# Patient Record
Sex: Male | Born: 1935 | Race: White | Hispanic: No | Marital: Married | Smoking: Former smoker
Health system: Southern US, Community
[De-identification: ages and names within clinical notes are randomized; demographics above are authoritative.]

## PROBLEM LIST (undated history)

## (undated) DIAGNOSIS — N183 Chronic kidney disease, stage 3 unspecified: Secondary | ICD-10-CM

## (undated) DIAGNOSIS — M1990 Unspecified osteoarthritis, unspecified site: Secondary | ICD-10-CM

## (undated) DIAGNOSIS — I10 Essential (primary) hypertension: Secondary | ICD-10-CM

## (undated) DIAGNOSIS — I5032 Chronic diastolic (congestive) heart failure: Secondary | ICD-10-CM

## (undated) DIAGNOSIS — E119 Type 2 diabetes mellitus without complications: Secondary | ICD-10-CM

## (undated) DIAGNOSIS — E7800 Pure hypercholesterolemia, unspecified: Secondary | ICD-10-CM

## (undated) DIAGNOSIS — R079 Chest pain, unspecified: Secondary | ICD-10-CM

## (undated) DIAGNOSIS — K5790 Diverticulosis of intestine, part unspecified, without perforation or abscess without bleeding: Secondary | ICD-10-CM

## (undated) DIAGNOSIS — Z860100 Personal history of colon polyps, unspecified: Secondary | ICD-10-CM

## (undated) DIAGNOSIS — G4730 Sleep apnea, unspecified: Secondary | ICD-10-CM

## (undated) DIAGNOSIS — J449 Chronic obstructive pulmonary disease, unspecified: Secondary | ICD-10-CM

## (undated) DIAGNOSIS — D696 Thrombocytopenia, unspecified: Secondary | ICD-10-CM

## (undated) DIAGNOSIS — I480 Paroxysmal atrial fibrillation: Secondary | ICD-10-CM

## (undated) HISTORY — PX: COLONOSCOPY: SHX174

## (undated) HISTORY — DX: Personal history of colon polyps, unspecified: Z86.0100

## (undated) HISTORY — DX: Chronic kidney disease, stage 3 (moderate): N18.3

## (undated) HISTORY — DX: Chronic obstructive pulmonary disease, unspecified: J44.9

## (undated) HISTORY — PX: CATARACT EXTRACTION: SUR2

## (undated) HISTORY — DX: Chronic kidney disease, stage 3 unspecified: N18.30

## (undated) HISTORY — DX: Unspecified osteoarthritis, unspecified site: M19.90

## (undated) HISTORY — DX: Paroxysmal atrial fibrillation: I48.0

## (undated) HISTORY — DX: Thrombocytopenia, unspecified: D69.6

## (undated) HISTORY — PX: TRIGGER FINGER RELEASE: SHX641

## (undated) HISTORY — DX: Pure hypercholesterolemia, unspecified: E78.00

## (undated) HISTORY — DX: Essential (primary) hypertension: I10

## (undated) HISTORY — DX: Type 2 diabetes mellitus without complications: E11.9

## (undated) HISTORY — PX: CARDIOVERSION: SHX1299

## (undated) HISTORY — DX: Sleep apnea, unspecified: G47.30

## (undated) HISTORY — DX: Personal history of colonic polyps: Z86.010

## (undated) HISTORY — DX: Chronic diastolic (congestive) heart failure: I50.32

## (undated) HISTORY — DX: Diverticulosis of intestine, part unspecified, without perforation or abscess without bleeding: K57.90

## (undated) HISTORY — DX: Chest pain, unspecified: R07.9

---

## 1998-07-21 HISTORY — PX: BACK SURGERY: SHX140

## 1999-05-20 ENCOUNTER — Ambulatory Visit (HOSPITAL_COMMUNITY): Admission: RE | Admit: 1999-05-20 | Discharge: 1999-05-20 | Payer: Self-pay | Admitting: Unknown Physician Specialty

## 1999-05-29 ENCOUNTER — Ambulatory Visit (HOSPITAL_COMMUNITY): Admission: RE | Admit: 1999-05-29 | Discharge: 1999-05-29 | Payer: Self-pay | Admitting: Unknown Physician Specialty

## 1999-05-29 ENCOUNTER — Encounter: Payer: Self-pay | Admitting: Unknown Physician Specialty

## 2004-11-20 ENCOUNTER — Ambulatory Visit: Payer: Self-pay | Admitting: Unknown Physician Specialty

## 2004-11-21 ENCOUNTER — Ambulatory Visit: Payer: Self-pay | Admitting: Unknown Physician Specialty

## 2005-01-06 ENCOUNTER — Ambulatory Visit: Payer: Self-pay | Admitting: Internal Medicine

## 2005-01-18 ENCOUNTER — Ambulatory Visit: Payer: Self-pay | Admitting: Internal Medicine

## 2005-02-18 ENCOUNTER — Ambulatory Visit: Payer: Self-pay | Admitting: Internal Medicine

## 2005-03-21 ENCOUNTER — Ambulatory Visit: Payer: Self-pay | Admitting: Internal Medicine

## 2005-10-28 ENCOUNTER — Other Ambulatory Visit: Payer: Self-pay

## 2005-10-28 ENCOUNTER — Inpatient Hospital Stay: Payer: Self-pay | Admitting: Internal Medicine

## 2006-04-17 ENCOUNTER — Ambulatory Visit: Payer: Self-pay | Admitting: Internal Medicine

## 2007-02-24 ENCOUNTER — Ambulatory Visit: Payer: Self-pay | Admitting: Unknown Physician Specialty

## 2007-03-03 ENCOUNTER — Ambulatory Visit: Payer: Self-pay | Admitting: Unknown Physician Specialty

## 2007-03-05 ENCOUNTER — Ambulatory Visit: Payer: Self-pay | Admitting: Unknown Physician Specialty

## 2007-03-05 ENCOUNTER — Other Ambulatory Visit: Payer: Self-pay

## 2007-03-09 ENCOUNTER — Ambulatory Visit: Payer: Self-pay | Admitting: Unknown Physician Specialty

## 2007-03-15 ENCOUNTER — Ambulatory Visit: Payer: Self-pay | Admitting: Gastroenterology

## 2007-03-25 ENCOUNTER — Ambulatory Visit: Payer: Self-pay | Admitting: Anesthesiology

## 2007-04-26 ENCOUNTER — Ambulatory Visit: Payer: Self-pay | Admitting: Anesthesiology

## 2007-05-25 ENCOUNTER — Ambulatory Visit: Payer: Self-pay | Admitting: Anesthesiology

## 2007-06-24 ENCOUNTER — Ambulatory Visit: Payer: Self-pay | Admitting: Anesthesiology

## 2007-08-05 ENCOUNTER — Ambulatory Visit: Payer: Self-pay | Admitting: Anesthesiology

## 2007-09-13 ENCOUNTER — Ambulatory Visit: Payer: Self-pay | Admitting: Anesthesiology

## 2007-10-01 ENCOUNTER — Ambulatory Visit: Payer: Self-pay | Admitting: Unknown Physician Specialty

## 2007-10-18 ENCOUNTER — Ambulatory Visit: Payer: Self-pay | Admitting: Anesthesiology

## 2007-11-17 ENCOUNTER — Ambulatory Visit (HOSPITAL_COMMUNITY): Admission: RE | Admit: 2007-11-17 | Discharge: 2007-11-17 | Payer: Self-pay | Admitting: Unknown Physician Specialty

## 2007-12-07 ENCOUNTER — Ambulatory Visit: Payer: Self-pay | Admitting: Cardiology

## 2007-12-10 ENCOUNTER — Inpatient Hospital Stay (HOSPITAL_COMMUNITY): Admission: RE | Admit: 2007-12-10 | Discharge: 2007-12-18 | Payer: Self-pay | Admitting: Neurosurgery

## 2007-12-10 ENCOUNTER — Ambulatory Visit: Payer: Self-pay | Admitting: Internal Medicine

## 2007-12-15 ENCOUNTER — Encounter (INDEPENDENT_AMBULATORY_CARE_PROVIDER_SITE_OTHER): Payer: Self-pay | Admitting: Neurosurgery

## 2008-10-17 ENCOUNTER — Ambulatory Visit: Payer: Self-pay | Admitting: Internal Medicine

## 2008-11-08 ENCOUNTER — Ambulatory Visit: Payer: Self-pay | Admitting: Internal Medicine

## 2008-12-11 ENCOUNTER — Ambulatory Visit: Payer: Self-pay | Admitting: Neurosurgery

## 2009-10-02 ENCOUNTER — Ambulatory Visit: Payer: Self-pay | Admitting: Unknown Physician Specialty

## 2010-10-04 ENCOUNTER — Ambulatory Visit: Payer: Self-pay | Admitting: Unknown Physician Specialty

## 2010-12-03 NOTE — Op Note (Signed)
NAME:  [name], [name] NO.:  [id number]   MEDICAL RECORD NO.:  [id number]          PATIENT TYPE:  INP   LOCATION:  3012                         FACILITY:  [facility]   PHYSICIAN:  Coletta Memos, M.D.     DATE OF BIRTH:  1935-04-12   DATE OF PROCEDURE:  12/10/2007  DATE OF DISCHARGE:                               OPERATIVE REPORT   PREOPERATIVE DIAGNOSES:  Lumbar stenosis L4-L5, lumbar instability L4-  L5.   POSTOPERATIVE DIAGNOSES:  Lumbar stenosis L4-L5, lumbar instability L4-  L5, and lumbar spondylosis without myelopathy.   SURGERIES:  1. Posterior lumbar interbody arthrodesis, 12-mm __________  cages      packed with morselized allograft and with infuse.  2. Posterolateral arthrodesis using morselized allograft and infused.  3. Spire plate placement.   COMPLICATIONS:  None.   SURGEON:  Coletta Memos, MD   ASSISTANT:  Danae Orleans. Venetia Maxon, MD   ANESTHESIA:  General endotracheal.   INDICATIONS:  [name] is a 76 year old gentleman who presented  with severe pain in hips and legs.  He had little in the way of back  pain.  MRI showed a large mass causing rather severe spinal stenosis,  the mass, I presumed to be thickened ligament, possibly a synovial cyst.  He also had fishmouth instability on the L4-L5 interspace.  I,  therefore, recommended that he undergo decompression with possible  arthrodesis and as it turned out, he indeed needed it.  He is admitted  today for that operation.   OPERATIVE NOTE:  [name] was brought to the operating room, intubated  and placed under general anesthetic without difficulty.  A Foley  catheter was placed under sterile conditions.  He was rolled prone onto  a Jackson table and all pressure points were properly padded.  His back  was prepped, and he was draped in a sterile fashion.  I infiltrated  lidocaine 1:200,000 __________  epinephrine into the lumbar region.  I  then opened an incision with a #10 blade and took this  down to the  thoracolumbar fascia.  I then exposed the lamina of L3, L4, and L5  confirmed by intraoperative view.  I then proceeded with my semi-  hemilaminectomy of the L4 bilaterally.   I performed a decompression of the L4-L5 disk space by performing semi-  hemilaminectomies bilaterally using a high-speed drill and Kerrison  punches of L4.  The wall was very thick and partially calcified  ligamentum flavum, which was able to decompress the spinal canal.  After  having done that, I opened the disk spaces bilaterally and prepared the  disk space for arthrodesis by scraping down the soft tissue and disk  material along with some of the endplate to make sure I had very good  noncortical surfaces.  I then packed two 12-mm cages with morselized  allograft.  I placed infuse into the intervertebral disk space then  placed the cages along with more morselized allograft .   I then placed a spire plate without difficulty.  X-ray showed that the  interbody cages were in good position.  I then  irrigated the wound.  I  then closed the wound in layered fashion using Vicryl sutures.  I  reapproximated thoracolumbar subcutaneous subcuticular layers.  Dr.  Venetia Maxon assisted with the decompression and placement of the cages and  spire plate.  I then closed the wound in layered fashion.  Sterile  dressing was applied.           ______________________________  Coletta Memos, M.D.     KC/MEDQ  D:  12/10/2007  T:  12/11/2007  Job:  [id number]

## 2010-12-06 NOTE — Discharge Summary (Signed)
[name], [name] NO.:  [id number]   MEDICAL RECORD NO.:  [id number]          PATIENT TYPE:  INP   LOCATION:  3012                         FACILITY:  [facility]   PHYSICIAN:  Coletta Memos, M.D.     DATE OF BIRTH:  [birth year]   DATE OF ADMISSION:  12/10/2007  DATE OF DISCHARGE:  12/18/2007                               DISCHARGE SUMMARY   ADMITTING DIAGNOSES:  1. Lumbar spondylosis.  2. Low back pain.  3. Degenerative disk disease.  4. Lumbar stenosis L4-5.   DISCHARGE DIAGNOSES:  1. Lumbar spondylosis.  2. Low back pain.  3. Degenerative disk disease.  4. Lumbar stenosis L4-5.   PROCEDURE:  Posterior lumbar interbody arthrodesis L4-5 with Spire plate  placement and PEEK implants.   COMPLICATIONS:  Urinary retention postoperatively.   [name] was admitted and taken to the operating room for lumbar  stenosis and spondylosis along with low back pain.  He did well with  regards to his back and lower extremity pain, but developed urinary  retention while in the hospital.  That prolonged his stay.  At  discharge, we were able to remove the Foley catheter.  He was able to  void on his own.  He did have some evidence also of acute renal failure  which resolved over the course of his hospitalization with increased  hydration.  Creatinine went up to as high as 5.7.  It did decrease, and  at discharge, he was voiding without difficulty, and laboratories were  trending downwards.  He will have follow up with his internal medicine  physician with regards to this.  His wound at discharge is clean and  dry.  No signs of infection.  There was 5/5 strength in the lower  extremities.  He was given pain medication at discharge along with a  muscle relaxant.           ______________________________  Coletta Memos, M.D.     KC/MEDQ  D:  01/14/2008  T:  01/15/2008  Job:  [id number]

## 2011-04-16 LAB — BASIC METABOLIC PANEL
BUN: 12
BUN: 32 — ABNORMAL HIGH
BUN: 44 — ABNORMAL HIGH
CO2: 22
CO2: 29
Calcium: 8.2 — ABNORMAL LOW
Calcium: 8.5
Chloride: 102
Chloride: 103
Chloride: 103
Chloride: 105
Creatinine, Ser: 2.07 — ABNORMAL HIGH
Creatinine, Ser: 5.11 — ABNORMAL HIGH
GFR calc Af Amer: 12 — ABNORMAL LOW
GFR calc non Af Amer: 10 — ABNORMAL LOW
GFR calc non Af Amer: 11 — ABNORMAL LOW
GFR calc non Af Amer: 12 — ABNORMAL LOW
GFR calc non Af Amer: 60 — ABNORMAL LOW
Glucose, Bld: 165 — ABNORMAL HIGH
Glucose, Bld: 236 — ABNORMAL HIGH
Glucose, Bld: 56 — ABNORMAL LOW
Glucose, Bld: 64 — ABNORMAL LOW
Potassium: 4.3
Potassium: 4.4
Potassium: 4.8
Sodium: 131 — ABNORMAL LOW
Sodium: 138
Sodium: 139

## 2011-04-16 LAB — CBC
HCT: 26.6 — ABNORMAL LOW
HCT: 40.5
Hemoglobin: 14
Hemoglobin: 9.8 — ABNORMAL LOW
MCHC: 34.5
MCV: 89.6
MCV: 90.3
Platelets: 149 — ABNORMAL LOW
Platelets: 172
Platelets: 183
RBC: 2.94 — ABNORMAL LOW
RBC: 3.13 — ABNORMAL LOW
RDW: 13.7
RDW: 14
RDW: 14.2
WBC: 4.6
WBC: 8.5

## 2011-04-16 LAB — URINALYSIS, ROUTINE W REFLEX MICROSCOPIC
Nitrite: NEGATIVE
Specific Gravity, Urine: 1.019
Urobilinogen, UA: 1
pH: 5

## 2011-04-16 LAB — URINE MICROSCOPIC-ADD ON

## 2011-04-16 LAB — ABO/RH: ABO/RH(D): O POS

## 2011-04-16 LAB — TYPE AND SCREEN

## 2011-07-01 ENCOUNTER — Ambulatory Visit: Payer: Self-pay | Admitting: Internal Medicine

## 2011-10-21 ENCOUNTER — Ambulatory Visit: Payer: Self-pay | Admitting: Gastroenterology

## 2011-10-21 LAB — CBC WITH DIFFERENTIAL/PLATELET
Basophil #: 0 10*3/uL (ref 0.0–0.1)
Basophil %: 0.5 %
Eosinophil %: 0.9 %
HCT: 39.1 % — ABNORMAL LOW (ref 40.0–52.0)
MCV: 93 fL (ref 80–100)
Monocyte #: 0.9 10*3/uL — ABNORMAL HIGH (ref 0.0–0.7)
Monocyte %: 15.5 %
Neutrophil %: 68.9 %
Platelet: 104 10*3/uL — ABNORMAL LOW (ref 150–440)
RBC: 4.2 10*6/uL — ABNORMAL LOW (ref 4.40–5.90)
RDW: 13.5 % (ref 11.5–14.5)
WBC: 5.7 10*3/uL (ref 3.8–10.6)

## 2011-12-16 ENCOUNTER — Ambulatory Visit: Payer: Self-pay | Admitting: Oncology

## 2011-12-16 LAB — CBC CANCER CENTER
Basophil #: 0 x10 3/mm (ref 0.0–0.1)
Basophil %: 0.3 %
Lymphocyte #: 0.9 x10 3/mm — ABNORMAL LOW (ref 1.0–3.6)
Lymphocyte %: 16.1 %
MCV: 91 fL (ref 80–100)
Monocyte #: 1.2 x10 3/mm — ABNORMAL HIGH (ref 0.2–1.0)
Neutrophil #: 3.5 x10 3/mm (ref 1.4–6.5)
Platelet: 107 x10 3/mm — ABNORMAL LOW (ref 150–440)
RBC: 4.17 10*6/uL — ABNORMAL LOW (ref 4.40–5.90)
WBC: 5.8 x10 3/mm (ref 3.8–10.6)

## 2011-12-16 LAB — FERRITIN: Ferritin (ARMC): 52 ng/mL (ref 8–388)

## 2011-12-16 LAB — LACTATE DEHYDROGENASE: LDH: 285 U/L — ABNORMAL HIGH (ref 87–241)

## 2011-12-16 LAB — IRON AND TIBC
Iron Bind.Cap.(Total): 349 ug/dL (ref 250–450)
Iron Saturation: 28 %

## 2011-12-17 LAB — PROT IMMUNOELECTROPHORES(ARMC)

## 2011-12-20 ENCOUNTER — Ambulatory Visit: Payer: Self-pay | Admitting: Oncology

## 2012-02-06 ENCOUNTER — Ambulatory Visit: Payer: Self-pay | Admitting: Ophthalmology

## 2012-02-06 DIAGNOSIS — Z01810 Encounter for preprocedural cardiovascular examination: Secondary | ICD-10-CM

## 2012-02-06 LAB — CBC WITH DIFFERENTIAL/PLATELET
Basophil %: 0.6 %
Eosinophil %: 1.9 %
HGB: 12.8 g/dL — ABNORMAL LOW (ref 13.0–18.0)
Lymphocyte #: 1 10*3/uL (ref 1.0–3.6)
MCH: 32.2 pg (ref 26.0–34.0)
MCV: 93 fL (ref 80–100)
Monocyte #: 1.4 x10 3/mm — ABNORMAL HIGH (ref 0.2–1.0)
Platelet: 113 10*3/uL — ABNORMAL LOW (ref 150–440)
RBC: 3.99 10*6/uL — ABNORMAL LOW (ref 4.40–5.90)
RDW: 14.1 % (ref 11.5–14.5)

## 2012-02-06 LAB — POTASSIUM: Potassium: 3.7 mmol/L (ref 3.5–5.1)

## 2012-02-16 ENCOUNTER — Ambulatory Visit: Payer: Self-pay | Admitting: Ophthalmology

## 2012-03-16 ENCOUNTER — Ambulatory Visit: Payer: Self-pay | Admitting: Gastroenterology

## 2012-06-01 ENCOUNTER — Telehealth: Payer: Self-pay | Admitting: Ophthalmology

## 2012-06-01 NOTE — Telephone Encounter (Signed)
What meds does he need.  What is he out of.  i can work on getting him an earlier appt.

## 2012-06-01 NOTE — Telephone Encounter (Signed)
Pt is set up in February to see you he was wanting to come in sooner because he says his medication is out dated and needs to speak with you about his meds.

## 2012-06-01 NOTE — Telephone Encounter (Signed)
Called patient Dr [name]. Has been getting his meds from [place].

## 2012-06-03 NOTE — Telephone Encounter (Signed)
Need to notify pt that he needs to get the lyrica refilled by md who prescribed med - until appt with me.  Ok to send message back to [place] after pt notified - to work on earlier appt.  Will need at least 30 min.

## 2012-06-04 NOTE — Telephone Encounter (Signed)
Called patient to let him know. Patient stated that Dr. Hyacinth Meeker gave him 2 refills. He is wanting a sooner appointment if he can be worked in.

## 2012-06-21 ENCOUNTER — Emergency Department: Payer: Self-pay | Admitting: Emergency Medicine

## 2012-06-21 ENCOUNTER — Ambulatory Visit: Payer: Self-pay | Admitting: Emergency Medicine

## 2012-06-21 LAB — COMPREHENSIVE METABOLIC PANEL
Alkaline Phosphatase: 113 U/L (ref 50–136)
BUN: 18 mg/dL (ref 7–18)
Co2: 27 mmol/L (ref 21–32)
Creatinine: 1.29 mg/dL (ref 0.60–1.30)
EGFR (African American): >60
EGFR (Non-African Amer.): 54 — ABNORMAL LOW
Glucose: 206 mg/dL — ABNORMAL HIGH (ref 65–99)
Potassium: 3.7 mmol/L (ref 3.5–5.1)
SGOT(AST): 22 U/L (ref 15–37)
SGPT (ALT): 30 U/L (ref 12–78)
Sodium: 140 mmol/L (ref 136–145)

## 2012-06-21 LAB — CBC
HCT: 36.2 % — ABNORMAL LOW (ref 40.0–52.0)
HGB: 12.6 g/dL — ABNORMAL LOW (ref 13.0–18.0)
MCH: 32.2 pg (ref 26.0–34.0)
MCV: 92 fL (ref 80–100)
Platelet: 104 10*3/uL — ABNORMAL LOW (ref 150–440)
RDW: 14.2 % (ref 11.5–14.5)
WBC: 8 10*3/uL (ref 3.8–10.6)

## 2012-06-24 ENCOUNTER — Emergency Department: Payer: Self-pay | Admitting: Internal Medicine

## 2012-06-24 LAB — PRO B NATRIURETIC PEPTIDE: B-Type Natriuretic Peptide: 414 pg/mL (ref 0–450)

## 2012-06-24 LAB — CBC
HCT: 32.2 % — ABNORMAL LOW (ref 40.0–52.0)
MCH: 31.8 pg (ref 26.0–34.0)
MCHC: 34.6 g/dL (ref 32.0–36.0)
MCV: 92 fL (ref 80–100)
Platelet: 116 10*3/uL — ABNORMAL LOW (ref 150–440)
RDW: 13.7 % (ref 11.5–14.5)
WBC: 6 10*3/uL (ref 3.8–10.6)

## 2012-06-24 LAB — COMPREHENSIVE METABOLIC PANEL
Albumin: 3.3 g/dL — ABNORMAL LOW (ref 3.4–5.0)
Anion Gap: 8 (ref 7–16)
BUN: 33 mg/dL — ABNORMAL HIGH (ref 7–18)
Bilirubin,Total: 0.5 mg/dL (ref 0.2–1.0)
Creatinine: 2.11 mg/dL — ABNORMAL HIGH (ref 0.60–1.30)
Glucose: 167 mg/dL — ABNORMAL HIGH (ref 65–99)
Osmolality: 292 (ref 275–301)
Potassium: 3.8 mmol/L (ref 3.5–5.1)
Sodium: 141 mmol/L (ref 136–145)
Total Protein: 6.3 g/dL — ABNORMAL LOW (ref 6.4–8.2)

## 2012-06-24 LAB — URINALYSIS, COMPLETE
Bilirubin,UR: NEGATIVE
Hyaline Cast: 3
Leukocyte Esterase: NEGATIVE
Ph: 5 (ref 4.5–8.0)
Protein: 30
RBC,UR: 1 /HPF (ref 0–5)
Specific Gravity: 1.009 (ref 1.003–1.030)
Squamous Epithelial: NONE SEEN
WBC UR: 23 /HPF (ref 0–5)

## 2012-06-24 LAB — TROPONIN I: Troponin-I: <0.02 ng/mL

## 2012-07-29 ENCOUNTER — Other Ambulatory Visit: Payer: Self-pay | Admitting: Internal Medicine

## 2012-07-29 NOTE — Telephone Encounter (Signed)
If has been on these regularly = then ok to refill until appt

## 2012-07-29 NOTE — Telephone Encounter (Signed)
Patient hasn't been seen in our office yet, appt 09/09/2012 please advise.

## 2012-09-09 ENCOUNTER — Encounter: Payer: Self-pay | Admitting: Internal Medicine

## 2012-09-09 ENCOUNTER — Other Ambulatory Visit: Payer: Self-pay | Admitting: Internal Medicine

## 2012-09-09 ENCOUNTER — Ambulatory Visit (INDEPENDENT_AMBULATORY_CARE_PROVIDER_SITE_OTHER): Payer: Medicare Other | Admitting: Internal Medicine

## 2012-09-09 VITALS — BP 138/82 | HR 93 | Temp 97.7°F | Resp 17 | Ht 72.75 in | Wt 291.2 lb

## 2012-09-09 DIAGNOSIS — E7800 Pure hypercholesterolemia, unspecified: Secondary | ICD-10-CM

## 2012-09-09 DIAGNOSIS — R0602 Shortness of breath: Secondary | ICD-10-CM

## 2012-09-09 DIAGNOSIS — E0821 Diabetes mellitus due to underlying condition with diabetic nephropathy: Secondary | ICD-10-CM

## 2012-09-09 DIAGNOSIS — N058 Unspecified nephritic syndrome with other morphologic changes: Secondary | ICD-10-CM

## 2012-09-09 DIAGNOSIS — E119 Type 2 diabetes mellitus without complications: Secondary | ICD-10-CM

## 2012-09-09 DIAGNOSIS — D696 Thrombocytopenia, unspecified: Secondary | ICD-10-CM

## 2012-09-09 DIAGNOSIS — E1329 Other specified diabetes mellitus with other diabetic kidney complication: Secondary | ICD-10-CM

## 2012-09-09 DIAGNOSIS — I10 Essential (primary) hypertension: Secondary | ICD-10-CM

## 2012-09-09 DIAGNOSIS — I4891 Unspecified atrial fibrillation: Secondary | ICD-10-CM

## 2012-09-09 DIAGNOSIS — J441 Chronic obstructive pulmonary disease with (acute) exacerbation: Secondary | ICD-10-CM

## 2012-09-09 DIAGNOSIS — N289 Disorder of kidney and ureter, unspecified: Secondary | ICD-10-CM

## 2012-09-09 LAB — CBC WITH DIFFERENTIAL/PLATELET
Basophils Absolute: 0 10*3/uL (ref 0.0–0.1)
Eosinophils Absolute: 0.1 10*3/uL (ref 0.0–0.7)
Eosinophils Relative: 1.2 % (ref 0.0–5.0)
HCT: 36 % — ABNORMAL LOW (ref 39.0–52.0)
Lymphs Abs: 0.9 10*3/uL (ref 0.7–4.0)
MCHC: 34.3 g/dL (ref 30.0–36.0)
MCV: 93 fl (ref 78.0–100.0)
Monocytes Absolute: 1.6 10*3/uL — ABNORMAL HIGH (ref 0.1–1.0)
Neutrophils Relative %: 57.1 % (ref 43.0–77.0)
Platelets: 116 10*3/uL — ABNORMAL LOW (ref 150.0–400.0)
RDW: 15 % — ABNORMAL HIGH (ref 11.5–14.6)

## 2012-09-09 LAB — URINALYSIS, ROUTINE W REFLEX MICROSCOPIC
Bilirubin Urine: NEGATIVE
Leukocytes, UA: NEGATIVE
Nitrite: NEGATIVE
Specific Gravity, Urine: 1.015 (ref 1.000–1.030)
pH: 6 (ref 5.0–8.0)

## 2012-09-09 LAB — HEMOGLOBIN A1C: Hgb A1c MFr Bld: 8.5 % — ABNORMAL HIGH (ref 4.6–6.5)

## 2012-09-09 LAB — BASIC METABOLIC PANEL
CO2: 29 mEq/L (ref 19–32)
Chloride: 106 mEq/L (ref 96–112)
Glucose, Bld: 315 mg/dL — ABNORMAL HIGH (ref 70–99)
Sodium: 140 mEq/L (ref 135–145)

## 2012-09-09 LAB — MICROALBUMIN / CREATININE URINE RATIO: Microalb, Ur: 14.7 mg/dL — ABNORMAL HIGH (ref 0.0–1.9)

## 2012-09-09 LAB — HEPATIC FUNCTION PANEL
Albumin: 3.9 g/dL (ref 3.5–5.2)
Total Bilirubin: 0.8 mg/dL (ref 0.3–1.2)

## 2012-09-09 MED ORDER — FUROSEMIDE 20 MG PO TABS: ORAL_TABLET | ORAL | Status: DC

## 2012-09-09 MED ORDER — DILTIAZEM HCL ER COATED BEADS 120 MG PO CP24: 120.0000 mg | ORAL_CAPSULE | Freq: Every day | ORAL | Status: DC

## 2012-09-10 ENCOUNTER — Ambulatory Visit (INDEPENDENT_AMBULATORY_CARE_PROVIDER_SITE_OTHER): Payer: Medicare Other | Admitting: Cardiovascular Disease

## 2012-09-10 ENCOUNTER — Encounter: Payer: Self-pay | Admitting: Internal Medicine

## 2012-09-10 ENCOUNTER — Encounter: Payer: Self-pay | Admitting: Cardiovascular Disease

## 2012-09-10 ENCOUNTER — Other Ambulatory Visit: Payer: Self-pay | Admitting: Internal Medicine

## 2012-09-10 VITALS — BP 140/82 | HR 99 | Ht 72.0 in | Wt 290.2 lb

## 2012-09-10 DIAGNOSIS — N289 Disorder of kidney and ureter, unspecified: Secondary | ICD-10-CM

## 2012-09-10 DIAGNOSIS — E7800 Pure hypercholesterolemia, unspecified: Secondary | ICD-10-CM | POA: Insufficient documentation

## 2012-09-10 DIAGNOSIS — I10 Essential (primary) hypertension: Secondary | ICD-10-CM | POA: Insufficient documentation

## 2012-09-10 DIAGNOSIS — R0602 Shortness of breath: Secondary | ICD-10-CM

## 2012-09-10 DIAGNOSIS — D696 Thrombocytopenia, unspecified: Secondary | ICD-10-CM | POA: Insufficient documentation

## 2012-09-10 DIAGNOSIS — I4891 Unspecified atrial fibrillation: Secondary | ICD-10-CM | POA: Insufficient documentation

## 2012-09-10 DIAGNOSIS — N184 Chronic kidney disease, stage 4 (severe): Secondary | ICD-10-CM | POA: Insufficient documentation

## 2012-09-10 DIAGNOSIS — N058 Unspecified nephritic syndrome with other morphologic changes: Secondary | ICD-10-CM

## 2012-09-10 DIAGNOSIS — E0821 Diabetes mellitus due to underlying condition with diabetic nephropathy: Secondary | ICD-10-CM

## 2012-09-10 DIAGNOSIS — E1329 Other specified diabetes mellitus with other diabetic kidney complication: Secondary | ICD-10-CM

## 2012-09-10 DIAGNOSIS — J441 Chronic obstructive pulmonary disease with (acute) exacerbation: Secondary | ICD-10-CM

## 2012-09-10 DIAGNOSIS — G629 Polyneuropathy, unspecified: Secondary | ICD-10-CM

## 2012-09-10 DIAGNOSIS — G609 Hereditary and idiopathic neuropathy, unspecified: Secondary | ICD-10-CM

## 2012-09-10 DIAGNOSIS — D649 Anemia, unspecified: Secondary | ICD-10-CM

## 2012-09-10 MED ORDER — FUROSEMIDE 20 MG PO TABS: ORAL_TABLET | ORAL | Status: DC

## 2012-09-10 NOTE — Progress Notes (Signed)
Patient ID: [name], male    DOB: January 04, 1935, 78 y.o.   MRN: [id number]  HPI Comments: 78 year old male with past history of hypertension, hypercholesterolemia, diabetes with renal insufficiency, thrombocytopenia who presents by referral from Dr. Lorin Picket for new atrial fibrillation.  He reports having history of COPD, obstructive sleep apnea, on CPAP. He smoked for 25 years, stopped 30 years ago. Over the past 6-8 months he has had increasing shortness of breath, particularly bad at the end of the summer. He has had worsening edema starting at the beginning of the summer which she attributes to lyrica. He denies any cough that has been more wheezy. He has had more difficulty working at [employer] as he has to walk and lift heavy items and frequently has to rest. Rare palpitations otherwise does not appreciate any tachycardia. Weight has increased over the past several months.  EKG shows atrial fibrillation with ventricular rate 99 beats per minute Echocardiogram 5 years ago showed moderate LVH otherwise essentially normal study.   Past Medical History . Arthritis  . 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 Sleep apnea  . COPD (chronic obstructive pulmonary disease)  . Hypercholesterolemia  . Hypertension  . Thrombocytopenia  . Renal insufficiency  . Diverticulosis  . History of colon polyps     Outpatient Encounter Prescriptions as of 09/10/2012  Medication Sig Dispense Refill  . atenolol (TENORMIN) 50 MG tablet Take 1 tablet by mouth daily.      Marland Kitchen atorvastatin (LIPITOR) 20 MG tablet Take 1 tablet by mouth daily.      Marland Kitchen diltiazem (CARDIZEM CD) 120 MG 24 hr capsule Take 1 capsule (120 mg total) by mouth daily.  30 capsule  3  . escitalopram (LEXAPRO) 10 MG tablet TAKE 1/2 TABLET BY MOUTH ONCE A DAY  15 tablet  2  . furosemide (LASIX) 20 MG tablet One tablet bid  60 tablet  3  . glyBURIDE (DIABETA) 5 MG tablet Take 5 mg by mouth 2 (two) times daily with a meal.        . HUMALOG 100 UNIT/ML injection Take 26 units in the am and 20 units in the pm.      . JANUVIA 100 MG tablet TAKE 1 TABLET BY MOUTH ONCE A DAY  30 tablet  0  . LANTUS 100 UNIT/ML injection Take 26 units in the am and 46 units in the pm.      . levalbuterol (XOPENEX HFA) 45 MCG/ACT inhaler Inhale 2 puffs into the lungs every 4 (four) hours as needed for wheezing.      . Multiple Vitamin (MULTIVITAMIN) tablet Take 1 tablet by mouth daily.      Marland Kitchen omeprazole (PRILOSEC) 20 MG capsule Take 20 mg by mouth daily.      . pregabalin (LYRICA) 75 MG capsule Take 75 mg by mouth 2 (two) times daily.      . Tamsulosin HCl (FLOMAX) 0.4 MG CAPS Take 0.4 mg by mouth daily.         Review of Systems  Constitutional: Positive for unexpected weight change.  HENT: Negative.   Eyes: Negative.   Respiratory: Positive for shortness of breath.   Cardiovascular: Positive for palpitations and leg swelling.  Gastrointestinal: Negative.   Musculoskeletal: Negative.   Skin: Negative.   Neurological: Negative.   Psychiatric/Behavioral: Negative.   All other systems reviewed and are negative.    BP 140/82  Pulse 99  Ht 6' (1.829 m)  [employer]  290 lb 4 oz (131.657 kg)  BMI 39.36 kg/m2  Physical Exam  Nursing note and vitals reviewed. Constitutional: He is oriented to person, place, and time. He appears well-developed and well-nourished.  obese  HENT:  Head: Normocephalic.  Nose: Nose normal.  Mouth/Throat: Oropharynx is clear and moist.  Eyes: Conjunctivae are normal. Pupils are equal, round, and reactive to light.  Neck: Normal range of motion. Neck supple. No JVD present.  Cardiovascular: Normal rate, S1 normal, S2 normal, normal heart sounds and intact distal pulses.  An irregularly irregular rhythm present. Exam reveals no gallop and no friction rub.   No murmur heard. Pulmonary/Chest: Effort normal and breath sounds normal. No respiratory distress. He has no wheezes. He has no rales. He exhibits no  tenderness.  Abdominal: Soft. Bowel sounds are normal. He exhibits no distension. There is no tenderness.  Musculoskeletal: Normal range of motion. He exhibits no edema and no tenderness.  Lymphadenopathy:    He has no cervical adenopathy.  Neurological: He is alert and oriented to person, place, and time. Coordination normal.  Skin: Skin is warm and dry. No rash noted. No erythema.  Psychiatric: He has a normal mood and affect. His behavior is normal. Judgment and thought content normal.      Assessment and Plan

## 2012-09-10 NOTE — Progress Notes (Signed)
Order placed for follow up labs 

## 2012-09-10 NOTE — Assessment & Plan Note (Signed)
Has a history of thrombocytopenia.  Recheck cbc today.  Will have to obtain records from [place] to review previous work up.

## 2012-09-10 NOTE — Assessment & Plan Note (Signed)
Blood pressure has been under good control.  Starting on diltiazem and lasix as outlined.  Will hold hctz and lisinopril.  Follow pressures.  Will add back lisinopril as blood pressure tolerated - especially given his underlying renal insufficiency and history of diabetes.

## 2012-09-10 NOTE — Assessment & Plan Note (Signed)
New onset afib.  Probably has been in afib for a while.  Has had progressive weight gain and SOB/DOE.  Ventricular rate resting - approximately 100.  Will continue Atenolol.  Will hold on increasing the atenolol - given his underlying lung issues.  Has COPD.  Will add diltiazem 120mg  q day.  Start lasix 20mg  bid.  Will hold hctz and lisinopril for now.  Monitor blood pressure closely.  Start xarelto 20mg  q day.  Cardiology (Dr Mariah Milling) agreed to work pt in tomorrow.  Pt aware of appt.  Further cardiac w/up per Dr Windell Hummingbird assessment.  Pt comfortable with this plan.  Knows if he has any worsening problems, he is to be reevaluated immediately.

## 2012-09-10 NOTE — Assessment & Plan Note (Signed)
I suspect he has been in atrial fibrillation for some time given worsening shortness of breath and edema for months. We have suggested he stay on anticoagulation, xarelto 20 mg daily. We have increased the Lasix to 20 mg twice a day for his pitting edema and shortness of breath. We will continue on diltiazem 120 mg daily, continue atenolol 50 mg daily. Rate continues to be around 100 beats per minute at rest. I've asked him to monitor his heart rate while at work and at home. If rate continues to be elevated, we'll probably recommend increasing atenolol to 50 mg twice a day.

## 2012-09-10 NOTE — Assessment & Plan Note (Signed)
On simvastatin.  Check liver panel today.  Has eaten.  Will check lipid profile with next fasting labs. Low cholesterol diet and exercise.

## 2012-09-10 NOTE — Progress Notes (Signed)
  Subjective:    Patient ID: [name], male    DOB: [birth year], 78 y.o.   MRN: [id number]  HPI 78 year old male with past history of hypertension, hypercholesterolemia, diabetes with renal insufficiency (previously followed by Dr Hyacinth Meeker) and thrombocytopenia who comes in today for a scheduled follow up.  I last saw him at [facility] (4/14).  He reports he has gained weight.  Weight previously 255-260 pounds.  Reports increased swelling.  States this started after starting Lyrica.  Had peripheral neuropathy.  Previously experienced increased burning and stinging of his feet. Since starting the lyrica - essentially resolved.  He continues to work.  Stands on his feet all day.  If he walks any distance, reports increased sob.  Has previously seen Dr Meredeth Ide.  Has a history of documented COPD.  Has not kept follow up with Dr Meredeth Ide - stating he is afraid he will put him on oxygen.  Denies any chest pain.  No increased heart racing or palpitations.  Reports blood sugars averaging 90-140 in the am and 130-140 in the pm.  Brought in no recorded sugar readings.  States blood pressure averaging 120-130/70s.  No bowel change.    Past Medical History  Diagnosis Date  . Arthritis   . 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 Sleep apnea   . COPD (chronic obstructive pulmonary disease)   . Hypercholesterolemia   . Hypertension   . Thrombocytopenia   . Renal insufficiency   . Diverticulosis   . History of colon polyps     Review of Systems Patient denies any headache, lightheadedness or dizziness.  No sinus or allergy symptoms.  No chest pain or palpitations.  No increased heart rate.   No cough or congestion.  Does report increased sob and windedness with minimal exertion.   No nausea or vomiting.  No abdominal pain or cramping.  No bowel change, such as diarrhea, constipation, BRBPR or melana.  No urine change.   The stinging and burning of his feet improved.  Still seeing Dr  Franky Macho for his back.  Had MRI.       Objective:   Physical Exam Filed Vitals:   09/09/12 1158  BP: 138/82  Pulse: 93  Temp: 97.7 F (36.5 C)  Resp: 82   78 year old male in no acute distress.  HEENT:  Nares - clear.  Oropharynx - without lesions. NECK:  Supple.  Nontender.  No audible carotid bruit.  HEART:  Appears irregularly irregular.  Rate controlled.    LUNGS:  No crackles or wheezing audible.  Respirations even and unlabored.   RADIAL PULSE:  Equal bilaterally.  ABDOMEN:  Soft.  Nontender.  Bowel sounds present and normal.  No audible abdominal bruit.    EXTREMITIES:  Some edema noted.  DP pulses palpable and equal bilaterally.  SKIN:  No lesions.        Assessment & Plan:  MSK.  Stable.    HEALTH MAINTENANCE.  Last physical 10/30/11.  Prostate and PSAs through Dr Achilles Dunk.  Colonoscopy 10/21/11 - diverticulosis and several polyps.    I spent over one hour with this patient.  More than 50% of the time was spent in consultation regarding the above.

## 2012-09-10 NOTE — Assessment & Plan Note (Addendum)
Recent creatinine 1.4. Echocardiogram pending to evaluate his right ventricular systolic pressures inside diuresis. For now we'll increase Lasix to 20 mg twice a day.he would prefer to eat a banana rather than a potassium pill.

## 2012-09-10 NOTE — Assessment & Plan Note (Signed)
Sugars as outlined.  Brought in no recorded readings.  Check metabolic panel and a1c.  Check urinalysis and urine microalbumin/cr ratio.  Same medications for now.

## 2012-09-10 NOTE — Assessment & Plan Note (Signed)
Documented history of COPD.   Has seen Dr Meredeth Ide.  Has not kept follow up.  Obtain records.  Treat underlying heart issues as outlined.

## 2012-09-10 NOTE — Assessment & Plan Note (Signed)
We have encouraged continued exercise, careful diet management in an effort to lose weight. 

## 2012-09-10 NOTE — Assessment & Plan Note (Signed)
Unable to exclude COPD exacerbation given underlying wheezing. He does have Xopenex inhaler. Suspect some heart failure, diastolic CHF from underlying atrial fibrillation could be exacerbating symptoms.

## 2012-09-10 NOTE — Assessment & Plan Note (Signed)
Baseline creatinine around 1.4.  Check metabolic panel today.  Avoid antiinflammatories and other nephrotoxic agents.

## 2012-09-10 NOTE — Patient Instructions (Addendum)
Please take lasix 20 mg twice a day with potassium  Continue to take diltiazem and atenolol for heart rate control Continue on xarelto one a day  In four weeks , we can consider options to convert the rhythm back to normal rhythm   We will schedule an echo for atrial fibrillation  Please call us if you have new issues that need to be addressed before your next appt.  Your physician wants you to follow-up in: 1 months.

## 2012-09-10 NOTE — Assessment & Plan Note (Signed)
We'll monitor his blood pressure on his current medications.

## 2012-09-14 ENCOUNTER — Other Ambulatory Visit: Payer: Self-pay

## 2012-09-14 ENCOUNTER — Other Ambulatory Visit: Payer: Self-pay | Admitting: *Deleted

## 2012-09-14 ENCOUNTER — Other Ambulatory Visit (INDEPENDENT_AMBULATORY_CARE_PROVIDER_SITE_OTHER): Payer: Medicare Other

## 2012-09-14 DIAGNOSIS — I4891 Unspecified atrial fibrillation: Secondary | ICD-10-CM

## 2012-09-14 DIAGNOSIS — R0602 Shortness of breath: Secondary | ICD-10-CM

## 2012-09-14 MED ORDER — ATENOLOL 50 MG PO TABS: 50.0000 mg | ORAL_TABLET | Freq: Every day | ORAL | Status: DC

## 2012-09-16 ENCOUNTER — Other Ambulatory Visit: Payer: Self-pay | Admitting: Internal Medicine

## 2012-09-17 ENCOUNTER — Telehealth: Payer: Self-pay | Admitting: *Deleted

## 2012-09-17 MED ORDER — GLYBURIDE 5 MG PO TABS: 5.0000 mg | ORAL_TABLET | Freq: Two times a day (BID) | ORAL | Status: DC

## 2012-09-17 NOTE — Telephone Encounter (Signed)
Needs refill for Glyburide 5 mg tab take 2 tablets by mouth 2 daily, [facility] [place]

## 2012-09-17 NOTE — Telephone Encounter (Signed)
Sent in to pharmacy.  

## 2012-09-18 ENCOUNTER — Telehealth: Payer: Self-pay | Admitting: Internal Medicine

## 2012-09-18 MED ORDER — GLYBURIDE 5 MG PO TABS: ORAL_TABLET | ORAL | Status: DC

## 2012-09-18 NOTE — Telephone Encounter (Signed)
Corrected dosage. 2 tablets bid.  Clarified with pharmacy.

## 2012-09-21 ENCOUNTER — Telehealth: Payer: Self-pay | Admitting: *Deleted

## 2012-09-21 NOTE — Telephone Encounter (Signed)
Please review echo results Tachycardic during test

## 2012-09-21 NOTE — Telephone Encounter (Signed)
Pt calling for test results.

## 2012-09-28 ENCOUNTER — Telehealth: Payer: Self-pay | Admitting: *Deleted

## 2012-09-28 NOTE — Telephone Encounter (Signed)
Refill Request  Lisinopril 40 mg  #90  Take 1 tablet by mouth once a day for blood pressure

## 2012-09-28 NOTE — Telephone Encounter (Signed)
Normal LV function Moderately dilated left atrium which will make it difficult to get him out of atrial fibrillation  If heart rate continues to be elevated, would increase atenolol to 50 mg twice a day Stay on xarelto Daily Could consider starting a medication for cardioversion at the end of March Could set up an appointment to talk about this

## 2012-09-29 ENCOUNTER — Other Ambulatory Visit: Payer: Self-pay

## 2012-09-29 ENCOUNTER — Telehealth: Payer: Self-pay

## 2012-09-29 MED ORDER — RIVAROXABAN 20 MG PO TABS: 20.0000 mg | ORAL_TABLET | Freq: Every day | ORAL | Status: DC

## 2012-09-29 NOTE — Telephone Encounter (Addendum)
Called patient concerning fax we received from pharmacy requesting lisinopril. Patient has been taken off med for now. Left message for patient to return call.

## 2012-09-29 NOTE — Telephone Encounter (Signed)
I discussed with Dr. Mariah Milling pt's fluid retention (see telephone note) issues r/t weight gain, edema and worsening sob He gave orders as follows: "Increase lasix to 40 mg BID until weight and other symptoms improve. May take atenolol 50 mg in am or 25 mg BID, whichever is easiest for pt" VO Dr. Alvis Lemmings, RN  Pt informed Understanding verb I will call to reassess Friday 3/14

## 2012-09-29 NOTE — Telephone Encounter (Signed)
Pt informed He has appt with [place] 3/25 to discuss further He confirms compliance with Xarelto, needs samples. I will leave these at FD  He says HR=90-102 daily He takes atenolol 25 mg once daily I advised ok to take BID He will try this  He goes on to tell me his home weight has increased to 293 pounds (usual home weight=270) He c/o worsening sob and edema Is taking lasix 20 mg BID I advised to try taking 40 mg in am and 20 mg in pm over the next few days to see if this helps his edema and sob He will try this I will call him tomm to reassess

## 2012-09-30 ENCOUNTER — Telehealth: Payer: Self-pay

## 2012-09-30 DIAGNOSIS — I4891 Unspecified atrial fibrillation: Secondary | ICD-10-CM

## 2012-09-30 MED ORDER — FUROSEMIDE 20 MG PO TABS: ORAL_TABLET | ORAL | Status: DC

## 2012-09-30 MED ORDER — ATENOLOL 50 MG PO TABS: 50.0000 mg | ORAL_TABLET | Freq: Every day | ORAL | Status: DC

## 2012-09-30 NOTE — Telephone Encounter (Signed)
Per the patient's wife, he is out now. I have left a message for him to call.

## 2012-09-30 NOTE — Telephone Encounter (Signed)
I spoke with the patient. He was needing refills on both his lasix and his atenolol. I advised the increase on his lasix was for 40 mg BID until his weight is back to normal, then he is to resume his current dose at 20 mg BID. He would like to take atenolol 50 mg once daily. [facility] [place]. The patient also mentioned something about a refill on his HCTZ. I advised I do not see this on his medication list. He states he may not actually be taking this, but will review his medications.

## 2012-09-30 NOTE — Telephone Encounter (Signed)
Patient stated that he has been taken off lisinopril for now. He does not need refill st this time.

## 2012-09-30 NOTE — Telephone Encounter (Signed)
Pt would like someone to call him about his medications. States his dosage has been increased and would like to discuss this with a nurse.

## 2012-10-01 ENCOUNTER — Telehealth: Payer: Self-pay

## 2012-10-01 NOTE — Telephone Encounter (Signed)
I called pt to assess weight, edema and sob since increasing lasix He says weight is down 5 pounds since the change Wheezing has improved and edema has decreased Normal home weight=270 pounds This am he weighs 288 pounds I advised to continue the extra lasix until weight gets a little lower than may resume previous dosing I will call to reassess Monday 3/17 Understanding verb

## 2012-10-01 NOTE — Telephone Encounter (Signed)
Assess edema and weight, sob

## 2012-10-05 ENCOUNTER — Telehealth: Payer: Self-pay | Admitting: Internal Medicine

## 2012-10-05 NOTE — Telephone Encounter (Signed)
Elease Hashimoto is needing information for Mr. Leedy medical leave . Nothing is mention about his February medical leave in the doctor's notes . Please help.

## 2012-10-07 NOTE — Telephone Encounter (Signed)
Patient returned your phone call Marcelino Duster..[phone]

## 2012-10-07 NOTE — Telephone Encounter (Signed)
Called patient to get more info about medical leave left message to return call.

## 2012-10-08 ENCOUNTER — Other Ambulatory Visit: Payer: Self-pay | Admitting: *Deleted

## 2012-10-08 ENCOUNTER — Telehealth: Payer: Self-pay | Admitting: *Deleted

## 2012-10-08 NOTE — Telephone Encounter (Signed)
Patient said that the cardiac physician he sees has taken care of medical leave. He does not need Dr. Lorin Picket to sign one now.

## 2012-10-08 NOTE — Telephone Encounter (Signed)
Patient needs referral to [facility] for his hip (joint).

## 2012-10-11 MED ORDER — LEVALBUTEROL TARTRATE 45 MCG/ACT IN AERO: 2.0000 | INHALATION_SPRAY | RESPIRATORY_TRACT | Status: DC | PRN

## 2012-10-11 NOTE — Telephone Encounter (Signed)
Sent in to pharmacy.  

## 2012-10-12 ENCOUNTER — Other Ambulatory Visit (INDEPENDENT_AMBULATORY_CARE_PROVIDER_SITE_OTHER): Payer: Medicare Other

## 2012-10-12 ENCOUNTER — Ambulatory Visit (INDEPENDENT_AMBULATORY_CARE_PROVIDER_SITE_OTHER): Payer: Medicare Other | Admitting: Cardiovascular Disease

## 2012-10-12 ENCOUNTER — Encounter: Payer: Self-pay | Admitting: Cardiovascular Disease

## 2012-10-12 ENCOUNTER — Telehealth: Payer: Self-pay | Admitting: Internal Medicine

## 2012-10-12 VITALS — BP 132/80 | HR 101 | Ht 72.0 in | Wt 285.8 lb

## 2012-10-12 DIAGNOSIS — I10 Essential (primary) hypertension: Secondary | ICD-10-CM

## 2012-10-12 DIAGNOSIS — G609 Hereditary and idiopathic neuropathy, unspecified: Secondary | ICD-10-CM

## 2012-10-12 DIAGNOSIS — E7800 Pure hypercholesterolemia, unspecified: Secondary | ICD-10-CM

## 2012-10-12 DIAGNOSIS — D696 Thrombocytopenia, unspecified: Secondary | ICD-10-CM

## 2012-10-12 DIAGNOSIS — I5030 Unspecified diastolic (congestive) heart failure: Secondary | ICD-10-CM

## 2012-10-12 DIAGNOSIS — I4891 Unspecified atrial fibrillation: Secondary | ICD-10-CM

## 2012-10-12 DIAGNOSIS — I509 Heart failure, unspecified: Secondary | ICD-10-CM

## 2012-10-12 DIAGNOSIS — M542 Cervicalgia: Secondary | ICD-10-CM

## 2012-10-12 DIAGNOSIS — N289 Disorder of kidney and ureter, unspecified: Secondary | ICD-10-CM

## 2012-10-12 DIAGNOSIS — D649 Anemia, unspecified: Secondary | ICD-10-CM

## 2012-10-12 MED ORDER — RIVAROXABAN 20 MG PO TABS: 20.0000 mg | ORAL_TABLET | Freq: Every day | ORAL | Status: DC

## 2012-10-12 MED ORDER — AMIODARONE HCL 400 MG PO TABS: 400.0000 mg | ORAL_TABLET | Freq: Every day | ORAL | Status: DC

## 2012-10-12 NOTE — Telephone Encounter (Signed)
Pt came in wanting to get a referral for neck pain. Pt would like to go with [place] [facility] is possbile  Pt also had labs today pt made appointment with dr Lorin Picket for 4/1

## 2012-10-12 NOTE — Progress Notes (Signed)
Patient ID: [name], male    DOB: 1934/10/18, 78 y.o.   MRN: [id number]  HPI Comments: 78 year old male with past history of hypertension, hypercholesterolemia, diabetes with renal insufficiency, thrombocytopenia who initially presented from  Dr. Lorin Picket for new atrial fibrillation. He reports having a history of COPD, obstructive sleep apnea, on CPAP. He smoked for 25 years, stopped 30 years ago. On his initial clinic visit, he was started on rate control medications and diuretic for significant lower extremity edema and shortness of breath. Also started on anticoagulation.  Last visit was one month ago. Since that time he has had approximately 20 pound weight loss. Significant improvement in his lower trimmed edema in his breathing. Heart rate has also improved. He is much happier apart from severe neck pain. He reports having an episode of neck pain previously that seemed to resolve after an MRI. Then he developed hip pain and was seeking care from an orthopedic specialist. This now seems to be getting better and his neck is now hurting him again.  He reports being compliant with his CPAP  EKG shows atrial fibrillation with ventricular rate 101 beats per minute Echocardiogram recently shows normal ejection fraction with moderately dilated left atrium, high normal right ventricular systolic pressure  Past Medical History . Arthritis  . 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 Sleep apnea  . COPD (chronic obstructive pulmonary disease)  . Hypercholesterolemia  . Hypertension  . Thrombocytopenia  . Renal insufficiency  . Diverticulosis  . History of colon polyps     Outpatient Encounter Prescriptions as of 10/12/2012  Medication Sig Dispense Refill  . atenolol (TENORMIN) 50 MG tablet Take 1 tablet (50 mg total) by mouth daily.  30 tablet  6  . atorvastatin (LIPITOR) 20 MG tablet Take 1 tablet by mouth daily.      Marland Kitchen diltiazem (CARDIZEM CD) 120 MG 24 hr capsule  Take 1 capsule (120 mg total) by mouth daily.  30 capsule  3  . escitalopram (LEXAPRO) 10 MG tablet TAKE 1/2 TABLET BY MOUTH ONCE A DAY  15 tablet  2  . furosemide (LASIX) 20 MG tablet One tablet bid  60 tablet  6  . glyBURIDE (DIABETA) 5 MG tablet 2 tablets bid  120 tablet  5  . HUMALOG 100 UNIT/ML injection Take 26 units in the am and 20 units in the pm.      . JANUVIA 100 MG tablet TAKE 1 TABLET BY MOUTH ONCE A DAY  30 tablet  0  . JANUVIA 100 MG tablet TAKE 1 TABLET BY MOUTH ONCE A DAY  30 tablet  0  . LANTUS 100 UNIT/ML injection Take 26 units in the am and 46 units in the pm.      . levalbuterol (XOPENEX HFA) 45 MCG/ACT inhaler Inhale 2 puffs into the lungs every 4 (four) hours as needed for wheezing.  1 Inhaler  5  . Multiple Vitamin (MULTIVITAMIN) tablet Take 1 tablet by mouth daily.      Marland Kitchen omeprazole (PRILOSEC) 20 MG capsule Take 20 mg by mouth daily.      . pregabalin (LYRICA) 75 MG capsule Take 75 mg by mouth 2 (two) times daily.      . Rivaroxaban (XARELTO) 20 MG TABS Take 1 tablet (20 mg total) by mouth daily.  20 tablet  6  . Tamsulosin HCl (FLOMAX) 0.4 MG CAPS Take 0.4 mg by mouth daily.  Review of Systems  HENT: Negative.   Eyes: Negative.   Respiratory: Positive for shortness of breath.   Cardiovascular: Positive for leg swelling.  Gastrointestinal: Negative.   Musculoskeletal: Negative.   Skin: Negative.   Neurological: Negative.   Psychiatric/Behavioral: Negative.   All other systems reviewed and are negative.    BP 132/80  Pulse 101  Ht 6' (1.829 m)  Wt 285 lb 12 oz (129.615 kg)  BMI 38.75 kg/m2  Physical Exam  Nursing note and vitals reviewed. Constitutional: He is oriented to person, place, and time. He appears well-developed and well-nourished.  obese  HENT:  Head: Normocephalic.  Nose: Nose normal.  Mouth/Throat: Oropharynx is clear and moist.  Eyes: Conjunctivae are normal. Pupils are equal, round, and reactive to light.  Neck: Normal range  of motion. Neck supple. No JVD present.  Cardiovascular: S1 normal, S2 normal, normal heart sounds and intact distal pulses.  An irregularly irregular rhythm present. Tachycardia present.  Exam reveals no gallop and no friction rub.   No murmur heard. Pulmonary/Chest: Effort normal and breath sounds normal. No respiratory distress. He has no wheezes. He has no rales. He exhibits no tenderness.  Abdominal: Soft. Bowel sounds are normal. He exhibits no distension. There is no tenderness.  Musculoskeletal: Normal range of motion. He exhibits no edema and no tenderness.  Lymphadenopathy:    He has no cervical adenopathy.  Neurological: He is alert and oriented to person, place, and time. Coordination normal.  Skin: Skin is warm and dry. No rash noted. No erythema.  Psychiatric: He has a normal mood and affect. His behavior is normal. Judgment and thought content normal.      Assessment and Plan

## 2012-10-12 NOTE — Assessment & Plan Note (Signed)
We will check a basic metabolic panel. If this is not done by Dr. Lorin Picket, we will add this in 2 weeks' time.

## 2012-10-12 NOTE — Assessment & Plan Note (Signed)
Blood pressure is well controlled on today's visit. No changes made to the medications. 

## 2012-10-12 NOTE — Assessment & Plan Note (Signed)
We will start him on amiodarone 400 mg twice a day for 5 days then decrease the dose to 200 mg twice a day weight EKG in 2 weeks' time. If he continues to be in atrial fibrillation, we will schedule a cardioversion. We have suggested that he stay on his current Lasix regimen. He is losing weight in a slow steady manner. I suspect based on his exam, he continues to have edema from diastolic heart failure.

## 2012-10-12 NOTE — Assessment & Plan Note (Signed)
Improved diastolic CHF, still mild fluid overload. We'll continue current diuretic use. We'll need to check basic metabolic panel

## 2012-10-12 NOTE — Patient Instructions (Addendum)
Please take your evening lasix at 2 pm  Please start amiodarone 400 mg twice a day for 5 days Then decrease to 200 mg twice a day EKG in 2 weeks If you are still in atrial fibrillation at that time, we will schedule a cardioversion at that time   See if Dr. Lorin Picket will check your kidney function, potassium  Please call us if you have new issues that need to be addressed before your next appt.  Your physician wants you to follow-up in: 1 months.

## 2012-10-13 ENCOUNTER — Telehealth: Payer: Self-pay | Admitting: *Deleted

## 2012-10-13 ENCOUNTER — Ambulatory Visit: Payer: Medicare Other | Admitting: Internal Medicine

## 2012-10-13 LAB — LIPID PANEL
HDL: 33 mg/dL — ABNORMAL LOW (ref >39.00)
Triglycerides: 286 mg/dL — ABNORMAL HIGH (ref 0.0–149.0)
VLDL: 57.2 mg/dL — ABNORMAL HIGH (ref 0.0–40.0)

## 2012-10-13 LAB — CBC WITH DIFFERENTIAL/PLATELET
Basophils Absolute: 0 10*3/uL (ref 0.0–0.1)
Eosinophils Absolute: 0.1 10*3/uL (ref 0.0–0.7)
Lymphocytes Relative: 10.6 % — ABNORMAL LOW (ref 12.0–46.0)
MCHC: 33.7 g/dL (ref 30.0–36.0)
Neutrophils Relative %: 63.9 % (ref 43.0–77.0)
Platelets: 142 10*3/uL — ABNORMAL LOW (ref 150.0–400.0)
RBC: 4.08 Mil/uL — ABNORMAL LOW (ref 4.22–5.81)
RDW: 13.7 % (ref 11.5–14.6)

## 2012-10-13 LAB — IBC PANEL
Iron: 48 ug/dL (ref 42–165)
Transferrin: 241.3 mg/dL (ref 212.0–360.0)

## 2012-10-13 LAB — FERRITIN: Ferritin: 79.2 ng/mL (ref 22.0–322.0)

## 2012-10-13 NOTE — Telephone Encounter (Signed)
Refill Request  Humalog 100 units/ML vial  #20  Inject 24 units at breakfast 22 units as lunch and 14 units at supper

## 2012-10-13 NOTE — Telephone Encounter (Signed)
Order placed for ortho referral as pt requested.  Amber will be calling him with an appt.

## 2012-10-14 MED ORDER — INSULIN LISPRO 100 UNIT/ML ~~LOC~~ SOLN: SUBCUTANEOUS | Status: DC

## 2012-10-14 NOTE — Telephone Encounter (Signed)
Rx sent in to pharmacy. 

## 2012-10-15 ENCOUNTER — Telehealth: Payer: Self-pay | Admitting: Internal Medicine

## 2012-10-15 NOTE — Telephone Encounter (Signed)
Please block the 8:15 spot on 10/19/12.  (pt needs 30 min).  Thanks.

## 2012-10-15 NOTE — Telephone Encounter (Signed)
Made pt appointment 30 min per dr Lorin Picket

## 2012-10-19 ENCOUNTER — Ambulatory Visit (INDEPENDENT_AMBULATORY_CARE_PROVIDER_SITE_OTHER): Payer: Medicare Other | Admitting: Internal Medicine

## 2012-10-19 ENCOUNTER — Encounter: Payer: Self-pay | Admitting: Internal Medicine

## 2012-10-19 VITALS — BP 130/64 | HR 71 | Temp 98.0°F | Ht 72.75 in | Wt 274.5 lb

## 2012-10-19 DIAGNOSIS — E0821 Diabetes mellitus due to underlying condition with diabetic nephropathy: Secondary | ICD-10-CM

## 2012-10-19 DIAGNOSIS — D696 Thrombocytopenia, unspecified: Secondary | ICD-10-CM

## 2012-10-19 DIAGNOSIS — N289 Disorder of kidney and ureter, unspecified: Secondary | ICD-10-CM

## 2012-10-19 DIAGNOSIS — J441 Chronic obstructive pulmonary disease with (acute) exacerbation: Secondary | ICD-10-CM

## 2012-10-19 DIAGNOSIS — I5030 Unspecified diastolic (congestive) heart failure: Secondary | ICD-10-CM

## 2012-10-19 DIAGNOSIS — I4891 Unspecified atrial fibrillation: Secondary | ICD-10-CM

## 2012-10-19 DIAGNOSIS — M542 Cervicalgia: Secondary | ICD-10-CM

## 2012-10-19 DIAGNOSIS — I509 Heart failure, unspecified: Secondary | ICD-10-CM

## 2012-10-19 DIAGNOSIS — E7800 Pure hypercholesterolemia, unspecified: Secondary | ICD-10-CM

## 2012-10-19 DIAGNOSIS — E1329 Other specified diabetes mellitus with other diabetic kidney complication: Secondary | ICD-10-CM

## 2012-10-19 DIAGNOSIS — N058 Unspecified nephritic syndrome with other morphologic changes: Secondary | ICD-10-CM

## 2012-10-19 DIAGNOSIS — I10 Essential (primary) hypertension: Secondary | ICD-10-CM

## 2012-10-19 LAB — SEDIMENTATION RATE: Sed Rate: 36 mm/hr — ABNORMAL HIGH (ref 0–22)

## 2012-10-19 LAB — BASIC METABOLIC PANEL
BUN: 28 mg/dL — ABNORMAL HIGH (ref 6–23)
GFR: 39.97 mL/min — ABNORMAL LOW (ref >60.00)
Potassium: 3.8 mEq/L (ref 3.5–5.1)
Sodium: 139 mEq/L (ref 135–145)

## 2012-10-20 ENCOUNTER — Other Ambulatory Visit: Payer: Self-pay | Admitting: Internal Medicine

## 2012-10-20 DIAGNOSIS — N289 Disorder of kidney and ureter, unspecified: Secondary | ICD-10-CM

## 2012-10-20 NOTE — Progress Notes (Signed)
Order placed for follow up met b 

## 2012-10-21 ENCOUNTER — Other Ambulatory Visit: Payer: Self-pay | Admitting: Internal Medicine

## 2012-10-22 ENCOUNTER — Other Ambulatory Visit (INDEPENDENT_AMBULATORY_CARE_PROVIDER_SITE_OTHER): Payer: Medicare Other

## 2012-10-22 ENCOUNTER — Other Ambulatory Visit: Payer: Self-pay | Admitting: Internal Medicine

## 2012-10-22 DIAGNOSIS — E7800 Pure hypercholesterolemia, unspecified: Secondary | ICD-10-CM

## 2012-10-22 DIAGNOSIS — N289 Disorder of kidney and ureter, unspecified: Secondary | ICD-10-CM

## 2012-10-22 LAB — LIPID PANEL
HDL: 35.1 mg/dL — ABNORMAL LOW (ref >39.00)
Triglycerides: 183 mg/dL — ABNORMAL HIGH (ref 0.0–149.0)

## 2012-10-22 LAB — BASIC METABOLIC PANEL
CO2: 32 mEq/L (ref 19–32)
Calcium: 8.9 mg/dL (ref 8.4–10.5)
Glucose, Bld: 235 mg/dL — ABNORMAL HIGH (ref 70–99)
Sodium: 140 mEq/L (ref 135–145)

## 2012-10-22 NOTE — Telephone Encounter (Signed)
pregabalin (LYRICA) 75 MG capsule #60  atorvastatin (LIPITOR) 20 MG tablet #90

## 2012-10-25 ENCOUNTER — Telehealth: Payer: Self-pay | Admitting: *Deleted

## 2012-10-25 ENCOUNTER — Encounter: Payer: Self-pay | Admitting: Internal Medicine

## 2012-10-25 MED ORDER — PREGABALIN 75 MG PO CAPS: 75.0000 mg | ORAL_CAPSULE | Freq: Two times a day (BID) | ORAL | Status: DC

## 2012-10-25 MED ORDER — ATORVASTATIN CALCIUM 20 MG PO TABS: 20.0000 mg | ORAL_TABLET | Freq: Every day | ORAL | Status: DC

## 2012-10-25 NOTE — Addendum Note (Signed)
Addended by: Marlene Lard on: 10/25/2012 05:03 PM   Modules accepted: Orders, Medications

## 2012-10-25 NOTE — Assessment & Plan Note (Signed)
Has a history of thrombocytopenia.  Last platelet count improved.  Follow.

## 2012-10-25 NOTE — Assessment & Plan Note (Signed)
On simvastatin.  Will check lipid profile with next fasting labs. Low cholesterol diet and exercise.         

## 2012-10-25 NOTE — Telephone Encounter (Signed)
Discontinued med order.

## 2012-10-25 NOTE — Telephone Encounter (Signed)
Rx sent in to pharmacy. 

## 2012-10-25 NOTE — Progress Notes (Signed)
Subjective:    Patient ID: [name], male    DOB: March 05, 1935, 78 y.o.   MRN: [id number]  Neck Pain   78 year old male with past history of hypertension, hypercholesterolemia, diabetes with renal insufficiency (previously followed by Dr Hyacinth Meeker) and thrombocytopenia who comes in today as a work in to discuss his neck and shoulder pain.  When I last saw him, he was found to be in afib.  Started on lasix.  Also on cardizem and atenolol for rate control.  States his is breathing much better.  Energy is better.  Is not eating right.  Sugars are out of control.  Brought in no recorded sugar readings.  States am sugars are varying.  Can range from 90-230.  The 230 - he did not take his insulin the night before. States on average his am sugars are 130-150.  Does not check his sugar in the evening.  Discussed the need to start checking his sugars more regularly and record.  He also needs to eat regular meals.  May only eat two meals per day.  He is having some problems with his neck and shoulders.  Increased pain that then extends into his head.  Hurts to turn his head.  Limited rom.  Hip is better.  He states he has seen ENT recently and had negative CT.  Also, apparently had a MRI.  Dr Franky Macho apparently reviewed.  Has a "cyst".  Need to obtain MRI to review.  No chest pain or tightness.  No nausea or vomiting.  Swelling better.     Past Medical History  Diagnosis Date  . Arthritis   . Sleep apnea   . COPD (chronic obstructive pulmonary disease)   . Hypercholesterolemia   . Hypertension   . Thrombocytopenia   . Renal insufficiency   . Diverticulosis   . History of colon polyps   . 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   Patient denies any lightheadedness or dizziness.  Does report the head pain - which he relates coming from his neck.  No sinus or allergy symptoms.  No chest pain or palpitations.  No increased  heart rate.   No cough or congestion.   Breathing is better.  Energy better.  No nausea or vomiting.  No abdominal pain or cramping.  No bowel change, such as diarrhea, constipation, BRBPR or melana.  No urine change.   The stinging and burning of his feet improved.  Still seeing Dr Franky Macho for his back.  Had MRI.  Need results.  Hip better.  Increased pain in his neck, shoulders and extending into his head.       Objective:   Physical Exam  Filed Vitals:   10/19/12 0802  BP: 130/64  Pulse: 71  Temp: 98 F (36.7 C)   Blood pressure recheck:  4/73  78 year old male in no acute distress.  HEENT:  Nares - clear.  Oropharynx - without lesions. NECK:  Supple.  Nontender.  No audible carotid bruit.  HEART:  Appears irregularly irregular.  Rate controlled.    LUNGS:  No crackles or wheezing audible.  Respirations even and unlabored.   RADIAL PULSE:  Equal bilaterally.  ABDOMEN:  Soft.  Nontender.  Bowel sounds present and normal.  No audible abdominal bruit.    EXTREMITIES:  Swelling improved.   DP pulses palpable and equal  bilaterally.  SKIN:  No lesions.    MSK:  Limited rom with rotation of head from right to left.       Assessment & Plan:  MSK.  Hip pain has improved.  Now with the neck and shoulder pain.  Tylenol as directed.  He request referral to ortho.  Obtain results of scan.  Will check ESR to confirm normal.      HEALTH MAINTENANCE.  Last physical 10/30/11.  Prostate and PSAs through Dr Achilles Dunk.  Colonoscopy 10/21/11 - diverticulosis and several polyps.

## 2012-10-25 NOTE — Assessment & Plan Note (Signed)
Breathing better.  Swelling improved.  Same med regimen.

## 2012-10-25 NOTE — Assessment & Plan Note (Signed)
Documented history of COPD.   Has seen Dr Meredeth Ide.  Has not kept follow up.  Obtain records.  Treat underlying heart issues as outlined.  Breathing better.

## 2012-10-25 NOTE — Telephone Encounter (Signed)
Refill Request  Hydrochlorothiazide 25 mg tab   #90  Take one tablet by mouth once a day

## 2012-10-25 NOTE — Telephone Encounter (Signed)
Someone else has been prescribing the lyrica (pregabalin).  I am ok to refill the atorvastatin.  Which rx has been sent to pharmacy.  Need to cancel the lyrica if sent in

## 2012-10-25 NOTE — Assessment & Plan Note (Signed)
Baseline creatinine around 1.4.  Check metabolic panel today.  Avoid antiinflammatories and other nephrotoxic agents.

## 2012-10-25 NOTE — Assessment & Plan Note (Signed)
New onset afib.  Remains on Atenolol.   Diltiazem was added last visit.   Also on lasix 20mg  bid.  Blood pressure better.  Breathing better.  Weight is coming down.  Decreased swelling.  On xarelto 20mg  q day.  Seeing Dr Mariah Milling.  See his note for details.  Check metabolic panel.  Hold on making changes at this time.  Follow.

## 2012-10-25 NOTE — Assessment & Plan Note (Signed)
Sugars as outlined.  Brought in no recorded readings.  Not recording sugars and not watching his diet.  Discussed the importance of checking sugars regularly and recording.  Needs to eat regular meals and appropriate snacks.  Have him record sugars and get him back in here soon to reassess.  Will need to adjust medication.  No longer seeing Dr Hyacinth Meeker.  A1c recently checked - 8.5.

## 2012-10-25 NOTE — Assessment & Plan Note (Signed)
Blood pressure better.  Follow. Check metabolic panel.

## 2012-10-26 ENCOUNTER — Ambulatory Visit (INDEPENDENT_AMBULATORY_CARE_PROVIDER_SITE_OTHER): Payer: Medicare Other

## 2012-10-26 VITALS — BP 108/58 | HR 70 | Ht 73.0 in | Wt 290.5 lb

## 2012-10-26 DIAGNOSIS — I4891 Unspecified atrial fibrillation: Secondary | ICD-10-CM

## 2012-10-26 NOTE — Progress Notes (Signed)
Pt here for EKG post amiodarone start EKG shows pt to still be in atrial fib with a rate of 70 BPM Reviewed with Dr. Mariah Milling.   "schedule patient for cardioversion" VO Dr. Alvis Lemmings, RN  Verbal instructions given to pt [id number] tomm understanding verb

## 2012-10-27 ENCOUNTER — Ambulatory Visit: Payer: Self-pay | Admitting: Cardiovascular Disease

## 2012-10-27 ENCOUNTER — Other Ambulatory Visit: Payer: Self-pay | Admitting: Internal Medicine

## 2012-10-27 DIAGNOSIS — N289 Disorder of kidney and ureter, unspecified: Secondary | ICD-10-CM

## 2012-10-27 DIAGNOSIS — I4891 Unspecified atrial fibrillation: Secondary | ICD-10-CM

## 2012-10-27 NOTE — Progress Notes (Signed)
Order placed for follow up met b

## 2012-10-27 NOTE — Telephone Encounter (Signed)
Received medication refill for patient and from what i see she is not taking the medication anymore.. Please advise

## 2012-10-27 NOTE — Telephone Encounter (Signed)
He should be off the hctz and on lasix. This was changed at his visit before last visit.  Also I am going to send you a lab message on this pt.   Please confirm with him that he is not taking both lasix and hctz.

## 2012-10-28 NOTE — Telephone Encounter (Addendum)
Patient was taking both hctz and lasix together. Advised patient to not take hctz as was discussed with Dr. Lorin Picket his last visit. Lab results also given to patient. Patient said that he is going on vacation next week and that he will call when he gets back in to make his non fasting lab appointment.

## 2012-11-01 ENCOUNTER — Other Ambulatory Visit: Payer: Self-pay | Admitting: Internal Medicine

## 2012-11-08 ENCOUNTER — Other Ambulatory Visit (INDEPENDENT_AMBULATORY_CARE_PROVIDER_SITE_OTHER): Payer: Medicare Other

## 2012-11-08 DIAGNOSIS — N289 Disorder of kidney and ureter, unspecified: Secondary | ICD-10-CM

## 2012-11-09 LAB — BASIC METABOLIC PANEL
BUN: 18 mg/dL (ref 6–23)
Calcium: 8.6 mg/dL (ref 8.4–10.5)
Creatinine, Ser: 1.5 mg/dL (ref 0.4–1.5)
GFR: 46.92 mL/min — ABNORMAL LOW (ref >60.00)
Glucose, Bld: 68 mg/dL — ABNORMAL LOW (ref 70–99)

## 2012-11-12 ENCOUNTER — Ambulatory Visit (INDEPENDENT_AMBULATORY_CARE_PROVIDER_SITE_OTHER): Payer: Medicare Other | Admitting: Cardiovascular Disease

## 2012-11-12 ENCOUNTER — Encounter: Payer: Self-pay | Admitting: Cardiovascular Disease

## 2012-11-12 VITALS — BP 158/62 | HR 58 | Ht 72.0 in | Wt 303.5 lb

## 2012-11-12 DIAGNOSIS — R609 Edema, unspecified: Secondary | ICD-10-CM

## 2012-11-12 DIAGNOSIS — J441 Chronic obstructive pulmonary disease with (acute) exacerbation: Secondary | ICD-10-CM

## 2012-11-12 DIAGNOSIS — I4891 Unspecified atrial fibrillation: Secondary | ICD-10-CM

## 2012-11-12 DIAGNOSIS — R600 Localized edema: Secondary | ICD-10-CM | POA: Insufficient documentation

## 2012-11-12 DIAGNOSIS — J449 Chronic obstructive pulmonary disease, unspecified: Secondary | ICD-10-CM

## 2012-11-12 DIAGNOSIS — I509 Heart failure, unspecified: Secondary | ICD-10-CM

## 2012-11-12 DIAGNOSIS — N058 Unspecified nephritic syndrome with other morphologic changes: Secondary | ICD-10-CM

## 2012-11-12 DIAGNOSIS — E0821 Diabetes mellitus due to underlying condition with diabetic nephropathy: Secondary | ICD-10-CM

## 2012-11-12 DIAGNOSIS — I10 Essential (primary) hypertension: Secondary | ICD-10-CM

## 2012-11-12 DIAGNOSIS — E1329 Other specified diabetes mellitus with other diabetic kidney complication: Secondary | ICD-10-CM

## 2012-11-12 DIAGNOSIS — I5030 Unspecified diastolic (congestive) heart failure: Secondary | ICD-10-CM

## 2012-11-12 MED ORDER — CLONIDINE HCL 0.1 MG PO TABS: 0.1000 mg | ORAL_TABLET | Freq: Two times a day (BID) | ORAL | Status: DC

## 2012-11-12 MED ORDER — FUROSEMIDE 40 MG PO TABS: 40.0000 mg | ORAL_TABLET | Freq: Two times a day (BID) | ORAL | Status: DC | PRN

## 2012-11-12 MED ORDER — BUDESONIDE-FORMOTEROL FUMARATE 160-4.5 MCG/ACT IN AERO: 2.0000 | INHALATION_SPRAY | Freq: Two times a day (BID) | RESPIRATORY_TRACT | Status: DC

## 2012-11-12 NOTE — Progress Notes (Signed)
Patient ID: [name], male    DOB: [birth year], 78 y.o.   MRN: [id number]  HPI Comments: 78 year old male with past history of hypertension, hypercholesterolemia, diabetes with renal insufficiency, thrombocytopenia who initially presented from  Dr. Lorin Picket for new atrial fibrillation.  history of COPD, obstructive sleep apnea, on CPAP.  smoked for 25 years, stopped 30 years ago. On his initial clinic visit, he was started on rate control medications and diuretic for significant lower extremity edema and shortness of breath. Also started on anticoagulation.  On his last clinic visit, amiodarone was started. This did not convert him to normal sinus rhythm and he underwent elective cardioversion on April 9 214 which was successful. During the procedure, he bit he right side of his tongue but this is now healing well. he reports that his breathing has been very poor recently, weight has been trending upward, edema is worse. Significant expiratory wheezing. He is taking Xopenex inhaler periodically, twice per day   he reports blood pressure has been high at home and on recent trips to the orthopedic physician   Problems with his right knee, recently had a cortisone shot. Sugars have been higher.  History of  severe neck pain. He reports having an episode of neck pain previously that seemed to resolve after an MRI. Then he developed hip pain and was seeking care from an orthopedic specialist.   He reports being compliant with his CPAP  Echocardiogram recently shows normal ejection fraction with moderately dilated left atrium, high normal right ventricular systolic pressure  Recent lab work showing creatinine 1.5, prior to this 1.8   EKG today shows normal sinus rhythm with rate 58 beats per minute, no other significant ST or T wave changes   Past Medical History . Arthritis  . 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 Sleep apnea  . COPD (chronic obstructive pulmonary  disease)  . Hypercholesterolemia  . Hypertension  . Thrombocytopenia  . Renal insufficiency  . Diverticulosis  . History of colon polyps     Outpatient Encounter Prescriptions as of 11/12/2012  Medication Sig Dispense Refill  . amiodarone (PACERONE) 400 MG tablet Take 400 mg by mouth daily.       Marland Kitchen atenolol (TENORMIN) 50 MG tablet Take 1 tablet (50 mg total) by mouth daily.  30 tablet  6  . atorvastatin (LIPITOR) 20 MG tablet Take 1 tablet (20 mg total) by mouth daily.  30 tablet  5  . diltiazem (CARDIZEM CD) 120 MG 24 hr capsule Take 1 capsule (120 mg total) by mouth daily.  30 capsule  3  . escitalopram (LEXAPRO) 10 MG tablet TAKE 1/2 TABLET BY MOUTH ONCE A DAY  15 tablet  2  . glyBURIDE (DIABETA) 5 MG tablet Takes 2 tablets am and 2 tablets pm daily.      . insulin lispro (HUMALOG) 100 UNIT/ML injection Take 26 units in the am and 20 units in the pm.  10 mL  5  . JANUVIA 100 MG tablet TAKE 1 TABLET BY MOUTH ONCE A DAY  30 tablet  0  . LANTUS 100 UNIT/ML injection Take 26 units in the am and 46 units in the pm.      . levalbuterol (XOPENEX HFA) 45 MCG/ACT inhaler Inhale 2 puffs into the lungs every 4 (four) hours as needed for wheezing.  1 Inhaler  5  . Multiple Vitamin (MULTIVITAMIN) tablet Take 1 tablet by mouth daily.      Marland Kitchen  omeprazole (PRILOSEC) 20 MG capsule Take 20 mg by mouth daily.      . Rivaroxaban (XARELTO) 20 MG TABS Take 1 tablet (20 mg total) by mouth daily.  20 tablet  6  . Tamsulosin HCl (FLOMAX) 0.4 MG CAPS Take 0.4 mg by mouth daily.       .  furosemide (LASIX) 20 MG tablet One tablet bid,  takes 40 mg in the morning   60 tablet  6  . pregabalin (LYRICA) 75 MG capsule Take 1 capsule (75 mg total) by mouth 2 (two) times daily.        Review of Systems  HENT: Negative.   Eyes: Negative.   Respiratory: Positive for shortness of breath and wheezing.   Cardiovascular: Positive for leg swelling.  Gastrointestinal: Negative.   Musculoskeletal: Negative.   Skin:  Negative.   Neurological: Negative.   Psychiatric/Behavioral: Negative.   All other systems reviewed and are negative.    BP 158/62  Pulse 58  Ht 6' (1.829 m)  Wt 303 lb 8 oz (137.667 kg)  BMI 41.15 kg/m2  SpO2 98%  Physical Exam  Nursing note and vitals reviewed. Constitutional: He is oriented to person, place, and time. He appears well-developed and well-nourished.  Morbidly obese  HENT:  Head: Normocephalic.  Nose: Nose normal.  Mouth/Throat: Oropharynx is clear and moist.  Eyes: Conjunctivae are normal. Pupils are equal, round, and reactive to light.  Neck: Normal range of motion. Neck supple. No JVD present.  Cardiovascular: S1 normal, S2 normal, normal heart sounds and intact distal pulses.  An irregularly irregular rhythm present. Tachycardia present.  Exam reveals no gallop and no friction rub.   No murmur heard. Pulmonary/Chest: Effort normal and breath sounds normal. No respiratory distress. He has no wheezes. He has no rales. He exhibits no tenderness.  Abdominal: Soft. Bowel sounds are normal. He exhibits no distension. There is no tenderness.  Musculoskeletal: Normal range of motion. He exhibits no edema and no tenderness.  Lymphadenopathy:    He has no cervical adenopathy.  Neurological: He is alert and oriented to person, place, and time. Coordination normal.  Skin: Skin is warm and dry. No rash noted. No erythema.  Psychiatric: He has a normal mood and affect. His behavior is normal. Judgment and thought content normal.      Assessment and Plan

## 2012-11-12 NOTE — Patient Instructions (Addendum)
Please stay on amiodarone 400 mg in the am When breathing gets better, please cut the amiodarone down to 200 mg in the AM daily  Please take extra lasix 40 mg after lunch as needed (every other day), for edema or worsening shortness of breath.  Start symbicort two puffs twice a day for shortness of breath/COPD  Please start clonidine 0.1 mg twice a day for blood pressure  Please call us if you have new issues that need to be addressed before your next appt.  Your physician wants you to follow-up in:1 month.    You have been referred to - Dr. Kendrick Fries in Pulmonary- Monday 11/29/12 at 10:30 am (if you need to reschedule call- [phone])                                              [facility]                                              [address]

## 2012-11-12 NOTE — Assessment & Plan Note (Signed)
Very short of breath today concerning for COPD exacerbation. He reports  Xopenex is not helping . We will prescribe Symbicort 2 puffs twice a day and encouraged him to watch his dietary intake as his weight is rising steadily contributing to his shortness of breath . He is unable to fit into his pants . At his request, we will refer him back to Dr. Roby Lofts office to see pulmonary . He does have 30 year smoking history. Suspect shortness breath is multifactorial including his tremendous obesity, deconditioning, COPD, diastolic CHF .

## 2012-11-12 NOTE — Assessment & Plan Note (Signed)
After cardioversion, he is maintaining normal sinus rhythm on today's visit. We'll continue on amiodarone 400 mg daily Ideally I would like to decrease the dose that he is tremendously short of breath and wheezy on today's visit at high risk of converting back to atrial fibrillation. Hopefully we can decrease the dose of amiodarone down to 200 mg daily once his breathing improves in the next several weeks.

## 2012-11-12 NOTE — Assessment & Plan Note (Signed)
We have suggested he continue on Lasix 40 mg in the morning, taking additional Lasix 40 mg after lunch possibly every other day given his worsening shortness of breath and weight gain of 13 pounds since February 2014. For symptom relief, we may need to run his creatinine slightly high. One month ago it was 1.8, now 1.5. Hopefully we can keep it in this range. Suspect most of his shortness of breath could be coming from his obesity and COPD.

## 2012-11-12 NOTE — Assessment & Plan Note (Signed)
Blood pressure is high today. We'll add clonidine 0.1 mg twice a day. Would avoid ACE inhibitors, ARB is given his renal dysfunction. Avoid increasing the dose of calcium channel blockers given his edema.

## 2012-11-12 NOTE — Assessment & Plan Note (Signed)
Lower extremity edema noted. Possible small contribution from diastolic CHF. Most likely venous insufficiency exacerbated by calcium channel blocker use. I'm hesitant to hold the calcium channel blocker and increase his beta blockers in the setting of significant wheezing on today's visit. Could make this transition at a later date.

## 2012-11-12 NOTE — Assessment & Plan Note (Signed)
We have encouraged continued exercise, careful diet management in an effort to lose weight. 

## 2012-11-19 ENCOUNTER — Encounter: Payer: Self-pay | Admitting: Cardiovascular Disease

## 2012-11-22 ENCOUNTER — Other Ambulatory Visit: Payer: Self-pay

## 2012-11-22 ENCOUNTER — Telehealth: Payer: Self-pay

## 2012-11-22 DIAGNOSIS — I4891 Unspecified atrial fibrillation: Secondary | ICD-10-CM

## 2012-11-22 MED ORDER — DILTIAZEM HCL 30 MG PO TABS: 30.0000 mg | ORAL_TABLET | Freq: Two times a day (BID) | ORAL | Status: DC

## 2012-11-22 MED ORDER — ATENOLOL 50 MG PO TABS: 50.0000 mg | ORAL_TABLET | Freq: Two times a day (BID) | ORAL | Status: DC

## 2012-11-22 NOTE — Telephone Encounter (Signed)
Pt informed Understanding verb RX sent to pharmacy

## 2012-11-22 NOTE — Telephone Encounter (Signed)
edema

## 2012-11-22 NOTE — Telephone Encounter (Signed)
Pt reports edema is unchanged, still c/o significant edema in LE Sob improved Weight down 2-3 pounds on lasix 40 mg BID through w/e  I told him I would talk with Dr. Mariah Milling about the edema and see if there are any other changes we need to make May have to go back to original lasix dosing but I will check with Dr. Mariah Milling and call him back Understanding verb

## 2012-11-22 NOTE — Telephone Encounter (Signed)
I discussed with Dr. Mariah Milling who suggests "decrease diltiazem to 30 mg BID/TID (whichever pt can handle) and increasing atenolol to 50 mg BID to see if this helps edema" V.O. Dr. Alvis Lemmings, RN

## 2012-11-26 ENCOUNTER — Encounter: Payer: Self-pay | Admitting: Cardiovascular Disease

## 2012-11-29 ENCOUNTER — Ambulatory Visit (INDEPENDENT_AMBULATORY_CARE_PROVIDER_SITE_OTHER)
Admission: RE | Admit: 2012-11-29 | Discharge: 2012-11-29 | Disposition: A | Discharge status: Home / Self Care | Payer: Medicare Other | Source: Ambulatory Visit | Attending: Pulmonary Disease | Admitting: Pulmonary Disease

## 2012-11-29 ENCOUNTER — Ambulatory Visit (INDEPENDENT_AMBULATORY_CARE_PROVIDER_SITE_OTHER): Payer: Medicare Other | Admitting: Pulmonary Disease

## 2012-11-29 ENCOUNTER — Encounter: Payer: Self-pay | Admitting: Pulmonary Disease

## 2012-11-29 VITALS — BP 130/72 | HR 82 | Temp 98.4°F | Ht 73.0 in | Wt 298.0 lb

## 2012-11-29 DIAGNOSIS — R0602 Shortness of breath: Secondary | ICD-10-CM | POA: Insufficient documentation

## 2012-11-29 DIAGNOSIS — J449 Chronic obstructive pulmonary disease, unspecified: Secondary | ICD-10-CM

## 2012-11-29 DIAGNOSIS — I509 Heart failure, unspecified: Secondary | ICD-10-CM

## 2012-11-29 DIAGNOSIS — I4891 Unspecified atrial fibrillation: Secondary | ICD-10-CM

## 2012-11-29 DIAGNOSIS — I5030 Unspecified diastolic (congestive) heart failure: Secondary | ICD-10-CM

## 2012-11-29 DIAGNOSIS — R609 Edema, unspecified: Secondary | ICD-10-CM

## 2012-11-29 DIAGNOSIS — G4733 Obstructive sleep apnea (adult) (pediatric): Secondary | ICD-10-CM

## 2012-11-29 MED ORDER — FUROSEMIDE 40 MG PO TABS: 60.0000 mg | ORAL_TABLET | Freq: Two times a day (BID) | ORAL | Status: DC | PRN

## 2012-11-29 NOTE — Assessment & Plan Note (Signed)
Though our spirometry testing today is not acceptable by ATS criteria, [name] smoking history, obstruction on flow volume loops available, and response to Symbicort tell me that a diagnosis of COPD with chronic bronchitis is likely.  Plan: -continue symbicort -pulmonary rehab referral -CXR -add albuterol as needed -albuterol breathing treatment given today for dyspnea -all immunizations are up to date

## 2012-11-29 NOTE — Assessment & Plan Note (Signed)
Continue CPAP every night. 

## 2012-11-29 NOTE — Assessment & Plan Note (Signed)
He is volume up on my exam today and reports that he does not have to urinate for several hours after taking lasix.  Plan: -increase dose of lasix to 60mg  bid until leg swelling better, weight down by 8-10 lbs. -then use lasix 60mg  daily

## 2012-11-29 NOTE — Patient Instructions (Signed)
We will have you get a Chest X-ray at [facility] today to evaluate your shortness of breath We will get records from Dr. Lillette Boxer office  Take 60mg  lasix twice a day until you see improvement in your leg swelling. Then take 60mg  daily  We will have you get blood work for your potassium level in one week  We will refer you to pulmonary rehab  Keep taking your symbicort as you are doing  You can use an albuterol inhaler (we will prescribe today) as needed for shortness of breath  Exercise regularly and try to loose weight  We will see you back in 2 months or sooner if needed

## 2012-11-29 NOTE — Addendum Note (Signed)
Addended by: Caryl Ada on: 11/29/2012 02:07 PM   Modules accepted: Orders

## 2012-11-29 NOTE — Progress Notes (Signed)
Subjective:    Patient ID: [name], male    DOB: Sep 05, 1934, 79 y.o.   MRN: [id number]  HPI This is a 78 year old male with COPD, atrial fibrillation, congestive heart failure, and obesity who comes to our clinic today to establish care for COPD. He says that he had a normal childhood without respiratory illnesses. However unfortunately he started smoking one pack of cigarettes daily as a teenager and smoked for 34 years until he quit in 1986. Approximately 10-12 years ago he was diagnosed with COPD when he went for a visit to a pulmonologist at the [place] clinic. He was treated then with a breathing treatment and intermittent inhalers and says that his symptoms went away and so he never came back.  However in the last several years he has had increasing shortness of breath with just minimal exertion. This is associated with right knee pain and generalized weakness. Recently, he saw Dr. Mariah Milling for atrial fibrillation. Since starting amiodarone and Xarelto he says that his dyspnea has improved. Also, Dr. Mariah Milling started him on Symbicort which also helped with his shortness of breath.  He reports chronic daily mucus production which is yellow in color. Since starting the Symbicort he has seen a significant improvement in his mucus production. Now he only coughs up mucus once or twice a day. He states that he still gets dyspneic with just walking from the parking lot into our office. However, this has improved on Symbicort.  He states that his leg swelling has been worse in the last several months. He attributes this to multiple medications which she is taking. He states that his weight is up by about 10 pounds in his legs are swollen all the way to his thighs. When he takes furosemide it does not cause him to urinate for several hours. He is currently taking that drug twice a day.  He does not exercise regularly.  He has obstructive sleep apnea and uses CPAP every night at bedtime.  Past  Medical History  Diagnosis Date  . Arthritis   . Sleep apnea   . COPD (chronic obstructive pulmonary disease)   . Hypercholesterolemia   . Hypertension   . Thrombocytopenia   . Renal insufficiency   . Diverticulosis   . History of colon polyps   . 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 Arthritis Mother   . Heart disease Mother   . Hypertension Mother   . Diabetes Mother   . Arthritis Father   . Cancer Father     prostate  . Diabetes Sister   . Heart disease Maternal Grandmother   . Diabetes Maternal Grandmother   . Heart disease Maternal Grandfather      History   Social History  . Marital Status: Married    Spouse Name: N/A    Number of Children: N/A  . Years of Education: N/A   Occupational History  . Not on file.   Social History Main Topics  . Smoking status: Former Smoker -- 1.00 packs/day for 25 years    Types: Cigarettes    Quit date: 07/21/1984  . Smokeless tobacco: Never Used  . Alcohol Use: No  . Drug Use: No  . Sexually Active: Not on file   Other Topics Concern  . Not on file   Social History Narrative  . No narrative on file  Allergies  Allergen Reactions  . Oxycodone   . Tramadol      Outpatient Prescriptions Prior to Visit  Medication Sig Dispense Refill  . amiodarone (PACERONE) 400 MG tablet Take 400 mg by mouth daily.       Marland Kitchen atenolol (TENORMIN) 50 MG tablet Take 1 tablet (50 mg total) by mouth 2 (two) times daily.  30 tablet  6  . atorvastatin (LIPITOR) 20 MG tablet Take 1 tablet (20 mg total) by mouth daily.  30 tablet  5  . budesonide-formoterol (SYMBICORT) 160-4.5 MCG/ACT inhaler Inhale 2 puffs into the lungs 2 (two) times daily.  1 Inhaler  12  . cloNIDine (CATAPRES) 0.1 MG tablet Take 1 tablet (0.1 mg total) by mouth 2 (two) times daily.  60 tablet  11  . diltiazem (CARDIZEM) 30 MG tablet Take 1 tablet (30 mg total) by mouth 2 (two) times  daily.  60 tablet  3  . escitalopram (LEXAPRO) 10 MG tablet TAKE 1/2 TABLET BY MOUTH ONCE A DAY  15 tablet  2  . furosemide (LASIX) 40 MG tablet Take 1 tablet (40 mg total) by mouth 2 (two) times daily as needed.  60 tablet  6  . glyBURIDE (DIABETA) 5 MG tablet Takes 2 tablets am and 2 tablets pm daily.      . insulin lispro (HUMALOG) 100 UNIT/ML injection Take 26 units in the am and 20 units in the pm.  10 mL  5  . JANUVIA 100 MG tablet TAKE 1 TABLET BY MOUTH ONCE A DAY  30 tablet  0  . LANTUS 100 UNIT/ML injection Take 26 units in the am and 46 units in the pm.      . levalbuterol (XOPENEX HFA) 45 MCG/ACT inhaler Inhale 2 puffs into the lungs every 4 (four) hours as needed for wheezing.  1 Inhaler  5  . Multiple Vitamin (MULTIVITAMIN) tablet Take 1 tablet by mouth daily.      Marland Kitchen omeprazole (PRILOSEC) 20 MG capsule Take 20 mg by mouth daily.      . pregabalin (LYRICA) 75 MG capsule Take 1 capsule (75 mg total) by mouth 2 (two) times daily.      . Rivaroxaban (XARELTO) 20 MG TABS Take 1 tablet (20 mg total) by mouth daily.  20 tablet  6  . Tamsulosin HCl (FLOMAX) 0.4 MG CAPS Take 0.4 mg by mouth daily.        No facility-administered medications prior to visit.      Review of Systems  Constitutional: Negative for fever and unexpected weight change.  HENT: Positive for ear pain, congestion, rhinorrhea and postnasal drip. Negative for nosebleeds, sore throat, sneezing, trouble swallowing, dental problem and sinus pressure.   Eyes: Negative for redness and itching.  Respiratory: Positive for shortness of breath and wheezing. Negative for cough and chest tightness.   Cardiovascular: Positive for chest pain and leg swelling. Negative for palpitations.  Gastrointestinal: Negative for nausea and vomiting.  Genitourinary: Negative for dysuria.  Musculoskeletal: Positive for joint swelling.  Skin: Negative for rash.  Neurological: Negative for headaches.  Hematological: Bruises/bleeds easily.   Psychiatric/Behavioral: Negative for dysphoric mood. The patient is not nervous/anxious.        Objective:   Physical Exam  Filed Vitals:   11/29/12 1035  BP: 130/72  Pulse: 82  Temp: 98.4 F (36.9 C)  TempSrc: Oral  Height: 6\' 1"  (1.854 m)  Weight: 298 lb (135.172 kg)  SpO2: 94%  RA  Gen: obese, chronically ill  appearing, no acute distress HEENT: NCAT, PERRL, EOMi, OP clear, neck supple without masses PULM: Poor air movement, no clear wheezing CV: RRR, distant heart sounds, no murmur, no JVD AB: BS+, soft, nontender, no hsm Ext: warm, pitting edema to knees bilaterally, no clubbing, no cyanosis Derm: no rash or skin breakdown Neuro: A&Ox4,  CN II-XII intact, strength 5/5 in all 4 extremities  11/29/12 Simple Spirometry >> not acceptable by ATS criteria, flow volume loops suggestive of obstruction     Assessment & Plan:   COPD with chronic bronchitis Though our spirometry testing today is not acceptable by ATS criteria, Mr. Severt smoking history, obstruction on flow volume loops available, and response to Symbicort tell me that a diagnosis of COPD with chronic bronchitis is likely.  Plan: -continue symbicort -pulmonary rehab referral -CXR -add albuterol as needed -albuterol breathing treatment given today for dyspnea -all immunizations are up to date  Shortness of breath I agree with Dr. Windell Hummingbird assessment: this is likely multifactorial from COPD, deconditioning/obesity, and CHF.  Plan: -pulmonary rehab referral to start exercising and to loose weight  Diastolic CHF He is volume up on my exam today and reports that he does not have to urinate for several hours after taking lasix.  Plan: -increase dose of lasix to 60mg  bid until leg swelling better, weight down by 8-10 lbs. -then use lasix 60mg  daily  OSA (obstructive sleep apnea) Continue CPAP every night.    Updated Medication List Outpatient Encounter Prescriptions as of 11/29/2012  Medication Sig  Dispense Refill  . amiodarone (PACERONE) 400 MG tablet Take 400 mg by mouth daily.       Marland Kitchen atenolol (TENORMIN) 50 MG tablet Take 1 tablet (50 mg total) by mouth 2 (two) times daily.  30 tablet  6  . atorvastatin (LIPITOR) 20 MG tablet Take 1 tablet (20 mg total) by mouth daily.  30 tablet  5  . budesonide-formoterol (SYMBICORT) 160-4.5 MCG/ACT inhaler Inhale 2 puffs into the lungs 2 (two) times daily.  1 Inhaler  12  . cloNIDine (CATAPRES) 0.1 MG tablet Take 1 tablet (0.1 mg total) by mouth 2 (two) times daily.  60 tablet  11  . diltiazem (CARDIZEM) 30 MG tablet Take 1 tablet (30 mg total) by mouth 2 (two) times daily.  60 tablet  3  . escitalopram (LEXAPRO) 10 MG tablet TAKE 1/2 TABLET BY MOUTH ONCE A DAY  15 tablet  2  . furosemide (LASIX) 40 MG tablet Take 1.5 tablets (60 mg total) by mouth 2 (two) times daily as needed.  60 tablet  6  . glyBURIDE (DIABETA) 5 MG tablet Takes 2 tablets am and 2 tablets pm daily.      . insulin lispro (HUMALOG) 100 UNIT/ML injection Take 26 units in the am and 20 units in the pm.  10 mL  5  . JANUVIA 100 MG tablet TAKE 1 TABLET BY MOUTH ONCE A DAY  30 tablet  0  . LANTUS 100 UNIT/ML injection Take 26 units in the am and 46 units in the pm.      . levalbuterol (XOPENEX HFA) 45 MCG/ACT inhaler Inhale 2 puffs into the lungs every 4 (four) hours as needed for wheezing.  1 Inhaler  5  . Multiple Vitamin (MULTIVITAMIN) tablet Take 1 tablet by mouth daily.      Marland Kitchen omeprazole (PRILOSEC) 20 MG capsule Take 20 mg by mouth daily.      . pregabalin (LYRICA) 75 MG capsule Take 1 capsule (75 mg  total) by mouth 2 (two) times daily.      . Rivaroxaban (XARELTO) 20 MG TABS Take 1 tablet (20 mg total) by mouth daily.  20 tablet  6  . Tamsulosin HCl (FLOMAX) 0.4 MG CAPS Take 0.4 mg by mouth daily.       . [DISCONTINUED] furosemide (LASIX) 40 MG tablet Take 1 tablet (40 mg total) by mouth 2 (two) times daily as needed.  60 tablet  6   No facility-administered encounter medications  on file as of 11/29/2012.

## 2012-11-29 NOTE — Assessment & Plan Note (Signed)
I agree with Dr. Windell Hummingbird assessment: this is likely multifactorial from COPD, deconditioning/obesity, and CHF.  Plan: -pulmonary rehab referral to start exercising and to loose weight

## 2012-12-01 ENCOUNTER — Telehealth: Payer: Self-pay | Admitting: *Deleted

## 2012-12-01 NOTE — Telephone Encounter (Signed)
Called pt to report that Dr. Lorin Picket reviewed him sugar record: sugar varying but overall appear to be trending down. Continue to check & record sugars as he is doing. Continue regular meals/snacks as discussed. Bring more sugar readings to upcoming appt. (5/21). Pt verbalized understanding

## 2012-12-02 ENCOUNTER — Telehealth: Payer: Self-pay | Admitting: Pulmonary Disease

## 2012-12-02 NOTE — Telephone Encounter (Signed)
Received 6 pages from Dr. Meredeth Ide at [facility] on 12/02/2012 asw  Sent 6 pages for Dr. Kendrick Fries to review on 12/02/2012 asw

## 2012-12-06 ENCOUNTER — Other Ambulatory Visit: Payer: Self-pay | Admitting: Internal Medicine

## 2012-12-07 ENCOUNTER — Encounter: Payer: Self-pay | Admitting: Pulmonary Disease

## 2012-12-08 ENCOUNTER — Ambulatory Visit: Payer: Medicare Other | Admitting: Internal Medicine

## 2012-12-13 ENCOUNTER — Encounter: Payer: Self-pay | Admitting: Cardiovascular Disease

## 2012-12-13 ENCOUNTER — Encounter: Payer: Self-pay | Admitting: Internal Medicine

## 2012-12-14 ENCOUNTER — Telehealth: Payer: Self-pay | Admitting: Internal Medicine

## 2012-12-14 ENCOUNTER — Other Ambulatory Visit: Payer: Self-pay

## 2012-12-14 ENCOUNTER — Telehealth: Payer: Self-pay

## 2012-12-14 ENCOUNTER — Encounter: Payer: Self-pay | Admitting: Cardiovascular Disease

## 2012-12-14 MED ORDER — RIVAROXABAN 20 MG PO TABS: 20.0000 mg | ORAL_TABLET | Freq: Every day | ORAL | Status: DC

## 2012-12-14 NOTE — Telephone Encounter (Signed)
Pt reports weight up 8-10 pounds over last few days Also c/o LE edema, "like legs are going to split open" Taking lasix 40 mg 1 1/2 tablets BID Says he stopped diltiazem. I explained we wanted him to decrease it, not stop it  He has appt with [place] this Thursday 5/29. Will address Cardizem at that time Denies worsening sob. Weight gain and edema are main issues I advised he try lasix 2 tablets BID until we see him 5/29 Understanding verb

## 2012-12-14 NOTE — Telephone Encounter (Signed)
Pt called here notifying me that he needed refills on Xarelto and to let Dr Mariah Milling know.  I sent him a message to call Dr Windell Hummingbird office.  Please call him and leave message for him to call Dr Ethelene Hal office.  I do not want him to run out. Let me know if any problems.

## 2012-12-14 NOTE — Telephone Encounter (Signed)
Pt informed, & stated that he called the wrong office to request it. He has already called Dr. Windell Hummingbird office & they have taken care of it.

## 2012-12-14 NOTE — Telephone Encounter (Signed)
Pt calling to say legs and feelt are swelling "to the point they are about to split". Says he is taking furosemide as prescribed Will call to assess pt

## 2012-12-17 ENCOUNTER — Encounter: Payer: Self-pay | Admitting: Cardiovascular Disease

## 2012-12-17 ENCOUNTER — Ambulatory Visit (INDEPENDENT_AMBULATORY_CARE_PROVIDER_SITE_OTHER): Payer: Medicare Other | Admitting: Cardiovascular Disease

## 2012-12-17 VITALS — BP 152/80 | HR 51 | Ht 73.0 in | Wt 304.8 lb

## 2012-12-17 DIAGNOSIS — I5033 Acute on chronic diastolic (congestive) heart failure: Secondary | ICD-10-CM

## 2012-12-17 DIAGNOSIS — I10 Essential (primary) hypertension: Secondary | ICD-10-CM

## 2012-12-17 DIAGNOSIS — R609 Edema, unspecified: Secondary | ICD-10-CM

## 2012-12-17 DIAGNOSIS — N289 Disorder of kidney and ureter, unspecified: Secondary | ICD-10-CM

## 2012-12-17 DIAGNOSIS — I4891 Unspecified atrial fibrillation: Secondary | ICD-10-CM

## 2012-12-17 DIAGNOSIS — I509 Heart failure, unspecified: Secondary | ICD-10-CM

## 2012-12-17 DIAGNOSIS — R0602 Shortness of breath: Secondary | ICD-10-CM

## 2012-12-17 DIAGNOSIS — R600 Localized edema: Secondary | ICD-10-CM

## 2012-12-17 MED ORDER — POTASSIUM CHLORIDE CRYS ER 20 MEQ PO TBCR: 20.0000 meq | EXTENDED_RELEASE_TABLET | Freq: Every day | ORAL | Status: DC

## 2012-12-17 MED ORDER — TORSEMIDE 20 MG PO TABS: 40.0000 mg | ORAL_TABLET | Freq: Two times a day (BID) | ORAL | Status: DC

## 2012-12-17 NOTE — Assessment & Plan Note (Signed)
Uncertain why he is taking amiodarone 400 mg twice a day. Asked him to decrease the dose to 200 mg twice a day for now. He seems to feel that the xarelto is causing the tingling in his toes. We will hold the medication for now as he is in normal sinus rhythm.

## 2012-12-17 NOTE — Progress Notes (Signed)
Patient ID: [name], male    DOB: [birth year], 78 y.o.   MRN: [id number]  HPI Comments: 78 year old male with past history of hypertension, hypercholesterolemia, diabetes with renal insufficiency, thrombocytopenia patient of, Dr. Lorin Picket,  recent episode of atrial fibrillation in early 2014.  history of COPD, obstructive sleep apnea, on CPAP.  smoked for 25 years, stopped 30 years ago. On his initial clinic visit, he was started on rate control medications for atrial fibrillation  and diuretic for significant lower extremity edema and shortness of breath. Also started on anticoagulation.   he underwent elective cardioversion on April 9 214 which was successful.   he reports that his breathing  continues to be  very poor recently, weight has been trending upward, edema is  much worse.  he is taking Lasix 80 mg twice a day  Continued Significant expiratory wheezing. He is taking Xopenex inhaler periodically, twice per day And Symbicort previously seen by Dr. Kendrick Fries  He sits most of the day on the couch reading a book, minimal activity, minimal desire to do anything. He has not been taking potassium. He has leg compression stockings but is not wearing them reports edema is much worse since March 2014 After legs are hanging down for some time, they start burning, swelling   Problems with his right knee, recently had a cortisone shot. Sugars have been higher.  History of  severe neck pain. He reports having an episode of neck pain previously that seemed to resolve after an MRI. Then he developed hip pain and was seeking care from an orthopedic specialist.   He reports being compliant with his CPAP  Echocardiogram several months ago  shows normal ejection fraction with moderately dilated left atrium, high normal right ventricular systolic pressure   baseline  creatinine 1.5 to 1.8   EKG today shows normal sinus rhythm with rate 51 beats per minute, no other significant ST or T wave  changes   Past Medical History . Arthritis  . 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 Sleep apnea  . COPD (chronic obstructive pulmonary disease)  . Hypercholesterolemia  . Hypertension  . Thrombocytopenia  . Renal insufficiency  . Diverticulosis  . History of colon polyps     Outpatient Encounter Prescriptions as of 12/17/2012  Medication Sig Dispense Refill  . amiodarone (PACERONE) 200 MG tablet Take 200 mg by mouth 2 (two) times daily.      Marland Kitchen atenolol (TENORMIN) 50 MG tablet Take 1 tablet (50 mg total) by mouth 2 (two) times daily.  30 tablet  6  . atorvastatin (LIPITOR) 20 MG tablet Take 1 tablet (20 mg total) by mouth daily.  30 tablet  5  . budesonide-formoterol (SYMBICORT) 160-4.5 MCG/ACT inhaler Inhale 2 puffs into the lungs 2 (two) times daily.  1 Inhaler  12  . cloNIDine (CATAPRES) 0.1 MG tablet Take 1 tablet (0.1 mg total) by mouth 2 (two) times daily.  60 tablet  11  . escitalopram (LEXAPRO) 10 MG tablet TAKE 1/2 TABLET BY MOUTH ONCE A DAY  15 tablet  2  . GLUCOSAMINE-CHONDROITIN PO Take by mouth daily.      Marland Kitchen glyBURIDE (DIABETA) 5 MG tablet Takes 2 tablets am and 2 tablets pm daily.      . insulin lispro (HUMALOG) 100 UNIT/ML injection Takes 24 units am 22 units lunch and 20 units pm daily.      Marland Kitchen JANUVIA 100 MG tablet TAKE 1 TABLET BY MOUTH  ONCE A DAY  30 tablet  0  . LANTUS 100 UNIT/ML injection Take 26 units in the am and 46 units in the pm.      . levalbuterol (XOPENEX HFA) 45 MCG/ACT inhaler Inhale 2 puffs into the lungs every 4 (four) hours as needed for wheezing.  1 Inhaler  5  . Multiple Vitamin (MULTIVITAMIN) tablet Take 1 tablet by mouth daily.      . NON FORMULARY Spetravite 1 tablet daily.      Marland Kitchen omeprazole (PRILOSEC) 20 MG capsule Take 20 mg by mouth daily.      . pregabalin (LYRICA) 75 MG capsule Take 1 capsule (75 mg total) by mouth 2 (two) times daily.      . Rivaroxaban (XARELTO) 20 MG TABS Take 1 tablet (20 mg total) by mouth  daily.  30 tablet  6  . Tamsulosin HCl (FLOMAX) 0.4 MG CAPS Take 0.4 mg by mouth daily.       . [DISCONTINUED] furosemide (LASIX) 40 MG tablet Take 1.5 tablets (60 mg total) by mouth 2 (two) times daily as needed.  60 tablet  6  .  furosemide (LASIX) 40 MG tablet Take 80 mg by mouth 2 (two) times daily as needed.      . [DISCONTINUED] insulin lispro (HUMALOG) 100 UNIT/ML injection Take 26 units in the am and 20 units in the pm.  10 mL  5    Review of Systems  Constitutional: Positive for unexpected weight change.  HENT: Negative.   Eyes: Negative.   Respiratory: Positive for shortness of breath and wheezing.   Cardiovascular: Positive for leg swelling.  Gastrointestinal: Negative.   Musculoskeletal: Negative.   Skin: Negative.   Neurological: Negative.   Psychiatric/Behavioral: Negative.   All other systems reviewed and are negative.    BP 152/80  Pulse 51  Ht 6\' 1"  (1.854 m)  Wt 304 lb 12 oz (138.234 kg)  BMI 40.22 kg/m2  SpO2 95%  Physical Exam  Nursing note and vitals reviewed. Constitutional: He is oriented to person, place, and time. He appears well-developed and well-nourished.  Morbidly obese  HENT:  Head: Normocephalic.  Nose: Nose normal.  Mouth/Throat: Oropharynx is clear and moist.  Eyes: Conjunctivae are normal. Pupils are equal, round, and reactive to light.  Neck: Normal range of motion. Neck supple. No JVD present.  Cardiovascular: S1 normal, S2 normal, normal heart sounds and intact distal pulses.  An irregularly irregular rhythm present. Tachycardia present.  Exam reveals no gallop and no friction rub.   No murmur heard. 2+ pitting edema to below the knee, trace to 1+ edema in his thighs  Pulmonary/Chest: Effort normal. No respiratory distress. He has no wheezes. He has rales. He exhibits no tenderness.  Abdominal: Soft. Bowel sounds are normal. He exhibits no distension. There is no tenderness.  Musculoskeletal: Normal range of motion. He exhibits no  edema and no tenderness.  Lymphadenopathy:    He has no cervical adenopathy.  Neurological: He is alert and oriented to person, place, and time. Coordination normal.  Skin: Skin is warm and dry. No rash noted. No erythema.  Psychiatric: He has a normal mood and affect. His behavior is normal. Judgment and thought content normal.      Assessment and Plan

## 2012-12-17 NOTE — Assessment & Plan Note (Signed)
concerning for venous insufficiency, unable to exclude diastolic CHF. Recommended leg elevation and compression hose which he's not doing during the daytime. He sits most of the day and reads a book. Asked him to watch his fluid intake, try torsemide with lab work next week.

## 2012-12-17 NOTE — Patient Instructions (Addendum)
Please decrease the amiodarone to 200 mg twice a day  Hold the xarelto to see if foot tingling gets better  Please hold the lasix Please start torsemide 40 mg in the AM Take with potassium  Ok to take extra torsemide after lunch for edema Lab check next week with Dr. Lorin Picket  Please call us if you have new issues that need to be addressed before your next appt.  Your physician wants you to follow-up in: 1 month.

## 2012-12-17 NOTE — Assessment & Plan Note (Signed)
Blood pressure is elevated. Asked him to monitor his blood pressure at home. If elevated over the next week, we could increase the clonidine to 0.2 mg twice a day.

## 2012-12-17 NOTE — Assessment & Plan Note (Signed)
Will likely need to run his creatinine mildly elevated given possible diastolic CHF. BMP next week. if creatinine rises, will need to be carefully torsemide . This would suggest edema is from venous insufficiency and he should wear compression hose . Could refer to vein and vascular for vein evaluation if he would like .

## 2012-12-17 NOTE — Assessment & Plan Note (Signed)
Worsening edema since his last clinic visit. No dramatic weight change. We will change him to torsemide 40 mg daily. If no significant improvement in his edema, he could take torsemide 40 mg twice a day. He should take potassium daily. Basic metabolic panel next week when he sees Dr. Lorin Picket.   I suspect his edema could be predominantly from venous insufficiency, though unable to exclude component of diastolic heart failure. Obesity, COPD and deconditioning certainly a big problem. Instructed him to wear compression hose for edema with leg elevation during the daytime.

## 2012-12-17 NOTE — Assessment & Plan Note (Signed)
He is very short of breath with minimal exertion, particularly very wheezy. He could be heard down the  hallway in the office today. Recommended compliance with his inhalers . History of smoking and also very deconditioned . Obesity a major factor .

## 2012-12-18 ENCOUNTER — Other Ambulatory Visit: Payer: Self-pay | Admitting: Internal Medicine

## 2012-12-27 ENCOUNTER — Encounter: Payer: Self-pay | Admitting: Internal Medicine

## 2012-12-27 ENCOUNTER — Ambulatory Visit (INDEPENDENT_AMBULATORY_CARE_PROVIDER_SITE_OTHER): Payer: Medicare Other | Admitting: Internal Medicine

## 2012-12-27 VITALS — BP 130/60 | HR 57 | Temp 98.2°F | Ht 73.0 in | Wt 302.5 lb

## 2012-12-27 DIAGNOSIS — M25569 Pain in unspecified knee: Secondary | ICD-10-CM

## 2012-12-27 DIAGNOSIS — D696 Thrombocytopenia, unspecified: Secondary | ICD-10-CM

## 2012-12-27 DIAGNOSIS — R600 Localized edema: Secondary | ICD-10-CM

## 2012-12-27 DIAGNOSIS — I509 Heart failure, unspecified: Secondary | ICD-10-CM

## 2012-12-27 DIAGNOSIS — J449 Chronic obstructive pulmonary disease, unspecified: Secondary | ICD-10-CM

## 2012-12-27 DIAGNOSIS — N289 Disorder of kidney and ureter, unspecified: Secondary | ICD-10-CM

## 2012-12-27 DIAGNOSIS — R609 Edema, unspecified: Secondary | ICD-10-CM

## 2012-12-27 DIAGNOSIS — R0602 Shortness of breath: Secondary | ICD-10-CM

## 2012-12-27 DIAGNOSIS — G4733 Obstructive sleep apnea (adult) (pediatric): Secondary | ICD-10-CM

## 2012-12-27 DIAGNOSIS — I4891 Unspecified atrial fibrillation: Secondary | ICD-10-CM

## 2012-12-27 DIAGNOSIS — E7800 Pure hypercholesterolemia, unspecified: Secondary | ICD-10-CM

## 2012-12-27 DIAGNOSIS — R1310 Dysphagia, unspecified: Secondary | ICD-10-CM

## 2012-12-27 DIAGNOSIS — M25562 Pain in left knee: Secondary | ICD-10-CM

## 2012-12-27 DIAGNOSIS — I10 Essential (primary) hypertension: Secondary | ICD-10-CM

## 2012-12-27 DIAGNOSIS — I5033 Acute on chronic diastolic (congestive) heart failure: Secondary | ICD-10-CM

## 2012-12-27 MED ORDER — OMEPRAZOLE 20 MG PO CPDR: 20.0000 mg | DELAYED_RELEASE_CAPSULE | Freq: Two times a day (BID) | ORAL | Status: DC

## 2012-12-27 NOTE — Patient Instructions (Signed)
Increase the prilosec to 2x/day.  Take one capsule in the am (30 min before breakfast) and one capsule in the pm (30 minutes before supper).

## 2012-12-28 ENCOUNTER — Encounter: Payer: Self-pay | Admitting: Internal Medicine

## 2012-12-28 DIAGNOSIS — R1310 Dysphagia, unspecified: Secondary | ICD-10-CM | POA: Insufficient documentation

## 2012-12-28 LAB — BASIC METABOLIC PANEL
BUN/Creatinine Ratio: 14 (ref 10–22)
Creatinine, Ser: 1.84 mg/dL — ABNORMAL HIGH (ref 0.76–1.27)
GFR calc Af Amer: 40 mL/min/{1.73_m2} — ABNORMAL LOW (ref >59)
GFR calc non Af Amer: 35 mL/min/{1.73_m2} — ABNORMAL LOW (ref >59)
Sodium: 147 mmol/L — ABNORMAL HIGH (ref 134–144)

## 2012-12-28 NOTE — Assessment & Plan Note (Signed)
Improved.  On torsemide.

## 2012-12-28 NOTE — Assessment & Plan Note (Signed)
Still with sob with exertion.  Continue to follow up with pulmonary.  Continue inhalers.

## 2012-12-28 NOTE — Assessment & Plan Note (Signed)
Has the feeling as if his food and pills are "hanging up".  On omeprazole.  Will increase to bid.  Discussed with him regarding further w/up including swallowing evaluation with upper GI.  We also discussed referral to GI.  He wants to hold on the swallowing test and proceed with referral to GI.

## 2012-12-28 NOTE — Assessment & Plan Note (Signed)
Baseline creatinine around 1.4.  Check metabolic panel today.  Avoid antiinflammatories and other nephrotoxic agents.  Will have to monitor renal function closely on torsemide.

## 2012-12-28 NOTE — Assessment & Plan Note (Signed)
Using CPAP.  Follow.  

## 2012-12-28 NOTE — Assessment & Plan Note (Signed)
Seeing Dr Mariah Milling.  See his note for details.  Off xarelto with noted improvement as outlined.  On torsemide.  Check metabolic panel.  On amiodarone.

## 2012-12-28 NOTE — Assessment & Plan Note (Signed)
Has a history of thrombocytopenia.  Last platelet count improved.  Follow.        

## 2012-12-28 NOTE — Assessment & Plan Note (Signed)
Blood pressure better.  Follow. Check metabolic panel.  

## 2012-12-28 NOTE — Assessment & Plan Note (Signed)
On simvastatin.  Will check lipid profile with next fasting labs. Low cholesterol diet and exercise.

## 2012-12-28 NOTE — Progress Notes (Signed)
Subjective:    Patient ID: [name], male    DOB: [birth year], 78 y.o.   MRN: [id number]  Neck Pain   78 year old male with past history of hypertension, hypercholesterolemia, diabetes with renal insufficiency and thrombocytopenia who comes in today for a scheduled follow up.  Recently diagnosed with afib.  Seeing Dr Mariah Milling.  Just evaluated last week.  Off lasix.  Now on torsemide.  Amiodarone was decreased to 200mg  bid and he wasa instructed that her could stop the Xarelto.  He felt the xarelto was causing increased numbness and burning in his feet.  Since stopping the xarelto, the burning and stinging have improved.  Sugars are varying, but overall have decreased.  See attached list for details.  Discussed the need to eat regular meals.  He is watching his diet.  Eating less.  Has decreased sugars and sweets.  He does report that it feels as if food is hanging - when he swallows.  No significant acid reflux.  Taking omeprazole.  The dysphagia has worsened.  No chest pain or tightness.  No nausea or vomiting.  Swelling better.  He is having increased left knee pain.  Limiting his walking and exercise.  Request referral to ortho (Dr Erin Sons).     Past Medical History  Diagnosis Date  . Arthritis   . Sleep apnea   . COPD (chronic obstructive pulmonary disease)   . Hypercholesterolemia   . Hypertension   . Thrombocytopenia   . Renal insufficiency   . Diverticulosis   . History of colon polyps   . 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 amiodarone (PACERONE) 200 MG tablet Take 200 mg by mouth 2 (two) times daily.      Marland Kitchen atenolol (TENORMIN) 50 MG tablet Take 1 tablet (50 mg total) by mouth 2 (two) times daily.  30 tablet  6  . atorvastatin (LIPITOR) 20 MG tablet Take 1 tablet (20 mg total) by mouth daily.  30 tablet  5  . budesonide-formoterol (SYMBICORT)  160-4.5 MCG/ACT inhaler Inhale 2 puffs into the lungs 2 (two) times daily.  1 Inhaler  12  . cloNIDine (CATAPRES) 0.1 MG tablet Take 1 tablet (0.1 mg total) by mouth 2 (two) times daily.  60 tablet  11  . escitalopram (LEXAPRO) 10 MG tablet TAKE 1/2 TABLET BY MOUTH ONCE A DAY  15 tablet  2  . GLUCOSAMINE-CHONDROITIN PO Take by mouth daily.      Marland Kitchen glyBURIDE (DIABETA) 5 MG tablet Takes 2 tablets am and 2 tablets pm daily.      . insulin lispro (HUMALOG) 100 UNIT/ML injection Takes 24 units am 22 units lunch and 20 units pm daily.      Marland Kitchen JANUVIA 100 MG tablet TAKE 1 TABLET BY MOUTH ONCE A DAY  30 tablet  5  . LANTUS 100 UNIT/ML injection Take 26 units in the am and 46 units in the pm.      . levalbuterol (XOPENEX HFA) 45 MCG/ACT inhaler Inhale 2 puffs into the lungs every 4 (four) hours as needed for wheezing.  1 Inhaler  5  . Multiple Vitamin (MULTIVITAMIN) tablet Take 1 tablet by mouth daily.      . NON FORMULARY Spetravite 1 tablet daily.      . potassium chloride SA (K-DUR,KLOR-CON) 20 MEQ tablet Take 1 tablet (  20 mEq total) by mouth daily.  90 tablet  3  . pregabalin (LYRICA) 75 MG capsule Take 1 capsule (75 mg total) by mouth 2 (two) times daily.      . Tamsulosin HCl (FLOMAX) 0.4 MG CAPS Take 0.4 mg by mouth daily.       Marland Kitchen torsemide (DEMADEX) 20 MG tablet Take 2 tablets (40 mg total) by mouth 2 (two) times daily.  120 tablet  3  . Rivaroxaban (XARELTO) 20 MG TABS Take 1 tablet (20 mg total) by mouth daily.  30 tablet  6   No current facility-administered medications on file prior to visit.    Review of Systems  HENT: Positive for neck pain.   Patient denies any lightheadedness or dizziness.  Neck pain - better.  No sinus or allergy symptoms.  No chest pain or palpitations.  No increased heart rate.   No cough or congestion.  Still sob with exertion.  No nausea or vomiting.  Does have the dysphagia as outlined.  Worsening.  No significant acid reflux.  On omeprazole.  No abdominal pain or  cramping.  No bowel change, such as diarrhea, constipation, BRBPR or melana.  No urine change.   The stinging and burning of his feet improved with stopping the xarelto.  He feels that some of his weight gain is related to taking the lyrica.  We discussed decreasing the dose or switching to gabapentin - to see if improvement in the weight gain.  Still seeing Dr Franky Macho for his back.  Had MRI.  Now with increased left knee pain.  Limiting his ability to walk and exercise.         Objective:   Physical Exam  Filed Vitals:   12/27/12 0833  BP: 130/60  Pulse: 57  Temp: 98.2 F (36.8 C)   Blood pressure recheck:  74/80  77 year old male in no acute distress.  HEENT:  Nares - clear.  Oropharynx - without lesions. NECK:  Supple.  Nontender.  No audible carotid bruit.  HEART:  Appears irregularly irregular.  Rate controlled.    LUNGS:  Some crackles (dry) - left base.  Respirations even and unlabored.  No wheezing.  RADIAL PULSE:  Equal bilaterally.  ABDOMEN:  Soft.  Nontender.  Bowel sounds present and normal.  No audible abdominal bruit.    EXTREMITIES:  Swelling improved.   DP pulses palpable and equal bilaterally.  SKIN:  No lesions.     Assessment & Plan:  MSK.  Left knee pain as outlined.  Limiting his ability to walk and exercise.  Unable to take antiinflammatories.  Refer to Dr Erin Sons for evaluation and treatment.      HEALTH MAINTENANCE.  Last physical 10/30/11.  Need to schedule a physical.  Prostate and PSAs through Dr Achilles Dunk.  Colonoscopy 10/21/11 - diverticulosis and several polyps.

## 2013-01-08 ENCOUNTER — Other Ambulatory Visit: Payer: Self-pay | Admitting: Internal Medicine

## 2013-01-18 ENCOUNTER — Encounter: Payer: Self-pay | Admitting: Cardiovascular Disease

## 2013-01-18 ENCOUNTER — Ambulatory Visit (INDEPENDENT_AMBULATORY_CARE_PROVIDER_SITE_OTHER): Payer: Medicare Other | Admitting: Cardiovascular Disease

## 2013-01-18 VITALS — BP 146/68 | HR 54 | Ht 73.0 in | Wt 299.5 lb

## 2013-01-18 DIAGNOSIS — I509 Heart failure, unspecified: Secondary | ICD-10-CM

## 2013-01-18 DIAGNOSIS — J449 Chronic obstructive pulmonary disease, unspecified: Secondary | ICD-10-CM

## 2013-01-18 DIAGNOSIS — I5032 Chronic diastolic (congestive) heart failure: Secondary | ICD-10-CM

## 2013-01-18 DIAGNOSIS — I4891 Unspecified atrial fibrillation: Secondary | ICD-10-CM

## 2013-01-18 DIAGNOSIS — J4489 Other specified chronic obstructive pulmonary disease: Secondary | ICD-10-CM

## 2013-01-18 DIAGNOSIS — R609 Edema, unspecified: Secondary | ICD-10-CM

## 2013-01-18 DIAGNOSIS — R0602 Shortness of breath: Secondary | ICD-10-CM

## 2013-01-18 DIAGNOSIS — R600 Localized edema: Secondary | ICD-10-CM

## 2013-01-18 DIAGNOSIS — I10 Essential (primary) hypertension: Secondary | ICD-10-CM

## 2013-01-18 MED ORDER — AMIODARONE HCL 200 MG PO TABS: 200.0000 mg | ORAL_TABLET | Freq: Every day | ORAL | Status: DC

## 2013-01-18 NOTE — Progress Notes (Signed)
Patient ID: [name], male    DOB: Mar 20, 1935, 78 y.o.   MRN: [id number]  HPI Comments: 78 year old male with past history of morbid obesity, hypertension, hypercholesterolemia, diabetes with renal insufficiency, thrombocytopenia patient of, Dr. Lorin Picket,  episode of atrial fibrillation in early 2014.  history of COPD, obstructive sleep apnea, on CPAP.  smoked for 25 years, stopped 30 years ago. On his initial clinic visit, he was started on rate control medications for atrial fibrillation  and diuretic for significant lower extremity edema and shortness of breath. Also started on anticoagulation.  elective cardioversion on April 9 214 which was successful.  On his last clinic visit, he had significant shortness of breath. He was taking Lasix 80 mg twice a day. Also taking Xopenex and Symbicort We changed his diuretic to torsemide. Initially he was taking 40 mg twice a day but creatinine climbs to 1.8. He was told to decrease the torsemide back to 40 mg in the morning and 20 mg in the p.m..  He's had several pound weight loss 304 pounds down to 299 pounds. He reports weight at home is 292 pounds.  Breathing has been better for him. His biggest complaints are neuropathy in his legs. Lyrica has been helping his but he is concerned that the medication could be causing swelling. He continues to have some pitting edema both legs. He has significant knee and hip pain and knee surgery In general he leads a sedentary lifestyle   History of  severe neck pain. He reports having an episode of neck pain previously that seemed to resolve after an MRI. Then he developed hip pain and was seeking care from an orthopedic specialist.  He reports being compliant with his CPAP  Echocardiogram  shows normal ejection fraction with moderately dilated left atrium, high normal right ventricular systolic pressure   baseline  creatinine 1.5 to 1.8   EKG today shows normal sinus rhythm with rate 54 beats per minute, no  other significant ST or T wave changes   Past Medical History . Arthritis  . 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 Sleep apnea  . COPD (chronic obstructive pulmonary disease)  . Hypercholesterolemia  . Hypertension  . Thrombocytopenia  . Renal insufficiency  . Diverticulosis  . History of colon polyps     Outpatient Encounter Prescriptions as of 01/18/2013  Medication Sig Dispense Refill  . amiodarone (PACERONE) 200 MG tablet Take 1 tablet (200 mg total) twice a day   30 tablet  6  . atenolol (TENORMIN) 50 MG tablet Take 100 mg by mouth 2 (two) times daily.      Marland Kitchen atorvastatin (LIPITOR) 20 MG tablet Take 1 tablet (20 mg total) by mouth daily.  30 tablet  5  . budesonide-formoterol (SYMBICORT) 160-4.5 MCG/ACT inhaler Inhale 2 puffs into the lungs 2 (two) times daily.  1 Inhaler  12  . cloNIDine (CATAPRES) 0.1 MG tablet Take 1 tablet (0.1 mg total) by mouth 2 (two) times daily.  60 tablet  11  . escitalopram (LEXAPRO) 10 MG tablet TAKE 1/2 TABLET BY MOUTH ONCE A DAY  15 tablet  2  . GLUCOSAMINE-CHONDROITIN PO Take by mouth daily.      Marland Kitchen glyBURIDE (DIABETA) 5 MG tablet Takes 2 tablets am and 2 tablets pm daily.      . insulin lispro (HUMALOG) 100 UNIT/ML injection Takes 24 units am 22 units lunch and 20 units pm daily.      Marland Kitchen  JANUVIA 100 MG tablet TAKE 1 TABLET BY MOUTH ONCE A DAY  30 tablet  5  . LANTUS 100 UNIT/ML injection Take 26 units in the am and 46 units in the pm.      . levalbuterol (XOPENEX HFA) 45 MCG/ACT inhaler Inhale 2 puffs into the lungs every 4 (four) hours as needed for wheezing.  1 Inhaler  5  . Multiple Vitamin (MULTIVITAMIN) tablet Take 1 tablet by mouth daily.      . NON FORMULARY Spetravite 1 tablet daily.      Marland Kitchen omeprazole (PRILOSEC) 20 MG capsule TAKE ONE CAPSULE BY MOUTH EVERY DAY FOR REFLUX  90 capsule  1  . potassium chloride SA (K-DUR,KLOR-CON) 20 MEQ tablet Takes 1/2 tablet am and 1/2 tablet pm daily.      . pregabalin (LYRICA) 75  MG capsule Take 1 capsule (75 mg total) by mouth 2 (two) times daily.      . Tamsulosin HCl (FLOMAX) 0.4 MG CAPS Take 0.4 mg by mouth daily.       Marland Kitchen torsemide (DEMADEX) 20 MG tablet Takes 40 mg am and 20 mg pm daily.        Review of Systems  HENT: Negative.   Eyes: Negative.   Cardiovascular: Positive for leg swelling.  Gastrointestinal: Negative.   Musculoskeletal: Negative.   Skin: Negative.   Neurological: Negative.   Psychiatric/Behavioral: Negative.   All other systems reviewed and are negative.    BP 146/68  Pulse 54  Ht 6\' 1"  (1.854 m)  Wt 299 lb 8 oz (135.852 kg)  BMI 39.52 kg/m2  Physical Exam  Nursing note and vitals reviewed. Constitutional: He is oriented to person, place, and time. He appears well-developed and well-nourished.  Morbidly obese  HENT:  Head: Normocephalic.  Nose: Nose normal.  Mouth/Throat: Oropharynx is clear and moist.  Eyes: Conjunctivae are normal. Pupils are equal, round, and reactive to light.  Neck: Normal range of motion. Neck supple. No JVD present.  Cardiovascular: S1 normal, S2 normal, normal heart sounds and intact distal pulses.  An irregularly irregular rhythm present. Tachycardia present.  Exam reveals no gallop and no friction rub.   No murmur heard. 1+ pitting edema to the midshin  Pulmonary/Chest: Effort normal. No respiratory distress. He has no wheezes. He exhibits no tenderness.  Abdominal: Soft. Bowel sounds are normal. He exhibits no distension. There is no tenderness.  Musculoskeletal: Normal range of motion. He exhibits no edema and no tenderness.  Lymphadenopathy:    He has no cervical adenopathy.  Neurological: He is alert and oriented to person, place, and time. Coordination normal.  Skin: Skin is warm and dry. No rash noted. No erythema.  Psychiatric: He has a normal mood and affect. His behavior is normal. Judgment and thought content normal.      Assessment and Plan

## 2013-01-18 NOTE — Assessment & Plan Note (Signed)
Seen by Dr. Kendrick Fries. Appears better today, possibly compliant with his medications, less fluid, down several pounds.

## 2013-01-18 NOTE — Assessment & Plan Note (Signed)
Blood pressure is well controlled on today's visit. No changes made to the medications. 

## 2013-01-18 NOTE — Patient Instructions (Addendum)
You are doing well.  Continue torsemide 40 mg in the Am with 20 mg in the PM  Please decrease amiodarone to one a day  Please call us if you have new issues that need to be addressed before your next appt.  Your physician wants you to follow-up in: 6 months.  You will receive a reminder letter in the mail two months in advance. If you don't receive a letter, please call our office to schedule the follow-up appointment.

## 2013-01-18 NOTE — Assessment & Plan Note (Signed)
Residual mild edema likely from venous insufficiency. Will not push his diuretics  any further given recent renal dysfunction.

## 2013-01-18 NOTE — Assessment & Plan Note (Signed)
We have encouraged him to stay on torsemide 40 mg in the morning, 20 mg in the p.m., limit his fluid intake. He would likely feel better when mildly dehydrated. We'll need to periodically watch his basic metabolic panel

## 2013-01-18 NOTE — Assessment & Plan Note (Signed)
He continues to maintain normal sinus rhythm. We have suggested he decrease his amiodarone down to 200 mg daily

## 2013-01-24 ENCOUNTER — Telehealth: Payer: Self-pay | Admitting: *Deleted

## 2013-01-24 MED ORDER — GLUCOSE BLOOD VI STRP: ORAL_STRIP | Status: DC

## 2013-01-24 NOTE — Telephone Encounter (Signed)
done

## 2013-01-24 NOTE — Telephone Encounter (Signed)
Patient is completely out of test strips.

## 2013-01-24 NOTE — Telephone Encounter (Signed)
Refill Request  Freestyle lite test strips  #150  Use 1 strip three times a day as directed

## 2013-01-25 ENCOUNTER — Ambulatory Visit: Payer: Self-pay | Admitting: Gastroenterology

## 2013-01-28 ENCOUNTER — Ambulatory Visit: Payer: Self-pay | Admitting: Gastroenterology

## 2013-02-01 ENCOUNTER — Ambulatory Visit (INDEPENDENT_AMBULATORY_CARE_PROVIDER_SITE_OTHER): Payer: Medicare Other | Admitting: Pulmonary Disease

## 2013-02-01 VITALS — BP 136/70 | HR 52 | Temp 98.7°F | Ht 73.0 in | Wt 304.0 lb

## 2013-02-01 DIAGNOSIS — I509 Heart failure, unspecified: Secondary | ICD-10-CM

## 2013-02-01 DIAGNOSIS — J449 Chronic obstructive pulmonary disease, unspecified: Secondary | ICD-10-CM

## 2013-02-01 DIAGNOSIS — I5032 Chronic diastolic (congestive) heart failure: Secondary | ICD-10-CM

## 2013-02-01 DIAGNOSIS — R0602 Shortness of breath: Secondary | ICD-10-CM

## 2013-02-01 MED ORDER — BUDESONIDE-FORMOTEROL FUMARATE 80-4.5 MCG/ACT IN AERO: 2.0000 | INHALATION_SPRAY | Freq: Two times a day (BID) | RESPIRATORY_TRACT | Status: DC

## 2013-02-01 NOTE — Assessment & Plan Note (Signed)
Overall things look much better now that his volume status has improved. He would clearly benefit from diet and exercise.  Plan: -Continue torsemide and other medical therapy as directed by Dr. Mariah Milling

## 2013-02-01 NOTE — Progress Notes (Signed)
Subjective:    Patient ID: [name], male    DOB: [birth year], 78 y.o.   MRN: [id number]  Synopsis: [name] first saw the [facility] office in 11/2012 for COPD.  His simple spirometry was consistent with obstruction.  He previously smoked 1ppd for 34 years and quit in the 1980's.  HPI  02/01/2013 ROV >> [name] returns to clinic today stating that he feels better since the last visit now that his leg swelling is better. He had torsemide added by his cardiologist recently and his leg swelling and shortness of breath and wheezing improved. He continues to use the Symbicort twice a day and states that he has not had to use the albuterol any more than one to 2 times per week. It does help when he takes it. He is stating today that he said some headache lately and is frustrated because he says he is "lazy". He does not exercise regularly and is interested in starting a regular exercise routine.   Past Medical History  Diagnosis Date  . Arthritis   . Sleep apnea   . COPD (chronic obstructive pulmonary disease)   . Hypercholesterolemia   . Hypertension   . Thrombocytopenia   . Renal insufficiency   . Diverticulosis   . History of colon polyps   . 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 Negative for fever.  HENT: Negative for congestion, rhinorrhea and postnasal drip.   Respiratory: Positive for shortness of breath. Negative for cough and wheezing.   Cardiovascular: Positive for leg swelling. Negative for chest pain and palpitations.       Objective:   Physical Exam Filed Vitals:   02/01/13 1338  BP: 136/70  Pulse: 52  Temp: 98.7 F (37.1 C)  TempSrc: Oral  Height: 6\' 1"  (1.854 m)  Weight: 137.893 kg (304 lb)  SpO2: 94%   RA  Gen: obese, no acute distress HEENT: NCAT, EOMi, OP clear,  PULM: CTA B, normal percussion CV: RRR, no mgr, no JVD AB: BS+,  soft, nontender, no hsm Ext: warm, trace pretibial edema, no clubbing, no cyanosis     Assessment & Plan:   COPD with chronic bronchitis This is been a stable interval in terms of COPD. I think that a significant portion of his wheezing was do to volume overload which is now improved.  Plan: -Pulmonary rehabilitation referral -Decrease dose to Symbicort to a COPD dose (80/4.5 2 puffs twice a day) -Followup with me in 4-6 months  Shortness of breath Multifactorial in nature, I think there is a significant portion of volume overload which was contributing and is now improved.  Plan: -Continue torsemide as prescribed by Dr. Mariah Milling  Diastolic CHF Overall things look much better now that his volume status has improved. He would clearly benefit from diet and exercise.  Plan: -Continue torsemide and other medical therapy as directed by Dr. Mariah Milling    Updated Medication List Outpatient Encounter Prescriptions as of 02/01/2013  Medication Sig Dispense Refill  . amiodarone (PACERONE) 200 MG tablet Take 1 tablet (200 mg total) by mouth daily.  30 tablet  6  . atenolol (TENORMIN) 50 MG tablet Take 100 mg by mouth 2 (two) times daily.      Marland Kitchen atorvastatin (LIPITOR) 20 MG tablet Take 1 tablet (20 mg total) by mouth daily.  30 tablet  5  . budesonide-formoterol (  SYMBICORT) 160-4.5 MCG/ACT inhaler Inhale 2 puffs into the lungs 2 (two) times daily.  1 Inhaler  12  . cloNIDine (CATAPRES) 0.1 MG tablet Take 1 tablet (0.1 mg total) by mouth 2 (two) times daily.  60 tablet  11  . escitalopram (LEXAPRO) 10 MG tablet TAKE 1/2 TABLET BY MOUTH ONCE A DAY  15 tablet  2  . furosemide (LASIX) 20 MG tablet Take 1 tablet by mouth 2 (two) times daily.      Marland Kitchen GLUCOSAMINE-CHONDROITIN PO Take by mouth daily.      Marland Kitchen glucose blood test strip Use three times a day as instructed  150 each  5  . glyBURIDE (DIABETA) 5 MG tablet Takes 2 tablets am and 2 tablets pm daily.      . insulin lispro (HUMALOG) 100 UNIT/ML  injection Takes 24 units am 22 units lunch and 20 units pm daily.      Marland Kitchen JANUVIA 100 MG tablet TAKE 1 TABLET BY MOUTH ONCE A DAY  30 tablet  5  . LANTUS 100 UNIT/ML injection Take 26 units in the am and 46 units in the pm.      . levalbuterol (XOPENEX HFA) 45 MCG/ACT inhaler Inhale 2 puffs into the lungs every 4 (four) hours as needed for wheezing.  1 Inhaler  5  . Multiple Vitamin (MULTIVITAMIN) tablet Take 1 tablet by mouth daily.      . NON FORMULARY Spetravite 1 tablet daily.      Marland Kitchen omeprazole (PRILOSEC) 20 MG capsule TAKE ONE CAPSULE BY MOUTH EVERY DAY FOR REFLUX  90 capsule  1  . potassium chloride SA (K-DUR,KLOR-CON) 20 MEQ tablet Takes 1/2 tablet am and 1/2 tablet pm daily.      . pregabalin (LYRICA) 75 MG capsule Take 1 capsule (75 mg total) by mouth 2 (two) times daily.      . Tamsulosin HCl (FLOMAX) 0.4 MG CAPS Take 0.4 mg by mouth daily.       Marland Kitchen torsemide (DEMADEX) 20 MG tablet Takes 40 mg am and 20 mg pm daily.       No facility-administered encounter medications on file as of 02/01/2013.

## 2013-02-01 NOTE — Patient Instructions (Addendum)
We will re-refer you to pulmonary rehab  Keep taking your symbicort as you are doing, but note that next months dose will be 80/4.5, 2 puffs twice a day  We will see you back in 6 months or sooner if needed

## 2013-02-01 NOTE — Assessment & Plan Note (Addendum)
This is been a stable interval in terms of COPD. I think that a significant portion of his wheezing was do to volume overload which is now improved.  Plan: -Pulmonary rehabilitation referral -Decrease dose to Symbicort to a COPD dose (80/4.5 2 puffs twice a day) -Followup with me in 4-6 months

## 2013-02-01 NOTE — Assessment & Plan Note (Signed)
Multifactorial in nature, I think there is a significant portion of volume overload which was contributing and is now improved.  Plan: -Continue torsemide as prescribed by Dr. Mariah Milling

## 2013-02-07 ENCOUNTER — Ambulatory Visit: Payer: Self-pay | Admitting: Gastroenterology

## 2013-02-08 LAB — PATHOLOGY REPORT

## 2013-02-10 ENCOUNTER — Ambulatory Visit: Payer: Self-pay | Admitting: Gastroenterology

## 2013-02-14 ENCOUNTER — Ambulatory Visit (INDEPENDENT_AMBULATORY_CARE_PROVIDER_SITE_OTHER): Payer: Medicare Other | Admitting: Internal Medicine

## 2013-02-14 ENCOUNTER — Encounter: Payer: Self-pay | Admitting: Internal Medicine

## 2013-02-14 VITALS — BP 122/60 | HR 50 | Temp 98.7°F | Ht 73.0 in | Wt 301.2 lb

## 2013-02-14 DIAGNOSIS — I10 Essential (primary) hypertension: Secondary | ICD-10-CM

## 2013-02-14 DIAGNOSIS — E1329 Other specified diabetes mellitus with other diabetic kidney complication: Secondary | ICD-10-CM

## 2013-02-14 DIAGNOSIS — R1310 Dysphagia, unspecified: Secondary | ICD-10-CM

## 2013-02-14 DIAGNOSIS — N289 Disorder of kidney and ureter, unspecified: Secondary | ICD-10-CM

## 2013-02-14 DIAGNOSIS — G4733 Obstructive sleep apnea (adult) (pediatric): Secondary | ICD-10-CM

## 2013-02-14 DIAGNOSIS — J449 Chronic obstructive pulmonary disease, unspecified: Secondary | ICD-10-CM

## 2013-02-14 DIAGNOSIS — D696 Thrombocytopenia, unspecified: Secondary | ICD-10-CM

## 2013-02-14 DIAGNOSIS — R609 Edema, unspecified: Secondary | ICD-10-CM

## 2013-02-14 DIAGNOSIS — I4891 Unspecified atrial fibrillation: Secondary | ICD-10-CM

## 2013-02-14 DIAGNOSIS — R600 Localized edema: Secondary | ICD-10-CM

## 2013-02-14 DIAGNOSIS — E7800 Pure hypercholesterolemia, unspecified: Secondary | ICD-10-CM

## 2013-02-14 DIAGNOSIS — H539 Unspecified visual disturbance: Secondary | ICD-10-CM

## 2013-02-14 DIAGNOSIS — N058 Unspecified nephritic syndrome with other morphologic changes: Secondary | ICD-10-CM

## 2013-02-14 DIAGNOSIS — E0821 Diabetes mellitus due to underlying condition with diabetic nephropathy: Secondary | ICD-10-CM

## 2013-02-15 ENCOUNTER — Encounter: Payer: Self-pay | Admitting: Internal Medicine

## 2013-02-15 ENCOUNTER — Telehealth: Payer: Self-pay | Admitting: Internal Medicine

## 2013-02-15 DIAGNOSIS — H539 Unspecified visual disturbance: Secondary | ICD-10-CM | POA: Insufficient documentation

## 2013-02-15 NOTE — Assessment & Plan Note (Signed)
Discussed wearing support hose.  Also discussed referral to vascular surgery.  He wants to hold on referral.

## 2013-02-15 NOTE — Assessment & Plan Note (Addendum)
Blood pressure better.  Follow.  Follow metabolic panel.

## 2013-02-15 NOTE — Assessment & Plan Note (Signed)
On simvastatin.  Will check lipid profile with next fasting labs. Low cholesterol diet and exercise.

## 2013-02-15 NOTE — Assessment & Plan Note (Signed)
Using CPAP.  Follow.  

## 2013-02-15 NOTE — Assessment & Plan Note (Signed)
Sugars as outlined.  Discussed importance of diet.  Having issues with lows in the am.  See attached list.  He has decreased his Lantus to 35 units before bed.  Still with low blood sugars.  Discussed importance of eating a snack before bed.  With the increased creatinine, will decrease his glyburide to 2 tablets in the am and one in the pm.  Check and record sugars bid and send in over the next two weeks.  Will get him back in soon to reassess.  Wanted to check a1c.  He wants me to get the labs from [place] first and see what has been drawn - before labs drawn here.

## 2013-02-15 NOTE — Telephone Encounter (Signed)
He needs a follow up appt with me in 3-4 weeks ( ).  Thanks.

## 2013-02-15 NOTE — Assessment & Plan Note (Signed)
Is s/p cataract surgery.  Now seeing spider webs.  Need referral back to opthalmology.

## 2013-02-15 NOTE — Assessment & Plan Note (Signed)
Baseline creatinine previously around 1.4.  Avoid antiinflammatories and other nephrotoxic agents.  Will have to monitor renal function closely on torsemide.   He wanted to hold on any further labs until review of labs from [place].

## 2013-02-15 NOTE — Assessment & Plan Note (Signed)
Seeing Dr Gollan.  See his note for details.  Off xarelto.  Appears to be in SR.     

## 2013-02-15 NOTE — Assessment & Plan Note (Signed)
Saw GI.  Apparently had UGI and EGD.  States everything checked out fine.  Obtain results.  On Prilosec.  Controlling acid reflux.

## 2013-02-15 NOTE — Assessment & Plan Note (Signed)
Has a history of thrombocytopenia.  Last platelet count improved.  Follow.

## 2013-02-15 NOTE — Assessment & Plan Note (Signed)
Lungs clear.  No fluid.  The lower extremity edema appears to be more c/w swelling from venostasis.  Discussed the need for support hose.  We also discussed possible referral to vascular surgery.  He wants to hold on referral at this time.

## 2013-02-15 NOTE — Assessment & Plan Note (Signed)
Breathing stable.  No increased cough or congestion.  Follow.  Continue follow up with Dr McQuaid.   

## 2013-02-15 NOTE — Progress Notes (Signed)
Subjective:    Patient ID: [name], male    DOB: [birth year], 78 y.o.   MRN: [id number]  HPI 78 year old male with past history of hypertension, hypercholesterolemia, diabetes with renal insufficiency (previously followed by Dr Hyacinth Meeker) and thrombocytopenia who comes in today for a scheduled follow up.  Recently diagnosed with atrial fibrillation.  Followed by Dr Mariah Milling.  Also has known COPD and is followed by Dr Kendrick Fries.  He recently had noticed increased issues with dysphagia.  Was evaluated by GI.  Had an UGI and EGD.  States everything checked out fine.  Need to obtain results.   Denies any chest pain.  No increased heart racing or palpitations.  No increased cough or congestion.  Does report continued problems with lower extremity swelling.  Better in the am and worse as the day progresses.  Blood sugars varying.  Continued lows in the am despite adjusting his nighttime lantus.  He has decreased his Lantus to 35 units before bed. See attached list for details.  He is also seeing Dr Lavenia Atlas and Dr Erin Sons for various msk complaints.  Is s/p injections in his knees - which have helped some.  Undergoing w/up by Dr Lavenia Atlas - waiting for lab work, etc.    He also reports that he is s/p cataract surgery.  Over the last few weeks has noticed "spider webs" - in his visual field.  Has not contacted opthalmology.  No pain in the eye.  Sees Dr Druscilla Brownie.     Past Medical History  Diagnosis Date  . Arthritis   . Sleep apnea   . COPD (chronic obstructive pulmonary disease)   . Hypercholesterolemia   . Hypertension   . Thrombocytopenia   . Renal insufficiency   . Diverticulosis   . History of colon polyps   . 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 amiodarone (PACERONE) 200 MG tablet Take 1 tablet (200 mg total) by mouth daily.  30 tablet  6  .  atenolol (TENORMIN) 50 MG tablet Take 100 mg by mouth 2 (two) times daily.      . budesonide-formoterol (SYMBICORT) 80-4.5 MCG/ACT inhaler Inhale 2 puffs into the lungs 2 (two) times daily.  1 Inhaler  12  . cloNIDine (CATAPRES) 0.1 MG tablet Take 1 tablet (0.1 mg total) by mouth 2 (two) times daily.  60 tablet  11  . escitalopram (LEXAPRO) 10 MG tablet TAKE 1/2 TABLET BY MOUTH ONCE A DAY  15 tablet  2  . furosemide (LASIX) 20 MG tablet Take 1 tablet by mouth 2 (two) times daily.      Marland Kitchen GLUCOSAMINE-CHONDROITIN PO Take by mouth daily.      Marland Kitchen glucose blood test strip Use three times a day as instructed  150 each  5  . glyBURIDE (DIABETA) 5 MG tablet Takes 2 tablets am and 2 tablets pm daily.      . insulin lispro (HUMALOG) 100 UNIT/ML injection Takes 24 units am 22 units lunch and 20 units pm daily.      Marland Kitchen JANUVIA 100 MG tablet TAKE 1 TABLET BY MOUTH ONCE A DAY  30 tablet  5  . LANTUS 100 UNIT/ML injection Take 26 units in the am and 46 units in the pm.      . levalbuterol (XOPENEX HFA) 45 MCG/ACT inhaler Inhale 2  puffs into the lungs every 4 (four) hours as needed for wheezing.  1 Inhaler  5  . Multiple Vitamin (MULTIVITAMIN) tablet Take 1 tablet by mouth daily.      . NON FORMULARY Spetravite 1 tablet daily.      . potassium chloride SA (K-DUR,KLOR-CON) 20 MEQ tablet Takes 1/2 tablet am and 1/2 tablet pm daily.      . pregabalin (LYRICA) 75 MG capsule Take 1 capsule (75 mg total) by mouth 2 (two) times daily.      . Tamsulosin HCl (FLOMAX) 0.4 MG CAPS Take 0.4 mg by mouth daily.       Marland Kitchen torsemide (DEMADEX) 20 MG tablet Takes 40 mg am and 20 mg pm daily.       No current facility-administered medications on file prior to visit.    Review of Systems Patient denies any headache, lightheadedness or dizziness.  Persistent neck discomfort.  Worked up by Dr Franky Macho.  No sinus or allergy symptoms.  No chest pain or palpitations.  No increased heart rate. No cough or congestion.  Breathing stable.  No  nausea or vomiting.  No abdominal pain or cramping.  Does report problems with constipation.  Having to use enemas.  No urine change.   Still seeing Dr Franky Macho for his back.  Had MRI.  Swelling as outlined.  Sugars as outlined.       Objective:   Physical Exam  Filed Vitals:   02/14/13 1613  BP: 122/60  Pulse: 50  Temp: 98.7 F (37.1 C)   Blood pressure recheck:  63/47  78 year old male in no acute distress.  HEENT:  Nares - clear.  Oropharynx - without lesions. NECK:  Supple.  Nontender.  No audible carotid bruit.  HEART:  Appears to be regular.   Rate controlled.    LUNGS:  No crackles or wheezing audible.  Respirations even and unlabored.   RADIAL PULSE:  Equal bilaterally.  ABDOMEN:  Soft.  Nontender.  Bowel sounds present and normal.  No audible abdominal bruit.    EXTREMITIES:  Pitting edema - up to knees.  No increased erythema.   DP pulses palpable and equal bilaterally.  SKIN:  No lesions.    FEET:  No lesions.      Assessment & Plan:  MSK.  Currently being worked up and followed by Dr Lavenia Atlas and Dr Erin Sons.  Obtain records and latest labs.     HEALTH MAINTENANCE.  Prostate and PSAs through Dr Achilles Dunk.  Colonoscopy 10/21/11 - diverticulosis and several polyps.  Due follow up colonoscopy in 2016.

## 2013-02-16 ENCOUNTER — Other Ambulatory Visit: Payer: Self-pay | Admitting: *Deleted

## 2013-02-16 MED ORDER — INSULIN GLARGINE 100 UNIT/ML ~~LOC~~ SOLN: SUBCUTANEOUS | Status: DC

## 2013-02-16 NOTE — Telephone Encounter (Signed)
Pt ok to schedule appointment  Please advise date and time?

## 2013-02-16 NOTE — Telephone Encounter (Signed)
Block the 11:00 on 03/09/13 and put him in at 11:45.  Thanks.

## 2013-02-16 NOTE — Telephone Encounter (Signed)
Sent my chart message letting pt know appointment date and time °

## 2013-02-17 ENCOUNTER — Encounter: Payer: Self-pay | Admitting: *Deleted

## 2013-02-17 ENCOUNTER — Other Ambulatory Visit: Payer: Self-pay | Admitting: Internal Medicine

## 2013-02-17 ENCOUNTER — Encounter: Payer: Self-pay | Admitting: Internal Medicine

## 2013-02-17 DIAGNOSIS — E7800 Pure hypercholesterolemia, unspecified: Secondary | ICD-10-CM

## 2013-02-17 NOTE — Progress Notes (Signed)
Order placed for f/u a1c and cholesterol check.

## 2013-02-23 ENCOUNTER — Other Ambulatory Visit: Payer: Self-pay

## 2013-02-24 ENCOUNTER — Encounter: Payer: Self-pay | Admitting: Internal Medicine

## 2013-03-07 ENCOUNTER — Encounter: Payer: Self-pay | Admitting: Internal Medicine

## 2013-03-07 ENCOUNTER — Other Ambulatory Visit (INDEPENDENT_AMBULATORY_CARE_PROVIDER_SITE_OTHER): Payer: Medicare Other

## 2013-03-07 DIAGNOSIS — E1129 Type 2 diabetes mellitus with other diabetic kidney complication: Secondary | ICD-10-CM

## 2013-03-07 DIAGNOSIS — E7800 Pure hypercholesterolemia, unspecified: Secondary | ICD-10-CM

## 2013-03-07 LAB — LDL CHOLESTEROL, DIRECT: Direct LDL: 171.7 mg/dL

## 2013-03-07 LAB — LIPID PANEL
HDL: 38.5 mg/dL — ABNORMAL LOW (ref >39.00)
Total CHOL/HDL Ratio: 7
Triglycerides: 198 mg/dL — ABNORMAL HIGH (ref 0.0–149.0)

## 2013-03-08 ENCOUNTER — Ambulatory Visit: Payer: Medicare Other

## 2013-03-08 ENCOUNTER — Encounter: Payer: Self-pay | Admitting: Internal Medicine

## 2013-03-08 DIAGNOSIS — E7800 Pure hypercholesterolemia, unspecified: Secondary | ICD-10-CM

## 2013-03-08 LAB — BASIC METABOLIC PANEL
Calcium: 8.8 mg/dL (ref 8.4–10.5)
Creatinine, Ser: 1.6 mg/dL — ABNORMAL HIGH (ref 0.4–1.5)

## 2013-03-09 ENCOUNTER — Encounter: Payer: Self-pay | Admitting: Internal Medicine

## 2013-03-09 ENCOUNTER — Ambulatory Visit (INDEPENDENT_AMBULATORY_CARE_PROVIDER_SITE_OTHER): Payer: Medicare Other | Admitting: Internal Medicine

## 2013-03-09 VITALS — BP 140/70 | HR 52 | Temp 98.0°F | Ht 73.0 in | Wt 301.0 lb

## 2013-03-09 DIAGNOSIS — I4891 Unspecified atrial fibrillation: Secondary | ICD-10-CM

## 2013-03-09 DIAGNOSIS — E7800 Pure hypercholesterolemia, unspecified: Secondary | ICD-10-CM

## 2013-03-09 DIAGNOSIS — N289 Disorder of kidney and ureter, unspecified: Secondary | ICD-10-CM

## 2013-03-09 DIAGNOSIS — R600 Localized edema: Secondary | ICD-10-CM

## 2013-03-09 DIAGNOSIS — R1310 Dysphagia, unspecified: Secondary | ICD-10-CM

## 2013-03-09 DIAGNOSIS — R609 Edema, unspecified: Secondary | ICD-10-CM

## 2013-03-09 DIAGNOSIS — J4489 Other specified chronic obstructive pulmonary disease: Secondary | ICD-10-CM

## 2013-03-09 DIAGNOSIS — I10 Essential (primary) hypertension: Secondary | ICD-10-CM

## 2013-03-09 DIAGNOSIS — G4733 Obstructive sleep apnea (adult) (pediatric): Secondary | ICD-10-CM

## 2013-03-09 DIAGNOSIS — D696 Thrombocytopenia, unspecified: Secondary | ICD-10-CM

## 2013-03-09 DIAGNOSIS — E0821 Diabetes mellitus due to underlying condition with diabetic nephropathy: Secondary | ICD-10-CM

## 2013-03-09 DIAGNOSIS — J449 Chronic obstructive pulmonary disease, unspecified: Secondary | ICD-10-CM

## 2013-03-09 DIAGNOSIS — E1329 Other specified diabetes mellitus with other diabetic kidney complication: Secondary | ICD-10-CM

## 2013-03-09 DIAGNOSIS — N058 Unspecified nephritic syndrome with other morphologic changes: Secondary | ICD-10-CM

## 2013-03-10 ENCOUNTER — Telehealth: Payer: Self-pay | Admitting: *Deleted

## 2013-03-10 NOTE — Telephone Encounter (Signed)
Pt forgot to mention at his office appointment yesterday that He has been experiencing a lot of knee pain lately. Hard to get up from sitting position. He had a "expensive" shot in the past that made is symptoms worse. Wants to know what else he could try or do to help with pain. (Pt has mychart also & can reply through [employer])

## 2013-03-11 ENCOUNTER — Encounter: Payer: Self-pay | Admitting: Internal Medicine

## 2013-03-11 NOTE — Assessment & Plan Note (Signed)
Blood pressure better.  Follow.  Follow metabolic panel.  

## 2013-03-11 NOTE — Assessment & Plan Note (Signed)
Breathing stable.  No increased cough or congestion.  Follow.  Continue follow up with Dr Kendrick Fries.

## 2013-03-11 NOTE — Assessment & Plan Note (Signed)
Recent Cr improved - 1.6.  Follow.

## 2013-03-11 NOTE — Assessment & Plan Note (Signed)
Sugars attached.  Discussed importance of diet.  Having issues with some lows.  Sugars varying.  See attached list.  He has decreased his Lantus to 35 units before bed.  Still with low blood sugars.  Discussed importance of eating a snack before bed.  With the increased creatinine, will decrease his glyburide more.  Stop the evening glyburide.   Check and record sugars bid and send in over the next two weeks.  Will get him back in soon to reassess. Recent a1c improved.  Up to date with eye exams.  Followed by [facility].

## 2013-03-11 NOTE — Assessment & Plan Note (Signed)
Seeing Dr Mariah Milling.  See his note for details.  Off xarelto.  Appears to be in SR.

## 2013-03-11 NOTE — Assessment & Plan Note (Signed)
Discussed wearing support hose.  Better.  Follow.

## 2013-03-11 NOTE — Assessment & Plan Note (Signed)
Off cholesterol medication.   Cholesterol just checked and LDL and triglycerides elevated.  Discussed with him regarding the need for treatment.  Discussed starting Lipitor.  He declines.  Low cholesterol diet and exercise.  Follow.

## 2013-03-11 NOTE — Telephone Encounter (Signed)
Pt notified thru myChart.

## 2013-03-11 NOTE — Assessment & Plan Note (Signed)
Using CPAP.  Follow.  

## 2013-03-11 NOTE — Assessment & Plan Note (Signed)
Has a history of thrombocytopenia.  Last platelet count improved.  (142 on 10/12/12).   Follow.

## 2013-03-11 NOTE — Telephone Encounter (Signed)
If persistent knee pain, I would recommend a follow up with ortho.  He cannot take antiinflammatories.  If he is agreeable, let me know who he wants to see and we I can make the referral.

## 2013-03-11 NOTE — Assessment & Plan Note (Signed)
Saw GI.  Had UGI and EGD.  On Prilosec.  Controlling acid reflux.  Swallowing is better.  Follow.

## 2013-03-11 NOTE — Progress Notes (Signed)
Subjective:    Patient ID: [name], male    DOB: [birth year], 78 y.o.   MRN: [id number]  HPI 78 year old male with past history of hypertension, hypercholesterolemia, diabetes with renal insufficiency (previously followed by Dr Hyacinth Meeker) and thrombocytopenia who comes in today for a scheduled follow up.  Recently diagnosed with atrial fibrillation.  Followed by Dr Mariah Milling.  Also has known COPD and is followed by Dr Kendrick Fries.  He recently had noticed increased issues with dysphagia.  Was evaluated by GI.  Had an UGI and EGD.  States everything checked out fine. No significant acid reflux.  Swallowing is better on omeprazole.   Denies any chest pain.  No increased heart racing or palpitations.  No increased cough or congestion.  Does report continued problems with lower extremity swelling.  Better in the am and worse as the day progresses.  Overall improved.  Blood sugars varying.   He has decreased his Lantus to 35 units before bed. See attached list for details.  We decreased his glyburide last visit.  Concern with him taking this medication given his renal insufficiency.  Still with some lows.  He is also seeing Dr Lavenia Atlas and Dr Erin Sons for various msk complaints.  Is s/p injections in his knees - which have helped some.  As he was leaving, he informed the nurse that he was still having increased knee pain.   He also wants to taper off the lexapro.  Feels he no longer needs.     Past Medical History  Diagnosis Date  . Arthritis   . Sleep apnea   . COPD (chronic obstructive pulmonary disease)   . Hypercholesterolemia   . Hypertension   . Thrombocytopenia   . Renal insufficiency   . Diverticulosis   . History of colon polyps   . 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 atenolol (TENORMIN) 50 MG tablet Take 100 mg by mouth 2 (two) times daily.      .  budesonide-formoterol (SYMBICORT) 80-4.5 MCG/ACT inhaler Inhale 2 puffs into the lungs 2 (two) times daily.  1 Inhaler  12  . cloNIDine (CATAPRES) 0.1 MG tablet Take 1 tablet (0.1 mg total) by mouth 2 (two) times daily.  60 tablet  11  . escitalopram (LEXAPRO) 10 MG tablet TAKE 1/2 TABLET BY MOUTH ONCE A DAY  15 tablet  2  . GLUCOSAMINE-CHONDROITIN PO Take by mouth daily.      Marland Kitchen glucose blood test strip Use three times a day as instructed  150 each  5  . glyBURIDE (DIABETA) 5 MG tablet Takes 2 tablets am and 2 tablet pm daily.      . insulin glargine (LANTUS) 100 UNIT/ML injection Take 26 units in the am and 46 units in the pm.  10 mL  5  . insulin lispro (HUMALOG) 100 UNIT/ML injection Takes 24 units am 22 units lunch and 14 units pm daily.      Marland Kitchen JANUVIA 100 MG tablet TAKE 1 TABLET BY MOUTH ONCE A DAY  30 tablet  5  . levalbuterol (XOPENEX HFA) 45 MCG/ACT inhaler Inhale 2 puffs into the lungs every 4 (four) hours as needed for wheezing.  1 Inhaler  5  . Multiple Vitamin (MULTIVITAMIN) tablet Take 1 tablet by mouth daily.      . potassium chloride SA (K-DUR,KLOR-CON)  20 MEQ tablet Takes 1/2 tablet am and 1/2 tablet pm daily.      . pregabalin (LYRICA) 75 MG capsule Take 1 capsule (75 mg total) by mouth 2 (two) times daily.      . Tamsulosin HCl (FLOMAX) 0.4 MG CAPS Take 0.4 mg by mouth daily.       Marland Kitchen torsemide (DEMADEX) 20 MG tablet Takes 40 mg am and 20 mg pm daily.       No current facility-administered medications on file prior to visit.    Review of Systems Patient denies any headache, lightheadedness or dizziness.  No sinus or allergy symptoms.  No chest pain or palpitations.  No increased heart rate. No cough or congestion.  Breathing stable.  No nausea or vomiting.  No abdominal pain or cramping.  Has had issues with constipation.   No urine change.   Still seeing Dr Franky Macho for his back.  Had MRI.  Swelling as outlined.  Have improved.  Sugars as outlined.  a1c just checked - improved.        Objective:   Physical Exam  Filed Vitals:   03/09/13 1152  BP: 140/70  Pulse: 52  Temp: 98 F (36.7 C)   Blood pressure recheck:  138/68, pulse 66  78 year old male in no acute distress.  HEENT:  Nares - clear.  Oropharynx - without lesions. NECK:  Supple.  Nontender.  No audible carotid bruit.  HEART:  Appears to be regular.   Rate controlled.    LUNGS:  No crackles or wheezing audible.  Respirations even and unlabored.   RADIAL PULSE:  Equal bilaterally.  ABDOMEN:  Soft.  Nontender.  Bowel sounds present and normal.  No audible abdominal bruit.    EXTREMITIES:  Pitting edema - up to knees.  No increased erythema.   DP pulses palpable and equal bilaterally.  SKIN:  No lesions.    FEET:  No lesions.      Assessment & Plan:  MSK.  Currently being worked up and followed by Dr Lavenia Atlas and Dr Erin Sons.  Given persistent knee pain, needs referral back.    HEALTH MAINTENANCE.  Prostate and PSAs through Dr Achilles Dunk.  Colonoscopy 10/21/11 - diverticulosis and several polyps.  Due follow up colonoscopy in 2016.

## 2013-03-14 ENCOUNTER — Telehealth: Payer: Self-pay | Admitting: Internal Medicine

## 2013-03-14 MED ORDER — GLYBURIDE 5 MG PO TABS: ORAL_TABLET | ORAL | Status: DC

## 2013-03-14 NOTE — Telephone Encounter (Signed)
Refilled glyburide #60 with 2 refills.  (2 qam)

## 2013-03-31 ENCOUNTER — Encounter: Payer: Self-pay | Admitting: Internal Medicine

## 2013-04-29 ENCOUNTER — Other Ambulatory Visit: Payer: Self-pay | Admitting: Internal Medicine

## 2013-05-02 ENCOUNTER — Other Ambulatory Visit: Payer: Self-pay | Admitting: Internal Medicine

## 2013-05-03 ENCOUNTER — Encounter: Payer: Self-pay | Admitting: Internal Medicine

## 2013-05-03 ENCOUNTER — Ambulatory Visit (INDEPENDENT_AMBULATORY_CARE_PROVIDER_SITE_OTHER): Payer: Medicare Other | Admitting: Internal Medicine

## 2013-05-03 VITALS — BP 120/80 | HR 51 | Temp 98.6°F | Ht 73.0 in | Wt 300.5 lb

## 2013-05-03 DIAGNOSIS — D696 Thrombocytopenia, unspecified: Secondary | ICD-10-CM

## 2013-05-03 DIAGNOSIS — R600 Localized edema: Secondary | ICD-10-CM

## 2013-05-03 DIAGNOSIS — E0821 Diabetes mellitus due to underlying condition with diabetic nephropathy: Secondary | ICD-10-CM

## 2013-05-03 DIAGNOSIS — Z23 Encounter for immunization: Secondary | ICD-10-CM

## 2013-05-03 DIAGNOSIS — G4733 Obstructive sleep apnea (adult) (pediatric): Secondary | ICD-10-CM

## 2013-05-03 DIAGNOSIS — I10 Essential (primary) hypertension: Secondary | ICD-10-CM

## 2013-05-03 DIAGNOSIS — N289 Disorder of kidney and ureter, unspecified: Secondary | ICD-10-CM

## 2013-05-03 DIAGNOSIS — R609 Edema, unspecified: Secondary | ICD-10-CM

## 2013-05-03 DIAGNOSIS — N058 Unspecified nephritic syndrome with other morphologic changes: Secondary | ICD-10-CM

## 2013-05-03 DIAGNOSIS — E1329 Other specified diabetes mellitus with other diabetic kidney complication: Secondary | ICD-10-CM

## 2013-05-03 DIAGNOSIS — J449 Chronic obstructive pulmonary disease, unspecified: Secondary | ICD-10-CM

## 2013-05-03 DIAGNOSIS — I4891 Unspecified atrial fibrillation: Secondary | ICD-10-CM

## 2013-05-03 DIAGNOSIS — E7800 Pure hypercholesterolemia, unspecified: Secondary | ICD-10-CM

## 2013-05-03 DIAGNOSIS — R1310 Dysphagia, unspecified: Secondary | ICD-10-CM

## 2013-05-03 DIAGNOSIS — J4489 Other specified chronic obstructive pulmonary disease: Secondary | ICD-10-CM

## 2013-05-03 NOTE — Telephone Encounter (Signed)
Eprescribed.

## 2013-05-04 ENCOUNTER — Other Ambulatory Visit: Payer: Self-pay | Admitting: *Deleted

## 2013-05-05 ENCOUNTER — Other Ambulatory Visit: Payer: Self-pay | Admitting: *Deleted

## 2013-05-05 MED ORDER — CLONIDINE HCL 0.1 MG PO TABS: 0.1000 mg | ORAL_TABLET | Freq: Two times a day (BID) | ORAL | Status: DC

## 2013-05-05 MED ORDER — SITAGLIPTIN PHOSPHATE 100 MG PO TABS: ORAL_TABLET | ORAL | Status: DC

## 2013-05-05 NOTE — Telephone Encounter (Signed)
Requested Prescriptions   Signed Prescriptions Disp Refills  . cloNIDine (CATAPRES) 0.1 MG tablet 180 tablet 3    Sig: Take 1 tablet (0.1 mg total) by mouth 2 (two) times daily.    Authorizing Provider: Antonieta Iba    Ordering User: Kendrick Fries

## 2013-05-08 ENCOUNTER — Encounter: Payer: Self-pay | Admitting: Internal Medicine

## 2013-05-08 NOTE — Assessment & Plan Note (Signed)
Using CPAP.  Follow.  

## 2013-05-08 NOTE — Assessment & Plan Note (Signed)
Recent Cr stable around 1.6.  Avoid antiinflammatories and other nephrotoxic agents.  Follow.  Recheck metabolic panel.

## 2013-05-08 NOTE — Assessment & Plan Note (Signed)
Off cholesterol medication.   Cholesterol just checked and LDL and triglycerides elevated.  Discussed with him regarding the need for treatment.  Discussed starting Lipitor.  He declines.  Low cholesterol diet and exercise.  Follow.

## 2013-05-08 NOTE — Assessment & Plan Note (Signed)
Breathing stable.  No increased cough or congestion.  Follow.  Continue follow up with Dr Kendrick Fries.

## 2013-05-08 NOTE — Assessment & Plan Note (Signed)
Brought in no sugar readings.    Discussed importance of diet.  Having issues with some lows.  Sugars varying.   He has decreased his Lantus to 35 units before bed.  Still with low blood sugars.  Discussed importance of eating a snack before bed, eating regular meals and not going long periods without eating.   With the increased creatinine, will decrease his glyburide more.  Will decrease his glyburide to just one tablet in the am.   Check and record sugars bid and send in over the next two weeks.  Will get him back in soon to reassess.   Up to date with eye exams.  Followed by [facility].

## 2013-05-08 NOTE — Assessment & Plan Note (Signed)
Seeing Dr Mariah Milling.  See his note for details.  Off xarelto.  Appears to be in SR.

## 2013-05-08 NOTE — Assessment & Plan Note (Signed)
Blood pressure better.  Follow.  Follow metabolic panel.

## 2013-05-08 NOTE — Progress Notes (Signed)
Subjective:    Patient ID: [name], male    DOB: 23-Oct-1934, 78 y.o.   MRN: [id number]  HPI 78 year old male with past history of hypertension, hypercholesterolemia, diabetes with renal insufficiency (previously followed by Dr Hyacinth Meeker) and thrombocytopenia who comes in today for a scheduled follow up.  Recently diagnosed with atrial fibrillation.  Followed by Dr Mariah Milling.  Also has known COPD and is followed by Dr Kendrick Fries.  He recently had noticed increased issues with dysphagia.  Was evaluated by GI.  Had an UGI and EGD.  States everything checked out fine. No significant acid reflux.  Swallowing is better on omeprazole.   Denies any chest pain.  No increased heart racing or palpitations.  No increased cough or congestion.  Does report continued problems with lower extremity swelling.  Better in the am and worse as the day progresses.  Overall improved.  Blood sugars varying.   He has decreased his Lantus to 35 units before bed.   Brought in no recorded sugar readings.    We decreased his glyburide last visit.  Concern with him taking this medication given his renal insufficiency.  Still with some lows.  He is not eating regular meals.  Gets up late.  States his am sugars are averaging 95-140.  Will go long periods without eating.  He is also seeing Dr Lavenia Atlas and Dr Erin Sons for various msk complaints.  Is s/p injections in his knees - which have helped some.  Hs is apparently schedule for an MRI in the near future.  States is not receiving the gadolenium.   He saw Dr Eulah Pont as well.  He placed him on Nucynta.  This has helped his pain.  Also taking Lyrica.     Past Medical History  Diagnosis Date  . Arthritis   . Sleep apnea   . COPD (chronic obstructive pulmonary disease)   . Hypercholesterolemia   . Hypertension   . Thrombocytopenia   . Renal insufficiency   . Diverticulosis   . History of colon polyps   . 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 amiodarone (PACERONE) 200 MG tablet Take 200 mg by mouth 2 (two) times daily.      Marland Kitchen atenolol (TENORMIN) 50 MG tablet Take 100 mg by mouth 2 (two) times daily.      Marland Kitchen atorvastatin (LIPITOR) 20 MG tablet Take 20 mg by mouth daily.      . budesonide-formoterol (SYMBICORT) 80-4.5 MCG/ACT inhaler Inhale 2 puffs into the lungs 2 (two) times daily.  1 Inhaler  12  . glucose blood test strip Use three times a day as instructed  150 each  5  . HUMALOG 100 UNIT/ML injection INJECT 24 UNITS AT BREAKFAST, 22 UNITS AT LUNCH & 14 UNITS AT SUPPER  30 mL  3  . levalbuterol (XOPENEX HFA) 45 MCG/ACT inhaler Inhale 2 puffs into the lungs every 4 (four) hours as needed for wheezing.  1 Inhaler  5  . LYRICA 75 MG capsule TAKE 1 CAPSULE TWICE DAILY  60 capsule  5  . Multiple Vitamin (MULTIVITAMIN) tablet Take 1 tablet by mouth daily.      . potassium chloride SA (K-DUR,KLOR-CON) 20 MEQ tablet Takes 1/2 tablet am and 1/2 tablet pm daily.      . Tamsulosin HCl (FLOMAX) 0.4 MG CAPS Take  0.4 mg by mouth daily.       Marland Kitchen torsemide (DEMADEX) 20 MG tablet Takes 40 mg am and 20 mg pm daily.       No current facility-administered medications on file prior to visit.    Review of Systems Patient denies any headache, lightheadedness or dizziness.  No sinus or allergy symptoms.  No chest pain or palpitations.  No increased heart rate. No cough or congestion.  Breathing stable.  No nausea or vomiting.  No abdominal pain or cramping.  Has had issues with constipation.   No urine change.   Still seeing Dr Franky Macho for his back.  Had MRI.  Swelling as outlined.  Has improved.  Sugars as outlined.  Did not bring in recorded readings.  Apparently having some issues with lows.  He is not eating regular meals and will go long periods without eating.  Persistent knee pain as outlined.        Objective:   Physical Exam  Filed Vitals:    05/03/13 1132  BP: 120/80  Pulse: 51  Temp: 98.6 F (37 C)   Blood pressure recheck:  69/65  78 year old male in no acute distress.  HEENT:  Nares - clear.  Oropharynx - without lesions. NECK:  Supple.  Nontender.  No audible carotid bruit.  HEART:  Appears to be regular.   Rate controlled.    LUNGS:  No crackles or wheezing audible.  Respirations even and unlabored.   RADIAL PULSE:  Equal bilaterally.  ABDOMEN:  Soft.  Nontender.  Bowel sounds present and normal.  No audible abdominal bruit.    EXTREMITIES:  Pitting edema - stable.   No increased erythema.   DP pulses palpable and equal bilaterally.  SKIN:  No lesions.    FEET:  No lesions.      Assessment & Plan:  MSK.  Currently being worked up and followed by Dr Lavenia Atlas and Dr Erin Sons.  Also apparently has seen Dr Eulah Pont.  On Nucynta now.  This has helped.  Planning for non contrast MRI soon.     HEALTH MAINTENANCE.  Prostate and PSAs through Dr Achilles Dunk.  Colonoscopy 10/21/11 - diverticulosis and several polyps. Due follow up colonoscopy in 2016.

## 2013-05-08 NOTE — Assessment & Plan Note (Signed)
Discussed wearing support hose.  Better.  Follow.  

## 2013-05-08 NOTE — Assessment & Plan Note (Signed)
Saw GI.  Had UGI and EGD.  On Prilosec.  Controlling acid reflux.  Swallowing is better.  Follow.

## 2013-05-08 NOTE — Assessment & Plan Note (Signed)
Has a history of thrombocytopenia.  Last platelet count improved.  (142 on 10/12/12).   Follow.

## 2013-05-20 ENCOUNTER — Telehealth: Payer: Self-pay | Admitting: Internal Medicine

## 2013-05-20 NOTE — Telephone Encounter (Signed)
The patient is wanting to know if it is ok to have his partial knee replacement done. [facility] Dr. Erin Sons in [place] is needing medical clearance .

## 2013-05-22 NOTE — Telephone Encounter (Signed)
Will need to be seen prior to surgery.  He will also need cardiology clearance and recs prior to surgery.  When is the planned surgery.  Will need referral to cardiology if no appt scheduled soon.

## 2013-05-23 ENCOUNTER — Telehealth: Payer: Self-pay | Admitting: *Deleted

## 2013-05-23 NOTE — Telephone Encounter (Signed)
Pt currently has a f/u appt on 11/25. Please let me know if you would like to get him in earlier.

## 2013-05-23 NOTE — Telephone Encounter (Signed)
Patient called re: surgical clearance from [facility] for knee surgery. Please advise

## 2013-05-23 NOTE — Telephone Encounter (Signed)
Dr. Guillermina [place] office faxed over a paper to request patient be seen in office before surgery. They are wanting surgery as soon as possible but can't schedule actual surgery until after he is seen here in the office and all the notes are faxed over. I placed the faxed paper on your desk about pt from Dr. Guillermina [place] office.

## 2013-05-23 NOTE — Telephone Encounter (Signed)
Pt notified of appt & aware to get clearance from Dr. Mariah Milling also (Notified Velna Hatchet @ [facility] also)

## 2013-05-23 NOTE — Telephone Encounter (Signed)
Duplicate.  I can see him on 11/10 at 11:45.

## 2013-05-23 NOTE — Telephone Encounter (Signed)
I can see him on 11/10 at 11:45.  Please also inform him that he will need cardiology clearance.

## 2013-05-24 NOTE — Telephone Encounter (Signed)
Faxed clearance (pt acceptable risk for surgery) to [facility] ortho at [phone].

## 2013-05-26 ENCOUNTER — Other Ambulatory Visit: Payer: Self-pay | Admitting: Internal Medicine

## 2013-05-26 NOTE — Telephone Encounter (Signed)
Okay to refill? Same direction?

## 2013-05-26 NOTE — Telephone Encounter (Signed)
I don't see this on his list of meds previously.  I don't think I have ever prescribed this for him.  He needs to get this from the md prescribing.

## 2013-05-28 ENCOUNTER — Other Ambulatory Visit: Payer: Self-pay | Admitting: Cardiovascular Disease

## 2013-05-30 ENCOUNTER — Encounter: Payer: Self-pay | Admitting: Internal Medicine

## 2013-05-30 ENCOUNTER — Other Ambulatory Visit: Payer: Self-pay | Admitting: *Deleted

## 2013-05-30 ENCOUNTER — Ambulatory Visit (INDEPENDENT_AMBULATORY_CARE_PROVIDER_SITE_OTHER): Payer: Medicare Other | Admitting: Internal Medicine

## 2013-05-30 VITALS — BP 148/66 | HR 56 | Temp 97.9°F | Resp 18 | Wt 313.5 lb

## 2013-05-30 DIAGNOSIS — D696 Thrombocytopenia, unspecified: Secondary | ICD-10-CM

## 2013-05-30 DIAGNOSIS — R600 Localized edema: Secondary | ICD-10-CM

## 2013-05-30 DIAGNOSIS — E0821 Diabetes mellitus due to underlying condition with diabetic nephropathy: Secondary | ICD-10-CM

## 2013-05-30 DIAGNOSIS — R609 Edema, unspecified: Secondary | ICD-10-CM

## 2013-05-30 DIAGNOSIS — G4733 Obstructive sleep apnea (adult) (pediatric): Secondary | ICD-10-CM

## 2013-05-30 DIAGNOSIS — J449 Chronic obstructive pulmonary disease, unspecified: Secondary | ICD-10-CM

## 2013-05-30 DIAGNOSIS — N058 Unspecified nephritic syndrome with other morphologic changes: Secondary | ICD-10-CM

## 2013-05-30 DIAGNOSIS — Z01818 Encounter for other preprocedural examination: Secondary | ICD-10-CM

## 2013-05-30 DIAGNOSIS — I509 Heart failure, unspecified: Secondary | ICD-10-CM

## 2013-05-30 DIAGNOSIS — I5030 Unspecified diastolic (congestive) heart failure: Secondary | ICD-10-CM

## 2013-05-30 DIAGNOSIS — E1329 Other specified diabetes mellitus with other diabetic kidney complication: Secondary | ICD-10-CM

## 2013-05-30 DIAGNOSIS — N289 Disorder of kidney and ureter, unspecified: Secondary | ICD-10-CM

## 2013-05-30 DIAGNOSIS — I4891 Unspecified atrial fibrillation: Secondary | ICD-10-CM

## 2013-05-30 DIAGNOSIS — I10 Essential (primary) hypertension: Secondary | ICD-10-CM

## 2013-05-30 MED ORDER — TORSEMIDE 20 MG PO TABS: ORAL_TABLET | ORAL | Status: DC

## 2013-05-30 NOTE — Telephone Encounter (Signed)
Requested Prescriptions   Signed Prescriptions Disp Refills  . torsemide (DEMADEX) 20 MG tablet 90 tablet 3    Sig: Takes 40 mg am and 20 mg pm daily.    Authorizing Provider: Antonieta Iba    Ordering User: Kendrick Fries

## 2013-05-30 NOTE — Progress Notes (Signed)
Pre visit review using our clinic review tool, if applicable. No additional management support is needed unless otherwise documented below in the visit note. 

## 2013-06-01 ENCOUNTER — Encounter: Payer: Self-pay | Admitting: Internal Medicine

## 2013-06-01 DIAGNOSIS — Z01818 Encounter for other preprocedural examination: Secondary | ICD-10-CM | POA: Insufficient documentation

## 2013-06-01 NOTE — Assessment & Plan Note (Signed)
Seeing Dr Mariah Milling.  See his note for details.  Off xarelto.  Appears to be in SR.  Will need cardiac clearance as outlined.

## 2013-06-01 NOTE — Assessment & Plan Note (Signed)
Breathing stable.  No increased cough or congestion.  Follow.  Continue follow up with Dr Kendrick Fries.   Using the inhalers regularly now.  Will need to continue regular use of inhalers as discussed.

## 2013-06-01 NOTE — Assessment & Plan Note (Signed)
Lungs clear.  No fluid.  The lower extremity edema appears to be more c/w swelling from venostasis.  Discussed the need for support hose.

## 2013-06-01 NOTE — Assessment & Plan Note (Signed)
Would recommend checking a cbc prior to his surgery.  Last platelet count improved - 142.

## 2013-06-01 NOTE — Assessment & Plan Note (Signed)
Discussed wearing support hose.  Better.  Follow.

## 2013-06-01 NOTE — Assessment & Plan Note (Addendum)
Sugar readings attached.  No lows recently reported.   Discussed importance of diet.   Sugars have been varying.   Discussed importance of eating a snack before bed, eating regular meals and not going long periods without eating.   Insulin adjustment per protocol prior to surgery.

## 2013-06-01 NOTE — Assessment & Plan Note (Signed)
Blood pressure has been under reasonable control.  Follow.  Check metabolic panel.

## 2013-06-01 NOTE — Assessment & Plan Note (Signed)
Recent Cr stable around 1.6.  Avoid antiinflammatories and other nephrotoxic agents.  Follow.

## 2013-06-01 NOTE — Assessment & Plan Note (Addendum)
He is planning for knee surgery soon.  He will need cardiology clearance from Dr Mariah Milling.  He feels his breathing is stable.  He is using his inhalers on a regular basis now.  Has helped.  We discussed the need for continued regular use of his inhalers and the importance of using them in the peri op period.  He will also need close intra op and post op monitoring of his heart rate and blood pressure to avoid extremes.  He will need adjustments in his insulin and diabetic meds in the pre op and peri op period.  He has a history of fluctuating sugars.  He does have known sleep apnea and will need close monitoring of his oxygen level during surgery and during the post op period.  Given his multiple medical problems, I do feel he is at moderate risk to proceed with surgery.  He will need cardiology clearance and their recommendations.  My recommendations as outlined.

## 2013-06-01 NOTE — Assessment & Plan Note (Signed)
Using CPAP.  Follow.  See recommendations.

## 2013-06-01 NOTE — Progress Notes (Signed)
Subjective:    Patient ID: [name], male    DOB: [birth year], 78 y.o.   MRN: [id number]  HPI 78 year old male with past history of hypertension, hypercholesterolemia, diabetes with renal insufficiency (previously followed by Dr Hyacinth Meeker) and thrombocytopenia who comes in today as a work in for a pre op evaluation.   Recently diagnosed with atrial fibrillation.  Followed by Dr Mariah Milling.  Also has known COPD and is followed by Dr Kendrick Fries.  He recently had noticed increased issues with dysphagia.  Was evaluated by GI.  Had an UGI and EGD.  States everything checked out fine. No significant acid reflux.  Swallowing is better on omeprazole.   Denies any chest pain.  No increased heart racing or palpitations.  No increased cough or congestion.  Does report continued problems with lower extremity swelling.  Better in the am and worse as the day progresses.  Overall improved.  Blood sugars varying.   See attached list for details.  No reported lows today.   He is not eating regular meals.  Gets up late.   Will go long periods without eating.  He is also seeing Dr Lavenia Atlas and Dr Erin Sons for various msk complaints.  Is s/p injections in his knees - which he reports today - "did not help".  He is having increased pain in his right knee and in his hips.  Knee mostly bothering him now.  He is planning to have knee surgery soon.  The pain limits his activity.  Limits him standing.       Past Medical History  Diagnosis Date  . Arthritis   . Sleep apnea   . COPD (chronic obstructive pulmonary disease)   . Hypercholesterolemia   . Hypertension   . Thrombocytopenia   . Renal insufficiency   . Diverticulosis   . History of colon polyps   . 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 amiodarone (PACERONE) 200 MG tablet Take 200 mg by mouth 2 (two) times daily.      Marland Kitchen atenolol  (TENORMIN) 50 MG tablet Take 100 mg by mouth 2 (two) times daily.      Marland Kitchen atorvastatin (LIPITOR) 20 MG tablet Take 20 mg by mouth daily.      . budesonide-formoterol (SYMBICORT) 80-4.5 MCG/ACT inhaler Inhale 2 puffs into the lungs 2 (two) times daily.  1 Inhaler  12  . cloNIDine (CATAPRES) 0.1 MG tablet Take 1 tablet (0.1 mg total) by mouth 2 (two) times daily.  180 tablet  3  . escitalopram (LEXAPRO) 10 MG tablet TAKE 1/2 TABLET BY MOUTH EVERY OTHER DAY      . esomeprazole (NEXIUM) 40 MG capsule Take 40 mg by mouth daily before breakfast.      . glucose blood test strip Use three times a day as instructed  150 each  5  . glyBURIDE (DIABETA) 5 MG tablet Take 5 mg by mouth daily with breakfast. Takes 2 tablets q am & 1 Q PM      . HUMALOG 100 UNIT/ML injection INJECT 24 UNITS AT BREAKFAST, 22 UNITS AT LUNCH & 14 UNITS AT SUPPER  30 mL  3  . insulin glargine (LANTUS) 100 UNIT/ML injection TAKE 26 UNITS IN THE AM AND 35 UNITS IN THE PM.      . levalbuterol (XOPENEX HFA) 45 MCG/ACT inhaler  Inhale 2 puffs into the lungs every 4 (four) hours as needed for wheezing.  1 Inhaler  5  . LYRICA 75 MG capsule TAKE 1 CAPSULE TWICE DAILY  60 capsule  5  . Multiple Vitamin (MULTIVITAMIN) tablet Take 1 tablet by mouth daily.      . potassium chloride SA (K-DUR,KLOR-CON) 20 MEQ tablet Takes 1/2 tablet am and 1/2 tablet pm daily.      . sitaGLIPtin (JANUVIA) 100 MG tablet TAKE 1 TABLET BY MOUTH ONCE A DAY  30 tablet  5  . Tamsulosin HCl (FLOMAX) 0.4 MG CAPS Take 0.4 mg by mouth daily.       . hyoscyamine (LEVBID) 0.375 MG 12 hr tablet 1/2 TABLET BY MOUTH TWICE A DAY FOR 1 WEEK, THEN INCREASE TO 1 TABLET TWICE A DAY  60 tablet  1  . Tapentadol HCl (NUCYNTA PO) Take by mouth 2 (two) times daily as needed (pain).       No current facility-administered medications on file prior to visit.    Review of Systems Patient denies any headache, lightheadedness or dizziness.  No sinus or allergy symptoms.  No chest pain or  palpitations.  No increased heart rate.  No cough or congestion.  Breathing stable.  Has been using his inhalers on a regular basis now.   No nausea or vomiting.  No abdominal pain or cramping.   No urine change.   Still seeing Dr Franky Macho for his back.  Sugar readings attached.  Varying.  He is not eating regular meals and will go long periods without eating.  No reported lows today.  Persistent knee pain as outlined.  Limits his activity.  Planning for knee surgery soon.        Objective:   Physical Exam  Filed Vitals:   05/30/13 1151  BP: 148/66  Pulse: 56  Temp: 97.9 F (36.6 C)  Resp: 18   Blood pressure recheck:  53/108  78 year old male in no acute distress.  HEENT:  Nares - clear.  Oropharynx - without lesions. NECK:  Supple.  Nontender.  No audible carotid bruit.  HEART:  Appears to be regular.   Rate controlled.    LUNGS:  No crackles or wheezing audible.  Respirations even and unlabored.   RADIAL PULSE:  Equal bilaterally.  ABDOMEN:  Soft.  Nontender.  Bowel sounds present and normal.  No audible abdominal bruit.    EXTREMITIES:  Pitting edema - stable.   No increased erythema.   DP pulses palpable and equal bilaterally.  SKIN:  No lesions.    FEET:  No lesions.      Assessment & Plan:  MSK.  Currently being worked up and followed by Dr Lavenia Atlas and Dr Erin Sons.  Also apparently has seen Dr Eulah Pont.  On Nucynta now.  With increased knee pain as outlined.  Limits his activity.  See above.  Planning for knee surgery.     HEALTH MAINTENANCE.  Prostate and PSAs through Dr Achilles Dunk.  Colonoscopy 10/21/11 - diverticulosis and several polyps. Due follow up colonoscopy in 2016.    I spent more than 40 minutes with this patient and more than 50% of the time was spent in consultation regarding the above.

## 2013-06-06 ENCOUNTER — Other Ambulatory Visit (INDEPENDENT_AMBULATORY_CARE_PROVIDER_SITE_OTHER): Payer: Medicare Other

## 2013-06-06 DIAGNOSIS — I4891 Unspecified atrial fibrillation: Secondary | ICD-10-CM

## 2013-06-06 DIAGNOSIS — E1329 Other specified diabetes mellitus with other diabetic kidney complication: Secondary | ICD-10-CM

## 2013-06-06 DIAGNOSIS — E0821 Diabetes mellitus due to underlying condition with diabetic nephropathy: Secondary | ICD-10-CM

## 2013-06-06 DIAGNOSIS — N289 Disorder of kidney and ureter, unspecified: Secondary | ICD-10-CM

## 2013-06-06 DIAGNOSIS — D696 Thrombocytopenia, unspecified: Secondary | ICD-10-CM

## 2013-06-06 DIAGNOSIS — N058 Unspecified nephritic syndrome with other morphologic changes: Secondary | ICD-10-CM

## 2013-06-06 DIAGNOSIS — E7800 Pure hypercholesterolemia, unspecified: Secondary | ICD-10-CM

## 2013-06-06 LAB — CBC WITH DIFFERENTIAL/PLATELET
Basophils Absolute: 0 10*3/uL (ref 0.0–0.1)
Basophils Relative: 0 % (ref 0.0–3.0)
Eosinophils Absolute: 0 10*3/uL (ref 0.0–0.7)
Eosinophils Relative: 0.3 % (ref 0.0–5.0)
HCT: 35.6 % — ABNORMAL LOW (ref 39.0–52.0)
Hemoglobin: 12 g/dL — ABNORMAL LOW (ref 13.0–17.0)
MCHC: 33.7 g/dL (ref 30.0–36.0)
MCV: 95.8 fl (ref 78.0–100.0)
RBC: 3.72 Mil/uL — ABNORMAL LOW (ref 4.22–5.81)
WBC: 6.6 10*3/uL (ref 4.5–10.5)

## 2013-06-06 LAB — LIPID PANEL
Cholesterol: 167 mg/dL (ref 0–200)
Triglycerides: 117 mg/dL (ref 0.0–149.0)

## 2013-06-06 LAB — BASIC METABOLIC PANEL
CO2: 27 mEq/L (ref 19–32)
Chloride: 108 mEq/L (ref 96–112)
Potassium: 4.6 mEq/L (ref 3.5–5.1)
Sodium: 141 mEq/L (ref 135–145)

## 2013-06-06 LAB — TSH: TSH: 2.95 u[IU]/mL (ref 0.35–5.50)

## 2013-06-06 LAB — HEPATIC FUNCTION PANEL
ALT: 21 U/L (ref 0–53)
AST: 23 U/L (ref 0–37)
Alkaline Phosphatase: 78 U/L (ref 39–117)
Bilirubin, Direct: 0.2 mg/dL (ref 0.0–0.3)
Total Bilirubin: 0.9 mg/dL (ref 0.3–1.2)
Total Protein: 6 g/dL (ref 6.0–8.3)

## 2013-06-06 LAB — MICROALBUMIN / CREATININE URINE RATIO: Creatinine,U: 135.8 mg/dL

## 2013-06-07 ENCOUNTER — Encounter: Payer: Self-pay | Admitting: Internal Medicine

## 2013-06-09 NOTE — Telephone Encounter (Signed)
Mailed unread message to pt  

## 2013-06-14 ENCOUNTER — Ambulatory Visit: Payer: Self-pay | Admitting: Unknown Physician Specialty

## 2013-06-14 ENCOUNTER — Ambulatory Visit: Payer: Medicare Other | Admitting: Internal Medicine

## 2013-06-20 HISTORY — PX: MEDIAL PARTIAL KNEE REPLACEMENT: SHX5965

## 2013-06-22 ENCOUNTER — Telehealth: Payer: Self-pay

## 2013-06-22 NOTE — Telephone Encounter (Signed)
Spoke w/ Romeo Apple.  Clarified that per pt's last ov in 7/14, pt is to be on one amiodarone daily.

## 2013-06-22 NOTE — Telephone Encounter (Signed)
Needs clarification on pt medication amiodarone. Please call.

## 2013-06-24 ENCOUNTER — Inpatient Hospital Stay: Payer: Self-pay | Admitting: Internal Medicine

## 2013-06-24 LAB — URINALYSIS, COMPLETE
Bacteria: NONE SEEN
Glucose,UR: 50 mg/dL (ref 0–75)
Ketone: NEGATIVE
Nitrite: NEGATIVE
Ph: 5 (ref 4.5–8.0)
Protein: 100
Specific Gravity: 1.015 (ref 1.003–1.030)
Squamous Epithelial: NONE SEEN

## 2013-06-24 LAB — CBC
HGB: 10.4 g/dL — ABNORMAL LOW (ref 13.0–18.0)
MCH: 32.3 pg (ref 26.0–34.0)
Platelet: 96 10*3/uL — ABNORMAL LOW (ref 150–440)
WBC: 12.6 10*3/uL — ABNORMAL HIGH (ref 3.8–10.6)

## 2013-06-24 LAB — PROTIME-INR: Prothrombin Time: 19.4 secs — ABNORMAL HIGH (ref 11.5–14.7)

## 2013-06-24 LAB — COMPREHENSIVE METABOLIC PANEL
Alkaline Phosphatase: 79 U/L
Calcium, Total: 8.7 mg/dL (ref 8.5–10.1)
Co2: 27 mmol/L (ref 21–32)
Creatinine: 2.03 mg/dL — ABNORMAL HIGH (ref 0.60–1.30)
EGFR (Non-African Amer.): 31 — ABNORMAL LOW
Glucose: 256 mg/dL — ABNORMAL HIGH (ref 65–99)
Osmolality: 291 (ref 275–301)
Potassium: 4.2 mmol/L (ref 3.5–5.1)
SGOT(AST): 55 U/L — ABNORMAL HIGH (ref 15–37)
SGPT (ALT): 17 U/L (ref 12–78)

## 2013-06-24 LAB — TROPONIN I: Troponin-I: <0.02 ng/mL

## 2013-06-24 LAB — APTT: Activated PTT: 37.6 secs — ABNORMAL HIGH (ref 23.6–35.9)

## 2013-06-25 LAB — BASIC METABOLIC PANEL
Chloride: 105 mmol/L (ref 98–107)
Co2: 28 mmol/L (ref 21–32)
Creatinine: 1.71 mg/dL — ABNORMAL HIGH (ref 0.60–1.30)
EGFR (African American): 44 — ABNORMAL LOW
EGFR (Non-African Amer.): 38 — ABNORMAL LOW
Potassium: 4.3 mmol/L (ref 3.5–5.1)

## 2013-06-25 LAB — CBC WITH DIFFERENTIAL/PLATELET
Basophil #: 0 10*3/uL (ref 0.0–0.1)
Eosinophil #: 0 10*3/uL (ref 0.0–0.7)
Eosinophil %: 0 %
HCT: 26 % — ABNORMAL LOW (ref 40.0–52.0)
HGB: 9.3 g/dL — ABNORMAL LOW (ref 13.0–18.0)
Lymphocyte %: 1.8 %
MCH: 33.3 pg (ref 26.0–34.0)
MCHC: 35.7 g/dL (ref 32.0–36.0)
MCV: 93 fL (ref 80–100)
Monocyte %: 15.6 %
Neutrophil #: 7.2 10*3/uL — ABNORMAL HIGH (ref 1.4–6.5)
Platelet: 92 10*3/uL — ABNORMAL LOW (ref 150–440)
RBC: 2.79 10*6/uL — ABNORMAL LOW (ref 4.40–5.90)
RDW: 13.9 % (ref 11.5–14.5)
WBC: 8.7 10*3/uL (ref 3.8–10.6)

## 2013-06-26 DIAGNOSIS — I517 Cardiomegaly: Secondary | ICD-10-CM

## 2013-06-26 DIAGNOSIS — I4891 Unspecified atrial fibrillation: Secondary | ICD-10-CM

## 2013-06-26 DIAGNOSIS — I5033 Acute on chronic diastolic (congestive) heart failure: Secondary | ICD-10-CM

## 2013-06-26 LAB — BASIC METABOLIC PANEL
BUN: 46 mg/dL — ABNORMAL HIGH (ref 7–18)
Chloride: 104 mmol/L (ref 98–107)
Co2: 33 mmol/L — ABNORMAL HIGH (ref 21–32)
Creatinine: 1.8 mg/dL — ABNORMAL HIGH (ref 0.60–1.30)
Glucose: 175 mg/dL — ABNORMAL HIGH (ref 65–99)
Potassium: 3.7 mmol/L (ref 3.5–5.1)
Sodium: 141 mmol/L (ref 136–145)

## 2013-06-27 LAB — BASIC METABOLIC PANEL
BUN: 46 mg/dL — ABNORMAL HIGH (ref 7–18)
Chloride: 102 mmol/L (ref 98–107)
Creatinine: 1.8 mg/dL — ABNORMAL HIGH (ref 0.60–1.30)
EGFR (African American): 41 — ABNORMAL LOW
Osmolality: 296 (ref 275–301)

## 2013-06-27 LAB — TSH: Thyroid Stimulating Horm: 1.51 u[IU]/mL

## 2013-06-27 LAB — EXPECTORATED SPUTUM ASSESSMENT W GRAM STAIN, RFLX TO RESP C

## 2013-06-28 ENCOUNTER — Telehealth: Payer: Self-pay | Admitting: Internal Medicine

## 2013-06-28 LAB — BASIC METABOLIC PANEL
BUN: 46 mg/dL — ABNORMAL HIGH (ref 7–18)
Calcium, Total: 8.4 mg/dL — ABNORMAL LOW (ref 8.5–10.1)
Chloride: 101 mmol/L (ref 98–107)
Co2: 35 mmol/L — ABNORMAL HIGH (ref 21–32)
Creatinine: 2.14 mg/dL — ABNORMAL HIGH (ref 0.60–1.30)
EGFR (African American): 34 — ABNORMAL LOW
EGFR (Non-African Amer.): 29 — ABNORMAL LOW
Glucose: 143 mg/dL — ABNORMAL HIGH (ref 65–99)
Osmolality: 294 (ref 275–301)

## 2013-06-28 NOTE — Telephone Encounter (Signed)
Please let me know where you would like to put him on your schedule & I will call him & check on him and give him his appt, & request records.

## 2013-06-28 NOTE — Telephone Encounter (Signed)
Please put him in at 2:30 on 07/12/13.  Thanks.

## 2013-06-28 NOTE — Telephone Encounter (Signed)
[facility] is calling and wanting to set pt up for hospital f/u in 1 to 2 weeks but there are no slots ??? They ask if we could call pt with appt.

## 2013-06-28 NOTE — Telephone Encounter (Signed)
LMTCB

## 2013-06-29 ENCOUNTER — Telehealth: Payer: Self-pay | Admitting: *Deleted

## 2013-06-29 LAB — CULTURE, BLOOD (SINGLE)

## 2013-06-29 NOTE — Telephone Encounter (Signed)
He was just in the hospital and had labs.  Is in rehab now.  To my knowledge will not be coming if for lab.  If he shows up, let me know and will see what he needs (from hospital f/u).  Thanks.

## 2013-06-29 NOTE — Telephone Encounter (Signed)
Just notify them to let us know when he is discharged and we will then schedule a hospital f/u.  Thanks.

## 2013-06-29 NOTE — Telephone Encounter (Signed)
Pt is coming in for labs tomorrow 12.11.2014 what labs and dx?  

## 2013-06-29 NOTE — Telephone Encounter (Signed)
Called patient's home number & was informed that he is in Rehab at [facility]. He will not be able to come in next week for a hospital follow-up.

## 2013-06-30 ENCOUNTER — Other Ambulatory Visit: Payer: Medicare Other

## 2013-06-30 NOTE — Telephone Encounter (Signed)
Spoke with pt's wife, advising to call our office when pt discharged from rehab to schedule a hospital followup. Verbalized understanding.

## 2013-07-01 ENCOUNTER — Other Ambulatory Visit: Payer: Self-pay | Admitting: *Deleted

## 2013-07-01 ENCOUNTER — Other Ambulatory Visit: Payer: Self-pay | Admitting: Cardiovascular Disease

## 2013-07-01 MED ORDER — ATENOLOL 50 MG PO TABS: 100.0000 mg | ORAL_TABLET | Freq: Two times a day (BID) | ORAL | Status: DC

## 2013-07-01 NOTE — Telephone Encounter (Signed)
Requested Prescriptions   Signed Prescriptions Disp Refills  . atenolol (TENORMIN) 50 MG tablet 120 tablet 3    Sig: Take 2 tablets (100 mg total) by mouth 2 (two) times daily.    Authorizing Provider: Antonieta Iba    Ordering User: Kendrick Fries

## 2013-07-04 ENCOUNTER — Other Ambulatory Visit: Payer: Self-pay | Admitting: Internal Medicine

## 2013-07-12 ENCOUNTER — Telehealth: Payer: Self-pay | Admitting: Pulmonary Disease

## 2013-07-12 ENCOUNTER — Ambulatory Visit (INDEPENDENT_AMBULATORY_CARE_PROVIDER_SITE_OTHER): Payer: Medicare Other | Admitting: Internal Medicine

## 2013-07-12 ENCOUNTER — Encounter: Payer: Self-pay | Admitting: Internal Medicine

## 2013-07-12 VITALS — BP 140/60 | HR 57 | Temp 97.8°F | Ht 73.0 in | Wt 315.0 lb

## 2013-07-12 DIAGNOSIS — G4733 Obstructive sleep apnea (adult) (pediatric): Secondary | ICD-10-CM

## 2013-07-12 DIAGNOSIS — E1329 Other specified diabetes mellitus with other diabetic kidney complication: Secondary | ICD-10-CM

## 2013-07-12 DIAGNOSIS — I509 Heart failure, unspecified: Secondary | ICD-10-CM

## 2013-07-12 DIAGNOSIS — I4891 Unspecified atrial fibrillation: Secondary | ICD-10-CM

## 2013-07-12 DIAGNOSIS — I5030 Unspecified diastolic (congestive) heart failure: Secondary | ICD-10-CM

## 2013-07-12 DIAGNOSIS — N289 Disorder of kidney and ureter, unspecified: Secondary | ICD-10-CM

## 2013-07-12 DIAGNOSIS — R609 Edema, unspecified: Secondary | ICD-10-CM

## 2013-07-12 DIAGNOSIS — I10 Essential (primary) hypertension: Secondary | ICD-10-CM

## 2013-07-12 DIAGNOSIS — R600 Localized edema: Secondary | ICD-10-CM

## 2013-07-12 DIAGNOSIS — J449 Chronic obstructive pulmonary disease, unspecified: Secondary | ICD-10-CM

## 2013-07-12 DIAGNOSIS — E7800 Pure hypercholesterolemia, unspecified: Secondary | ICD-10-CM

## 2013-07-12 DIAGNOSIS — E0821 Diabetes mellitus due to underlying condition with diabetic nephropathy: Secondary | ICD-10-CM

## 2013-07-12 DIAGNOSIS — M25569 Pain in unspecified knee: Secondary | ICD-10-CM

## 2013-07-12 DIAGNOSIS — N058 Unspecified nephritic syndrome with other morphologic changes: Secondary | ICD-10-CM

## 2013-07-12 LAB — BASIC METABOLIC PANEL
BUN: 18 mg/dL (ref 6–23)
CO2: 30 mEq/L (ref 19–32)
Chloride: 105 mEq/L (ref 96–112)
Potassium: 4.5 mEq/L (ref 3.5–5.1)
Sodium: 142 mEq/L (ref 135–145)

## 2013-07-12 NOTE — Telephone Encounter (Signed)
called patient and lm x3. sent letter 12/23 °

## 2013-07-14 ENCOUNTER — Encounter: Payer: Self-pay | Admitting: Internal Medicine

## 2013-07-15 ENCOUNTER — Ambulatory Visit (INDEPENDENT_AMBULATORY_CARE_PROVIDER_SITE_OTHER): Payer: Medicare Other | Admitting: Adult Health

## 2013-07-15 ENCOUNTER — Encounter: Payer: Self-pay | Admitting: Adult Health

## 2013-07-15 ENCOUNTER — Encounter: Payer: Self-pay | Admitting: Internal Medicine

## 2013-07-15 VITALS — BP 152/62 | HR 63 | Temp 97.8°F | Resp 24 | Wt 315.0 lb

## 2013-07-15 DIAGNOSIS — M25569 Pain in unspecified knee: Secondary | ICD-10-CM | POA: Insufficient documentation

## 2013-07-15 DIAGNOSIS — J441 Chronic obstructive pulmonary disease with (acute) exacerbation: Secondary | ICD-10-CM

## 2013-07-15 MED ORDER — ALBUTEROL SULFATE (2.5 MG/3ML) 0.083% IN NEBU
2.5000 mg | INHALATION_SOLUTION | Freq: Once | RESPIRATORY_TRACT | Status: AC
  Administered 2013-07-15: 2.5 mg via RESPIRATORY_TRACT

## 2013-07-15 MED ORDER — AMOXICILLIN-POT CLAVULANATE 875-125 MG PO TABS: 1.0000 | ORAL_TABLET | Freq: Two times a day (BID) | ORAL | Status: DC

## 2013-07-15 NOTE — Addendum Note (Signed)
Addended by: Chandra Batch E on: 07/15/2013 03:33 PM   Modules accepted: Orders

## 2013-07-15 NOTE — Assessment & Plan Note (Signed)
Brought in no recorded sugar readings.  Discussed importance of diet.  Discussed importance of eating a snack before bed, eating regular meals and not going long periods without eating.  Problems with lows recently.  We discussed this at length today.  Discussed adjusting insulin if low sugars.  Check and record sugars.  Send in over the next 1-2 weeks.  Adjust medications as needed.

## 2013-07-15 NOTE — Assessment & Plan Note (Signed)
Nebulizer treatment in office for wheezing. Start Augmentin - patient on multiple medications that interact with antibiotics. Instructed to use rescue inhaler every 4 hours through the weekend. RTC on Monday for follow up visit.

## 2013-07-15 NOTE — Assessment & Plan Note (Signed)
S/p surgery.  Doing well.  Continues to follow up with Dr Gavin Potters.  Continue physical therapy.

## 2013-07-15 NOTE — Assessment & Plan Note (Signed)
Recently admitted with heart failure.  Diuresed.  Walking more.  Still with some sob with exertion.  ECHO as outlined.  Continue same medication regimen for now.  Keep follow up with Dr Mariah Milling next week.  Check metabolic panel today.

## 2013-07-15 NOTE — Progress Notes (Signed)
Pre visit review using our clinic review tool, if applicable. No additional management support is needed unless otherwise documented below in the visit note. 

## 2013-07-15 NOTE — Assessment & Plan Note (Signed)
No increased cough or congestion.  Follow.  Continue follow up with Dr Kendrick Fries.   Using the inhalers.

## 2013-07-15 NOTE — Patient Instructions (Signed)
  Start Augmentin - take 1 tablet twice a day for 10 days.  Please use your Xopenex inhaler every 4 hours through the weekend.  Return on Monday for follow up

## 2013-07-15 NOTE — Progress Notes (Signed)
Subjective:    Patient ID: [name], male    DOB: 09-28-1934, 78 y.o.   MRN: [id number]  HPI  Patient presents to clinic with coughing up green sputum that is also blood tinged, wheezing and shortness of breath. Reports the shortness of breath is ongoing secondary to hx of COPD. Has been using his symbicort but not his rescue inhaler. Reports being seen in clinic last week when he got home from rehab. Started to feel sick shortly afterward.  Current Outpatient Prescriptions on File Prior to Visit  Medication Sig Dispense Refill  . amiodarone (PACERONE) 200 MG tablet Take 200 mg by mouth 2 (two) times daily.      Marland Kitchen atenolol (TENORMIN) 50 MG tablet TAKE 1 TABLET (50 MG TOTAL) BY MOUTH 2 (TWO) TIMES DAILY.  60 tablet  6  . atorvastatin (LIPITOR) 20 MG tablet Take 20 mg by mouth daily.      . budesonide-formoterol (SYMBICORT) 80-4.5 MCG/ACT inhaler Inhale 2 puffs into the lungs 2 (two) times daily.  1 Inhaler  12  . cloNIDine (CATAPRES) 0.1 MG tablet Take 1 tablet (0.1 mg total) by mouth 2 (two) times daily.  180 tablet  3  . escitalopram (LEXAPRO) 10 MG tablet TAKE 1/2 TABLET BY MOUTH EVERY OTHER DAY      . esomeprazole (NEXIUM) 40 MG capsule Take 40 mg by mouth daily before breakfast.      . glucose blood test strip Use three times a day as instructed  150 each  5  . glyBURIDE (DIABETA) 5 MG tablet Take 1 tablet (5 mg total) by mouth daily with breakfast.  30 tablet  5  . HUMALOG 100 UNIT/ML injection INJECT 24 UNITS AT BREAKFAST, 22 UNITS AT LUNCH & 14 UNITS AT SUPPER  30 mL  3  . hyoscyamine (LEVBID) 0.375 MG 12 hr tablet 1/2 TABLET BY MOUTH TWICE A DAY FOR 1 WEEK, THEN INCREASE TO 1 TABLET TWICE A DAY  60 tablet  1  . insulin glargine (LANTUS) 100 UNIT/ML injection TAKE 26 UNITS IN THE AM AND 35 UNITS IN THE PM.      . levalbuterol (XOPENEX HFA) 45 MCG/ACT inhaler Inhale 2 puffs into the lungs every 4 (four) hours as needed for wheezing.  1 Inhaler  5  . LYRICA 75 MG capsule TAKE 1  CAPSULE TWICE DAILY  60 capsule  5  . Multiple Vitamin (MULTIVITAMIN) tablet Take 1 tablet by mouth daily.      . potassium chloride SA (K-DUR,KLOR-CON) 20 MEQ tablet Takes 1/2 tablet am and 1/2 tablet pm daily.      . sitaGLIPtin (JANUVIA) 100 MG tablet TAKE 1 TABLET BY MOUTH ONCE A DAY  30 tablet  5  . Tamsulosin HCl (FLOMAX) 0.4 MG CAPS Take 0.4 mg by mouth daily.       . Tapentadol HCl (NUCYNTA PO) Take by mouth 2 (two) times daily as needed (pain).      . torsemide (DEMADEX) 20 MG tablet 2 (two) times daily. Takes 40 mg am and 20 mg pm daily.       No current facility-administered medications on file prior to visit.       Review of Systems  Constitutional: Negative for fever and chills.  HENT: Positive for congestion.   Respiratory: Positive for cough, shortness of breath and wheezing.   Cardiovascular: Positive for leg swelling.       Objective:   Physical Exam  Constitutional: He is oriented to person, place, and  time. No distress.  Cardiovascular: Normal rate and regular rhythm.   Pulmonary/Chest: No respiratory distress. He has wheezes. He has no rales.  Lungs with good air movement posteriorly. Exp wheezing anteriorly, bilaterally.  Neurological: He is alert and oriented to person, place, and time.  Skin: Skin is warm and dry.  Psychiatric: He has a normal mood and affect. His behavior is normal. Judgment and thought content normal.          Assessment & Plan:

## 2013-07-15 NOTE — Progress Notes (Signed)
Subjective:    Patient ID: [name], male    DOB: 09-06-1934, 78 y.o.   MRN: [id number]  HPI 78 year old male with past history of hypertension, hypercholesterolemia, diabetes with renal insufficiency (previously followed by Dr Hyacinth Meeker) and thrombocytopenia who comes in today as a work in for a hospital follow up.  He was hospitalized from 06/24/13 - 06/28/13 with acute respiratory failure secondary to congestive heart failure.  He presented with worsening lower extremity edema and significant weight gain.  He was started on IV Lasix and responded well.  Was changed to torsemide prior to discharge.  Had ECHO that revealed EF 55-60%.  Has known atrial fibrillation.  Followed by Dr Mariah Milling.  Was noted to be in SR in the hospital.  Per notes, was started back on Xarelto.  Planning to f/u with Dr Mariah Milling next week.  Also has known COPD and is followed by Dr Kendrick Fries.  He recently had noticed increased issues with dysphagia.  Was evaluated by GI.  Had an UGI and EGD.  States everything checked out fine. No significant acid reflux.  Swallowing is better on omeprazole.   Denies any chest pain.  No increased heart racing or palpitations.  No increased cough or congestion.  Does have problems with lower extremity swelling.  Better in the am and worse as the day progresses.  Overall improved.  Blood sugars varying.  Brought in no recorded readings.  He has had issues with not eating regular meals.  Gets up late.   Will go long periods without eating.  Has some issues with low blood sugars.  we discussed this at length.  We discussed adjusting his insulin.  Just had knee surgery.  Continues physical therapy.  Is walking more.  Does still get winded with increased exertion.        Past Medical History  Diagnosis Date  . Arthritis   . Sleep apnea   . COPD (chronic obstructive pulmonary disease)   . Hypercholesterolemia   . Hypertension   . Thrombocytopenia   . Renal insufficiency   . Diverticulosis   . History  of colon polyps   . 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 amiodarone (PACERONE) 200 MG tablet Take 200 mg by mouth 2 (two) times daily.      Marland Kitchen atenolol (TENORMIN) 50 MG tablet TAKE 1 TABLET (50 MG TOTAL) BY MOUTH 2 (TWO) TIMES DAILY.  60 tablet  6  . atorvastatin (LIPITOR) 20 MG tablet Take 20 mg by mouth daily.      . budesonide-formoterol (SYMBICORT) 80-4.5 MCG/ACT inhaler Inhale 2 puffs into the lungs 2 (two) times daily.  1 Inhaler  12  . cloNIDine (CATAPRES) 0.1 MG tablet Take 1 tablet (0.1 mg total) by mouth 2 (two) times daily.  180 tablet  3  . escitalopram (LEXAPRO) 10 MG tablet TAKE 1/2 TABLET BY MOUTH EVERY OTHER DAY      . esomeprazole (NEXIUM) 40 MG capsule Take 40 mg by mouth daily before breakfast.      . glucose blood test strip Use three times a day as instructed  150 each  5  . glyBURIDE (DIABETA) 5 MG tablet Take 1 tablet (5 mg total) by mouth daily with breakfast.  30 tablet  5  . HUMALOG 100 UNIT/ML injection INJECT 24 UNITS AT BREAKFAST, 22 UNITS  AT LUNCH & 14 UNITS AT SUPPER  30 mL  3  . hyoscyamine (LEVBID) 0.375 MG 12 hr tablet 1/2 TABLET BY MOUTH TWICE A DAY FOR 1 WEEK, THEN INCREASE TO 1 TABLET TWICE A DAY  60 tablet  1  . insulin glargine (LANTUS) 100 UNIT/ML injection TAKE 26 UNITS IN THE AM AND 35 UNITS IN THE PM.      . levalbuterol (XOPENEX HFA) 45 MCG/ACT inhaler Inhale 2 puffs into the lungs every 4 (four) hours as needed for wheezing.  1 Inhaler  5  . LYRICA 75 MG capsule TAKE 1 CAPSULE TWICE DAILY  60 capsule  5  . Multiple Vitamin (MULTIVITAMIN) tablet Take 1 tablet by mouth daily.      . potassium chloride SA (K-DUR,KLOR-CON) 20 MEQ tablet Takes 1/2 tablet am and 1/2 tablet pm daily.      . sitaGLIPtin (JANUVIA) 100 MG tablet TAKE 1 TABLET BY MOUTH ONCE A DAY  30 tablet  5  . Tamsulosin HCl (FLOMAX) 0.4 MG CAPS Take 0.4 mg by  mouth daily.       . Tapentadol HCl (NUCYNTA PO) Take by mouth 2 (two) times daily as needed (pain).      . torsemide (DEMADEX) 20 MG tablet 2 (two) times daily. Takes 40 mg am and 20 mg pm daily.       No current facility-administered medications on file prior to visit.    Review of Systems Patient denies any headache, lightheadedness or dizziness.  No sinus or allergy symptoms.  No chest pain or palpitations.  No increased heart rate.  No cough or congestion.  Breathing relatively stable.  Still with some increased sob with exertion.  Has been using his inhalers on a regular basis now.   No nausea or vomiting.  No abdominal pain or cramping.   No urine change.   Still seeing Dr Franky Macho for his back.  Brought in no sugar readings.   Varying.  Some lows.  Discussed the importance of eating regular meals and not going long periods without eating.  Discussed how to adjust his insulin.  Needs to get me some sugar readings.  Is s/p knee surgery.  He is walking better.  Doing physical therapy now.  Has had some issues with constipation.  We discussed using miralax.  Had a good bowel movement yesterday.        Objective:   Physical Exam  Filed Vitals:   07/12/13 1438  BP: 140/60  Pulse: 57  Temp: 97.8 F (36.6 C)   Blood pressure recheck:  64/92  78 year old male in no acute distress.  HEENT:  Nares - clear.  Oropharynx - without lesions. NECK:  Supple.  Nontender.  No audible carotid bruit.  HEART:  Appears to be regular.   Rate controlled.    LUNGS:  No crackles or wheezing audible.  Respirations even and unlabored.   RADIAL PULSE:  Equal bilaterally.  ABDOMEN:  Nontender.  Bowel sounds present and normal.  No audible abdominal bruit.    EXTREMITIES:  Pitting edema - stable.   No increased erythema.   SKIN:  No lesions.    FEET:  No lesions.      Assessment & Plan:  MSK.  S/p knee surgery.  Going to physical therapy.  Walking better.  Continues to f/u with Dr Gavin Potters.       HEALTH  MAINTENANCE.  Prostate and PSAs through Dr Achilles Dunk.  Colonoscopy 10/21/11 - diverticulosis and several polyps.  Due follow up colonoscopy in 2016.    I spent more than 25 minutes with this patient and more than 50% of the time was spent in consultation regarding the above.

## 2013-07-15 NOTE — Assessment & Plan Note (Signed)
Off cholesterol medication.   Cholesterol last checked and LDL and triglycerides elevated.  Have discussed with him regarding the need for treatment.  Have discussed starting Lipitor.  He declined.  Low cholesterol diet and exercise.  Follow.  

## 2013-07-15 NOTE — Assessment & Plan Note (Signed)
Per records, Cr 2.1 at discharge.  Increased from last check here.  Has an appt already scheduled with nephrology.  Recheck metabolic panel today.  Avoid nephrotoxic agents.

## 2013-07-15 NOTE — Assessment & Plan Note (Signed)
Seeing Dr Mariah Milling.  Per hospitalization, back on xarelto.  Appears to be in SR.  Due to see Dr Mariah Milling next week.

## 2013-07-15 NOTE — Assessment & Plan Note (Signed)
Discussed wearing support hose.  Follow.

## 2013-07-15 NOTE — Assessment & Plan Note (Signed)
Using CPAP.  Follow.  

## 2013-07-15 NOTE — Assessment & Plan Note (Signed)
Blood pressure has been under reasonable control.  Some variation in the hospital.  Unsure what medications he is currently taking.  He is going to get me a list of exactly what he is taking.  Blood pressure looks good today. Follow.  Check metabolic panel.  Clarify medications.

## 2013-07-18 ENCOUNTER — Ambulatory Visit (INDEPENDENT_AMBULATORY_CARE_PROVIDER_SITE_OTHER): Payer: Medicare Other | Admitting: Adult Health

## 2013-07-18 ENCOUNTER — Encounter: Payer: Self-pay | Admitting: Adult Health

## 2013-07-18 ENCOUNTER — Ambulatory Visit: Payer: Medicare Other | Admitting: Cardiovascular Disease

## 2013-07-18 VITALS — BP 156/70 | HR 64 | Temp 97.8°F | Resp 12 | Wt 319.5 lb

## 2013-07-18 DIAGNOSIS — J441 Chronic obstructive pulmonary disease with (acute) exacerbation: Secondary | ICD-10-CM

## 2013-07-18 NOTE — Assessment & Plan Note (Signed)
Symptoms improved. Continue antibiotic. Rescue inhaler when necessary. Continue Symbicort as directed. RTC if symptoms worsen or if no improvement within 4-5 days.

## 2013-07-18 NOTE — Progress Notes (Signed)
Subjective:    Patient ID: [name], male    DOB: 1935/06/02, 78 y.o.   MRN: [id number]  HPI  Patient is a pleasant 78 year old male who presents to clinic for followup COPD exacerbation. Patient was seen in clinic on Friday, December 26 with significant shortness of breath and wheezing. He was started on Augmentin. He was asked to use his rescue inhaler throughout the weekend and to continue his Symbicort twice a day as ordered. He presents to clinic reporting that he is feeling much better. Denies wheezing. He normally is short of breath given his history of COPD but his shortness of breath has improved. He also reports being able to sleep for the first time last night since he became acutely ill.  Current Outpatient Prescriptions on File Prior to Visit  Medication Sig Dispense Refill  . amiodarone (PACERONE) 200 MG tablet Take 200 mg by mouth 2 (two) times daily.      Marland Kitchen amoxicillin-clavulanate (AUGMENTIN) 875-125 MG per tablet Take 1 tablet by mouth 2 (two) times daily.  20 tablet  0  . atenolol (TENORMIN) 50 MG tablet TAKE 1 TABLET (50 MG TOTAL) BY MOUTH 2 (TWO) TIMES DAILY.  60 tablet  6  . atorvastatin (LIPITOR) 20 MG tablet Take 20 mg by mouth daily.      . budesonide-formoterol (SYMBICORT) 80-4.5 MCG/ACT inhaler Inhale 2 puffs into the lungs 2 (two) times daily.  1 Inhaler  12  . cloNIDine (CATAPRES) 0.1 MG tablet Take 1 tablet (0.1 mg total) by mouth 2 (two) times daily.  180 tablet  3  . escitalopram (LEXAPRO) 10 MG tablet TAKE 1/2 TABLET BY MOUTH EVERY OTHER DAY      . esomeprazole (NEXIUM) 40 MG capsule Take 40 mg by mouth daily before breakfast.      . glucose blood test strip Use three times a day as instructed  150 each  5  . glyBURIDE (DIABETA) 5 MG tablet Take 1 tablet (5 mg total) by mouth daily with breakfast.  30 tablet  5  . HUMALOG 100 UNIT/ML injection INJECT 24 UNITS AT BREAKFAST, 22 UNITS AT LUNCH & 14 UNITS AT SUPPER  30 mL  3  . hyoscyamine (LEVBID) 0.375 MG 12  hr tablet 1/2 TABLET BY MOUTH TWICE A DAY FOR 1 WEEK, THEN INCREASE TO 1 TABLET TWICE A DAY  60 tablet  1  . insulin glargine (LANTUS) 100 UNIT/ML injection TAKE 26 UNITS IN THE AM AND 35 UNITS IN THE PM.      . levalbuterol (XOPENEX HFA) 45 MCG/ACT inhaler Inhale 2 puffs into the lungs every 4 (four) hours as needed for wheezing.  1 Inhaler  5  . LYRICA 75 MG capsule TAKE 1 CAPSULE TWICE DAILY  60 capsule  5  . Multiple Vitamin (MULTIVITAMIN) tablet Take 1 tablet by mouth daily.      . potassium chloride SA (K-DUR,KLOR-CON) 20 MEQ tablet Takes 1/2 tablet am and 1/2 tablet pm daily.      . sitaGLIPtin (JANUVIA) 100 MG tablet TAKE 1 TABLET BY MOUTH ONCE A DAY  30 tablet  5  . Tamsulosin HCl (FLOMAX) 0.4 MG CAPS Take 0.4 mg by mouth daily.       . Tapentadol HCl (NUCYNTA PO) Take by mouth 2 (two) times daily as needed (pain).      . torsemide (DEMADEX) 20 MG tablet 2 (two) times daily. Takes 40 mg am and 20 mg pm daily.       No  current facility-administered medications on file prior to visit.     Review of Systems  Respiratory: Positive for cough and shortness of breath. Negative for chest tightness and wheezing.   Cardiovascular: Positive for leg swelling. Negative for chest pain and palpitations.       Objective:   Physical Exam  Constitutional: He is oriented to person, place, and time. No distress.  Cardiovascular: Normal rate and regular rhythm.   Pulmonary/Chest: Effort normal and breath sounds normal. No respiratory distress. He has no wheezes. He has no rales.  Shortness of breath with exertion.  Neurological: He is alert and oriented to person, place, and time.  Skin: Skin is warm and dry.  Psychiatric: He has a normal mood and affect. His behavior is normal. Judgment and thought content normal.          Assessment & Plan:

## 2013-07-18 NOTE — Patient Instructions (Signed)
  Continue the Augmentin twice daily - 1 tablet twice a day until complete.  Xopenex inhaler if you become short of breath or wheezing.  Use Symbicort twice daily as directed.

## 2013-07-18 NOTE — Progress Notes (Signed)
Pre visit review using our clinic review tool, if applicable. No additional management support is needed unless otherwise documented below in the visit note. 

## 2013-07-22 ENCOUNTER — Telehealth: Payer: Self-pay

## 2013-07-22 NOTE — Telephone Encounter (Signed)
Pt called stating that is was doing exercises in preparation for upcoming knee surgery when he noticed that his left arm and hand are swollen, "like a bee sting". Denies any other symptoms and is wondering if this is r/t his heart. Advised pt to call PCP and see if they can see him this afternoon and if they cannot work him in, to call back to our office. Pt is agreeable to this and will call Dr. Roby LoftsScott's office as soon as he hangs up the phone with me.

## 2013-07-25 ENCOUNTER — Ambulatory Visit: Payer: Medicare Other | Admitting: Cardiovascular Disease

## 2013-07-26 ENCOUNTER — Encounter: Payer: Self-pay | Admitting: Cardiovascular Disease

## 2013-07-26 ENCOUNTER — Ambulatory Visit (INDEPENDENT_AMBULATORY_CARE_PROVIDER_SITE_OTHER): Payer: Medicare Other | Admitting: Cardiovascular Disease

## 2013-07-26 VITALS — BP 140/68 | HR 60 | Ht 73.0 in | Wt 312.2 lb

## 2013-07-26 DIAGNOSIS — I10 Essential (primary) hypertension: Secondary | ICD-10-CM

## 2013-07-26 DIAGNOSIS — J441 Chronic obstructive pulmonary disease with (acute) exacerbation: Secondary | ICD-10-CM

## 2013-07-26 DIAGNOSIS — I5032 Chronic diastolic (congestive) heart failure: Secondary | ICD-10-CM

## 2013-07-26 DIAGNOSIS — I4891 Unspecified atrial fibrillation: Secondary | ICD-10-CM

## 2013-07-26 DIAGNOSIS — R600 Localized edema: Secondary | ICD-10-CM

## 2013-07-26 DIAGNOSIS — R609 Edema, unspecified: Secondary | ICD-10-CM

## 2013-07-26 DIAGNOSIS — N289 Disorder of kidney and ureter, unspecified: Secondary | ICD-10-CM

## 2013-07-26 MED ORDER — AMIODARONE HCL 200 MG PO TABS: 200.0000 mg | ORAL_TABLET | Freq: Every day | ORAL | Status: DC

## 2013-07-26 NOTE — Assessment & Plan Note (Signed)
Chronic shortness of breath, likely multifactorial including COPD, morbid obesity, underlying heart disease and diastolic CHF

## 2013-07-26 NOTE — Patient Instructions (Addendum)
You are doing well. Please decrease the amiodarone down to 200 mg once a day  If you have worsening shortness of breath or worsening leg swelling, You can take an extra torsemide And Call the office  Please call us if you have new issues that need to be addressed before your next appt.  Your physician wants you to follow-up in: 6 months.  You will receive a reminder letter in the mail two months in advance. If you don't receive a letter, please call our office to schedule the follow-up appointment.

## 2013-07-26 NOTE — Assessment & Plan Note (Signed)
Hesitant to push diuretics further given prior history of renal failure. We'll continue current medications

## 2013-07-26 NOTE — Assessment & Plan Note (Signed)
I'm hesitant to push his diuretics more than torsemide 40 mg in the morning and 20 mg in the afternoon. Weight has been stable. Pitting edema likely from chronic venous insufficiency. Creatinine running high. Weight is a major issue causing his shortness of breath with exertion. Suggested he take an extra dose of diuretic for worsening edema or shortness of breath or any weight gain. Again he reports weight at home has been the same as when he left the hospital one month ago.

## 2013-07-26 NOTE — Progress Notes (Signed)
Patient ID: [name], male    DOB: [birth year], 79 y.o.   MRN: [id number]  HPI Comments: 79 year old male with past history of morbid obesity, hypertension, hypercholesterolemia, diabetes with renal insufficiency, thrombocytopenia patient of, Dr. Lorin Picket,  episode of atrial fibrillation in early 2014.  history of COPD, obstructive sleep apnea, on CPAP.  smoked for 25 years, stopped 30 years ago. On his initial clinic visit, he was started on rate control medications for atrial fibrillation  and diuretic for significant lower extremity edema and shortness of breath. Also started on anticoagulation.  elective cardioversion on October 27 2012 which was successful.  Notes indicate he takes torsemide 40 mg in the morning, 20 mg in the p.m. He is unsure of his medications Reports his weight has been the same since discharge from the hospital last month for heart failure. We consulted on him in the hospital 06/26/2013 for shortness of breath BNP 4500. He had aggressive diuresis and was -11 L through his hospital course, ejection fraction 55-60%, creatinine 2.1 but went as high as 2.4, hemoglobin A1c 7.2. He was in normal sinus rhythm during his hospital course Continues to have significant leg edema worse on the right than the left. This has been a chronic issue. Sits with his legs down most of the day. Weight continues to be a major problem. Continues to have moderate shortness of breath at baseline. He blames the leg swelling on Lyrica Most recent creatinine 1.5. Total cholesterol 167, LDL 101, HDL 42   History of  severe neck pain. He reports having an episode of neck pain previously that seemed to resolve after an MRI. Then he developed hip pain and was seeking care from an orthopedic specialist.  He reports being compliant with his CPAP  Echocardiogram  shows normal ejection fraction with moderately dilated left atrium, high normal right ventricular systolic pressure   baseline  creatinine 1.5 to 1.8    EKG today shows normal sinus rhythm with rate 60 beats per minute, right bundle branch block, no other significant ST or T wave changes   Past Medical History . Arthritis  . 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 Sleep apnea  . COPD (chronic obstructive pulmonary disease)  . Hypercholesterolemia  . Hypertension  . Thrombocytopenia  . Renal insufficiency  . Diverticulosis  . History of colon polyps     Outpatient Encounter Prescriptions as of 07/26/2013  Medication Sig  . amiodarone (PACERONE) 200 MG tablet Take 1 tablet (200 mg total) by mouth daily.  Marland Kitchen amoxicillin-clavulanate (AUGMENTIN) 875-125 MG per tablet Take 1 tablet by mouth 2 (two) times daily.  Marland Kitchen atenolol (TENORMIN) 50 MG tablet TAKE 1 TABLET (50 MG TOTAL) BY MOUTH 2 (TWO) TIMES DAILY.  Marland Kitchen atorvastatin (LIPITOR) 20 MG tablet Take 20 mg by mouth daily.  . budesonide-formoterol (SYMBICORT) 80-4.5 MCG/ACT inhaler Inhale 2 puffs into the lungs 2 (two) times daily.  . cloNIDine (CATAPRES) 0.1 MG tablet Take 1 tablet (0.1 mg total) by mouth 2 (two) times daily.  Marland Kitchen escitalopram (LEXAPRO) 10 MG tablet TAKE 1/2 TABLET BY mouth daily  . esomeprazole (NEXIUM) 40 MG capsule Take 40 mg by mouth daily before breakfast.  . glucose blood test strip Use three times a day as instructed  . glyBURIDE (DIABETA) 5 MG tablet Take 1 tablet (5 mg total) by mouth daily with breakfast.  . HUMALOG 100 UNIT/ML injection INJECT 24 UNITS AT BREAKFAST, 22 UNITS AT LUNCH & 14 UNITS  AT SUPPER  . hyoscyamine (LEVBID) 0.375 MG 12 hr tablet 1/2 TABLET BY MOUTH TWICE A DAY FOR 1 WEEK, THEN INCREASE TO 1 TABLET TWICE A DAY  . insulin glargine (LANTUS) 100 UNIT/ML injection TAKE 26 UNITS IN THE AM AND 35 UNITS IN THE PM.  . levalbuterol (XOPENEX HFA) 45 MCG/ACT inhaler Inhale 2 puffs into the lungs every 4 (four) hours as needed for wheezing.  Marland Kitchen. LYRICA 75 MG capsule TAKE 1 CAPSULE TWICE DAILY  . Multiple Vitamin (MULTIVITAMIN) tablet Take  1 tablet by mouth daily.  . potassium chloride SA (K-DUR,KLOR-CON) 20 MEQ tablet Takes 1/2 tablet am and 1/2 tablet pm daily.  . sitaGLIPtin (JANUVIA) 100 MG tablet TAKE 1 TABLET BY MOUTH ONCE A DAY  . Tamsulosin HCl (FLOMAX) 0.4 MG CAPS Take 0.4 mg by mouth daily.   Marland Kitchen. torsemide (DEMADEX) 20 MG tablet 2 (two) times daily. Takes 40 mg am and 20 mg pm daily.  . [DISCONTINUED] amiodarone (PACERONE) 200 MG tablet Take 400 mg by mouth 2 (two) times daily.   . [DISCONTINUED] Tapentadol HCl (NUCYNTA PO) Take by mouth 2 (two) times daily as needed (pain).    Review of Systems  Constitutional: Negative.   HENT: Negative.   Eyes: Negative.   Respiratory: Negative.   Cardiovascular: Positive for leg swelling.  Gastrointestinal: Negative.   Endocrine: Negative.   Musculoskeletal: Negative.   Skin: Negative.   Allergic/Immunologic: Negative.   Neurological: Negative.   Hematological: Negative.   Psychiatric/Behavioral: Negative.   All other systems reviewed and are negative.    BP 140/68  Pulse 60  Ht 6\' 1"  (1.854 m)  Wt 312 lb 4 oz (141.636 kg)  BMI 41.21 kg/m2  Physical Exam  Nursing note and vitals reviewed. Constitutional: He is oriented to person, place, and time. He appears well-developed and well-nourished.  Morbidly obese  HENT:  Head: Normocephalic.  Nose: Nose normal.  Mouth/Throat: Oropharynx is clear and moist.  Eyes: Conjunctivae are normal. Pupils are equal, round, and reactive to light.  Neck: Normal range of motion. Neck supple. No JVD present.  Cardiovascular: S1 normal, S2 normal, normal heart sounds and intact distal pulses.  An irregularly irregular rhythm present. Tachycardia present.  Exam reveals no gallop and no friction rub.   No murmur heard. 2+ pitting edema to below the knees  Pulmonary/Chest: Effort normal. No respiratory distress. He has no wheezes. He exhibits no tenderness.  Abdominal: Soft. Bowel sounds are normal. He exhibits no distension. There is  no tenderness.  Musculoskeletal: Normal range of motion. He exhibits no edema and no tenderness.  Lymphadenopathy:    He has no cervical adenopathy.  Neurological: He is alert and oriented to person, place, and time. Coordination normal.  Skin: Skin is warm and dry. No rash noted. No erythema.  Psychiatric: He has a normal mood and affect. His behavior is normal. Judgment and thought content normal.      Assessment and Plan

## 2013-07-26 NOTE — Assessment & Plan Note (Signed)
Blood pressure is well controlled on today's visit. No changes made to the medications. 

## 2013-07-26 NOTE — Assessment & Plan Note (Signed)
Appears to be maintaining normal sinus rhythm on his current medication regimen. No changes made. He is high risk of converting back to arrhythmia given his chronic shortness of breath, fluid shifts

## 2013-07-26 NOTE — Assessment & Plan Note (Signed)
Likely significant component of venous insufficiency given he is prerenal and has significant pitting edema. Recommended compression hose and leg elevation. He does not do either

## 2013-07-29 ENCOUNTER — Telehealth: Payer: Self-pay | Admitting: Internal Medicine

## 2013-07-29 NOTE — Telephone Encounter (Signed)
Spoke with Lia FoyerNicole ([facility]) & verbal given

## 2013-07-29 NOTE — Telephone Encounter (Signed)
ok 

## 2013-07-29 NOTE — Telephone Encounter (Signed)
Junious Dresseronnie with [facility] calling to get a verbal to see [name] for OT 3 times a week for 3 weeks. She would also like to include the breather exercise to visits as well. Please call Junious DresserConnie @ [phone]

## 2013-07-29 NOTE — Telephone Encounter (Signed)
Verbal consent given.

## 2013-07-29 NOTE — Telephone Encounter (Signed)
Can they extend nursing to 2/19.

## 2013-07-30 ENCOUNTER — Other Ambulatory Visit: Payer: Self-pay | Admitting: Internal Medicine

## 2013-08-01 ENCOUNTER — Encounter: Payer: Self-pay | Admitting: *Deleted

## 2013-08-01 NOTE — Telephone Encounter (Signed)
Refill

## 2013-08-01 NOTE — Telephone Encounter (Signed)
He is seen by GI.  I think they are the ones that initially prescribed this medication.  He needs to get refills there.  Let me know if any problems.  Thanks.

## 2013-08-17 ENCOUNTER — Telehealth: Payer: Self-pay | Admitting: Internal Medicine

## 2013-08-17 NOTE — Telephone Encounter (Signed)
Relevant patient education assigned to patient using Emmi. ° °

## 2013-08-21 ENCOUNTER — Other Ambulatory Visit: Payer: Self-pay | Admitting: Internal Medicine

## 2013-08-22 NOTE — Telephone Encounter (Signed)
Ok refill? 

## 2013-08-22 NOTE — Telephone Encounter (Signed)
See 07/30/13 refill message.  Pt sees GI and they are the ones that started him on this medication.  He needs to contact them.  Please let him and/or pharmacy know.  Thanks.

## 2013-08-23 ENCOUNTER — Ambulatory Visit (INDEPENDENT_AMBULATORY_CARE_PROVIDER_SITE_OTHER): Payer: Medicare Other | Admitting: Internal Medicine

## 2013-08-23 ENCOUNTER — Telehealth: Payer: Self-pay | Admitting: Internal Medicine

## 2013-08-23 ENCOUNTER — Encounter: Payer: Self-pay | Admitting: Internal Medicine

## 2013-08-23 VITALS — BP 130/80 | HR 55 | Temp 97.7°F | Ht 73.0 in | Wt 307.0 lb

## 2013-08-23 DIAGNOSIS — E0821 Diabetes mellitus due to underlying condition with diabetic nephropathy: Secondary | ICD-10-CM

## 2013-08-23 DIAGNOSIS — J4489 Other specified chronic obstructive pulmonary disease: Secondary | ICD-10-CM

## 2013-08-23 DIAGNOSIS — R609 Edema, unspecified: Secondary | ICD-10-CM

## 2013-08-23 DIAGNOSIS — N058 Unspecified nephritic syndrome with other morphologic changes: Secondary | ICD-10-CM

## 2013-08-23 DIAGNOSIS — J449 Chronic obstructive pulmonary disease, unspecified: Secondary | ICD-10-CM

## 2013-08-23 DIAGNOSIS — G4733 Obstructive sleep apnea (adult) (pediatric): Secondary | ICD-10-CM

## 2013-08-23 DIAGNOSIS — E1329 Other specified diabetes mellitus with other diabetic kidney complication: Secondary | ICD-10-CM

## 2013-08-23 DIAGNOSIS — D696 Thrombocytopenia, unspecified: Secondary | ICD-10-CM

## 2013-08-23 DIAGNOSIS — I10 Essential (primary) hypertension: Secondary | ICD-10-CM

## 2013-08-23 DIAGNOSIS — R600 Localized edema: Secondary | ICD-10-CM

## 2013-08-23 DIAGNOSIS — I4891 Unspecified atrial fibrillation: Secondary | ICD-10-CM

## 2013-08-23 DIAGNOSIS — N289 Disorder of kidney and ureter, unspecified: Secondary | ICD-10-CM

## 2013-08-23 DIAGNOSIS — M25569 Pain in unspecified knee: Secondary | ICD-10-CM

## 2013-08-23 DIAGNOSIS — E7800 Pure hypercholesterolemia, unspecified: Secondary | ICD-10-CM

## 2013-08-23 NOTE — Progress Notes (Signed)
Pre-visit discussion using our clinic review tool. No additional management support is needed unless otherwise documented below in the visit note.  

## 2013-08-23 NOTE — Telephone Encounter (Signed)
I notified pt that he should have 2 refills remaining at the pharmacy (filled on 04/29/13 with 5 refills)

## 2013-08-23 NOTE — Telephone Encounter (Signed)
LYRICA 75 MG capsule

## 2013-08-24 ENCOUNTER — Other Ambulatory Visit: Payer: Self-pay | Admitting: *Deleted

## 2013-08-28 ENCOUNTER — Other Ambulatory Visit: Payer: Self-pay | Admitting: Internal Medicine

## 2013-08-28 ENCOUNTER — Encounter: Payer: Self-pay | Admitting: Internal Medicine

## 2013-08-28 NOTE — Assessment & Plan Note (Signed)
Seeing Dr Gollan.  Per hospitalization, back on xarelto.  Appears to be in SR.    

## 2013-08-28 NOTE — Assessment & Plan Note (Signed)
S/p surgery.  Doing well.  Continues to follow up with Dr [facility].  Continue home exercises.

## 2013-08-28 NOTE — Assessment & Plan Note (Signed)
Have discussed importance of diet.  Discussed importance of eating a snack before bed, eating regular meals and not going long periods without eating.  Problems with lows recently.  We discussed this at length today.  Discussed adjusting insulin if low sugars.  Stop glyburide.

## 2013-08-28 NOTE — Assessment & Plan Note (Signed)
Blood pressure has been under reasonable control.  Follow pressures.  Follow metabolic panel.   

## 2013-08-28 NOTE — Assessment & Plan Note (Signed)
Follow cbc.  

## 2013-08-28 NOTE — Assessment & Plan Note (Signed)
Using CPAP.  Follow.  

## 2013-08-28 NOTE — Assessment & Plan Note (Signed)
Off cholesterol medication.   Cholesterol last checked and LDL and triglycerides elevated.  Have discussed with him regarding the need for treatment.  Have discussed starting Lipitor.  He declined.  Low cholesterol diet and exercise.  Follow.

## 2013-08-28 NOTE — Assessment & Plan Note (Signed)
Seeing nephrology.  Follow metabolic panel.  Avoid nephrotoxic agents.   

## 2013-08-28 NOTE — Assessment & Plan Note (Signed)
Symptoms improved.  Rescue inhaler when necessary. Continue Symbicort as directed.  Breathing improved.

## 2013-08-28 NOTE — Progress Notes (Signed)
Subjective:    Patient ID: [name], male    DOB: 1934-09-27, 79 y.o.   MRN: [id number]  HPI 79 year old male with past history of hypertension, hypercholesterolemia, diabetes with renal insufficiency (previously followed by Dr Hyacinth MeekerMiller) and thrombocytopenia who comes in today for a scheduled follow up.  He was hospitalized from 06/24/13 - 06/28/13 with acute respiratory failure secondary to congestive heart failure.  He presented with worsening lower extremity edema and significant weight gain.  He was started on IV Lasix and responded well.  Was changed to torsemide prior to discharge.  Had ECHO that revealed EF 55-60%.  Has known atrial fibrillation.  Followed by Dr Mariah MillingGollan.  Was noted to be in SR in the hospital.  Per notes, was started back on Xarelto.   Also has known COPD and is followed by Dr Kendrick FriesMcQuaid.  He recently had noticed increased issues with dysphagia.  Was evaluated by GI.  Had an UGI and EGD.  States everything checked out fine. No significant acid reflux.  Swallowing is better on omeprazole.   Denies any chest pain.  No increased heart racing or palpitations.  No increased cough or congestion.  Does have problems with lower extremity swelling.  Better in the am and worse as the day progresses.  Overall improved.  Breathing has improved.  Blood sugars varying.  Problems with low blood sugars.   He has had issues with not eating regular meals.  Gets up late.   Will go long periods without eating.  We discussed this at length again today.  We discussed adjusting his insulin.  Just had knee surgery.  Had physical therapy.  Doing home exercise.  Is walking more.  Having increased pain in his hips.  Sitting relieves.  Some constipation.  Taking a stool softener, laxative and increased fiber.  Had a bowel movement today.  Trying to stay hydrated.  Prostate being followed by Dr Achilles Dunkope.         Past Medical History  Diagnosis Date  . Arthritis   . Sleep apnea   . COPD (chronic obstructive pulmonary  disease)   . Hypercholesterolemia   . Hypertension   . Thrombocytopenia   . Renal insufficiency   . Diverticulosis   . History of colon polyps   . 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 CHF (congestive heart failure)     Current Outpatient Prescriptions on File Prior to Visit  Medication Sig Dispense Refill  . amiodarone (PACERONE) 200 MG tablet Take 1 tablet (200 mg total) by mouth daily.  30 tablet  6  . amoxicillin-clavulanate (AUGMENTIN) 875-125 MG per tablet Take 1 tablet by mouth 2 (two) times daily.  20 tablet  0  . atenolol (TENORMIN) 50 MG tablet TAKE 1 TABLET (50 MG TOTAL) BY MOUTH 2 (TWO) TIMES DAILY.  60 tablet  6  . atorvastatin (LIPITOR) 20 MG tablet Take 20 mg by mouth daily.      . budesonide-formoterol (SYMBICORT) 80-4.5 MCG/ACT inhaler Inhale 2 puffs into the lungs 2 (two) times daily.  1 Inhaler  12  . cloNIDine (CATAPRES) 0.1 MG tablet Take 1 tablet (0.1 mg total) by mouth 2 (two) times daily.  180 tablet  3  . escitalopram (LEXAPRO) 10 MG tablet TAKE 1/2 TABLET BY mouth daily      . esomeprazole (NEXIUM) 40 MG capsule Take 40 mg by mouth daily before breakfast.      .  glucose blood test strip Use three times a day as instructed  150 each  5  . glyBURIDE (DIABETA) 5 MG tablet Take 1 tablet (5 mg total) by mouth daily with breakfast.  30 tablet  5  . HUMALOG 100 UNIT/ML injection INJECT 24 UNITS AT BREAKFAST, 22 UNITS AT LUNCH & 14 UNITS AT SUPPER  30 mL  3  . hyoscyamine (LEVBID) 0.375 MG 12 hr tablet 1/2 TABLET BY MOUTH TWICE A DAY FOR 1 WEEK, THEN INCREASE TO 1 TABLET TWICE A DAY  60 tablet  1  . insulin glargine (LANTUS) 100 UNIT/ML injection TAKE 26 UNITS IN THE AM AND 35 UNITS IN THE PM.      . levalbuterol (XOPENEX HFA) 45 MCG/ACT inhaler Inhale 2 puffs into the lungs every 4 (four) hours as needed for wheezing.  1 Inhaler  5  . LYRICA 75 MG capsule TAKE 1 CAPSULE TWICE DAILY  60 capsule  5  . Multiple Vitamin (MULTIVITAMIN)  tablet Take 1 tablet by mouth daily.      . potassium chloride SA (K-DUR,KLOR-CON) 20 MEQ tablet Takes 1/2 tablet am and 1/2 tablet pm daily.      . sitaGLIPtin (JANUVIA) 100 MG tablet TAKE 1 TABLET BY MOUTH ONCE A DAY  30 tablet  5  . Tamsulosin HCl (FLOMAX) 0.4 MG CAPS Take 0.4 mg by mouth daily.       Marland Kitchen torsemide (DEMADEX) 20 MG tablet 2 (two) times daily. Takes 40 mg am and 20 mg pm daily.       No current facility-administered medications on file prior to visit.    Review of Systems Patient denies any headache, lightheadedness or dizziness.  No sinus or allergy symptoms.  No chest pain or palpitations.  No increased heart rate.  No cough or congestion.  Breathing is better.   Has been using his inhalers on a regular basis now.   No nausea or vomiting.  No abdominal pain or cramping.   No urine change.   Still seeing Dr Franky Macho for his back.  Sugars varying.  Some lows.  Discussed the importance of eating regular meals and not going long periods without eating.  Discussed how to adjust his insulin.  Is s/p knee surgery.  He is walking better.  Completed physical therapy.  Doing home exercise now.  Has had some issues with constipation.  We discussed using miralax.  Had a good bowel movement today.  Staying hydrated.       Objective:   Physical Exam  Filed Vitals:   08/23/13 1117  BP: 130/80  Pulse: 55  Temp: 97.7 F (36.5 C)   Blood pressure recheck:  18/68  79 year old male in no acute distress.  HEENT:  Nares - clear.  Oropharynx - without lesions. NECK:  Supple.  Nontender.  No audible carotid bruit.  HEART:  Appears to be regular.   Rate controlled.    LUNGS:  No crackles or wheezing audible.  Respirations even and unlabored.   RADIAL PULSE:  Equal bilaterally.  ABDOMEN:  Nontender.  Bowel sounds present and normal.  No audible abdominal bruit.    EXTREMITIES:  Pitting edema - stable.   No increased erythema.   SKIN:  No lesions.    FEET:  No lesions.      Assessment &  Plan:  MSK.  S/p knee surgery.  Doing home exercises now.   Walking better.  Continues to f/u with Dr Gavin Potters.  HEALTH MAINTENANCE.  Prostate and PSAs through Dr Achilles Dunk.  Colonoscopy 10/21/11 - diverticulosis and several polyps. Due follow up colonoscopy in 2016.    I spent more than 25 minutes with this patient and more than 50% of the time was spent in consultation regarding the above.

## 2013-08-28 NOTE — Assessment & Plan Note (Signed)
Swelling better.  Support hose.  Follow.    

## 2013-08-30 ENCOUNTER — Other Ambulatory Visit: Payer: Self-pay | Admitting: Internal Medicine

## 2013-09-21 ENCOUNTER — Other Ambulatory Visit (INDEPENDENT_AMBULATORY_CARE_PROVIDER_SITE_OTHER): Payer: Medicare Other

## 2013-09-21 DIAGNOSIS — E7800 Pure hypercholesterolemia, unspecified: Secondary | ICD-10-CM

## 2013-09-21 DIAGNOSIS — N058 Unspecified nephritic syndrome with other morphologic changes: Secondary | ICD-10-CM

## 2013-09-21 DIAGNOSIS — D696 Thrombocytopenia, unspecified: Secondary | ICD-10-CM

## 2013-09-21 DIAGNOSIS — E0821 Diabetes mellitus due to underlying condition with diabetic nephropathy: Secondary | ICD-10-CM

## 2013-09-21 DIAGNOSIS — R609 Edema, unspecified: Secondary | ICD-10-CM

## 2013-09-21 DIAGNOSIS — E1329 Other specified diabetes mellitus with other diabetic kidney complication: Secondary | ICD-10-CM

## 2013-09-21 LAB — CBC WITH DIFFERENTIAL/PLATELET
BASOS PCT: 0 % (ref 0.0–3.0)
Basophils Absolute: 0 10*3/uL (ref 0.0–0.1)
EOS ABS: 0.1 10*3/uL (ref 0.0–0.7)
EOS PCT: 1 % (ref 0.0–5.0)
HEMATOCRIT: 32.1 % — AB (ref 39.0–52.0)
Hemoglobin: 10.7 g/dL — ABNORMAL LOW (ref 13.0–17.0)
LYMPHS ABS: 0.9 10*3/uL (ref 0.7–4.0)
Lymphocytes Relative: 16 % (ref 12.0–46.0)
MCHC: 33.2 g/dL (ref 30.0–36.0)
MCV: 93.9 fl (ref 78.0–100.0)
MONO ABS: 1.4 10*3/uL — AB (ref 0.1–1.0)
Monocytes Relative: 25 % — ABNORMAL HIGH (ref 3.0–12.0)
Neutro Abs: 3.2 10*3/uL (ref 1.4–7.7)
Neutrophils Relative %: 58 % (ref 43.0–77.0)
Platelets: 108 10*3/uL — ABNORMAL LOW (ref 150.0–400.0)
RBC: 3.42 Mil/uL — AB (ref 4.22–5.81)
RDW: 14.7 % — ABNORMAL HIGH (ref 11.5–14.6)
WBC: 5.5 10*3/uL (ref 4.5–10.5)

## 2013-09-21 LAB — LIPID PANEL
CHOLESTEROL: 152 mg/dL (ref 0–200)
HDL: 41.4 mg/dL (ref >39.00)
LDL Cholesterol: 88 mg/dL (ref 0–99)
Total CHOL/HDL Ratio: 4
Triglycerides: 113 mg/dL (ref 0.0–149.0)
VLDL: 22.6 mg/dL (ref 0.0–40.0)

## 2013-09-21 LAB — BASIC METABOLIC PANEL
BUN: 22 mg/dL (ref 6–23)
CHLORIDE: 104 meq/L (ref 96–112)
CO2: 29 mEq/L (ref 19–32)
Calcium: 8.4 mg/dL (ref 8.4–10.5)
Creat: 1.7 mg/dL — ABNORMAL HIGH (ref 0.50–1.35)
Glucose, Bld: 154 mg/dL — ABNORMAL HIGH (ref 70–99)
Potassium: 4.1 mEq/L (ref 3.5–5.3)
SODIUM: 144 meq/L (ref 135–145)

## 2013-09-21 LAB — HEPATIC FUNCTION PANEL
ALBUMIN: 3.7 g/dL (ref 3.5–5.2)
ALT: 14 U/L (ref 0–53)
AST: 20 U/L (ref 0–37)
Alkaline Phosphatase: 78 U/L (ref 39–117)
Bilirubin, Direct: 0.1 mg/dL (ref 0.0–0.3)
TOTAL PROTEIN: 6.4 g/dL (ref 6.0–8.3)
Total Bilirubin: 0.8 mg/dL (ref 0.3–1.2)

## 2013-09-21 LAB — HEMOGLOBIN A1C: Hgb A1c MFr Bld: 6.8 % — ABNORMAL HIGH (ref 4.6–6.5)

## 2013-09-22 ENCOUNTER — Telehealth: Payer: Self-pay | Admitting: Internal Medicine

## 2013-09-22 ENCOUNTER — Encounter: Payer: Self-pay | Admitting: *Deleted

## 2013-09-22 ENCOUNTER — Ambulatory Visit: Payer: Medicare Other

## 2013-09-22 DIAGNOSIS — D649 Anemia, unspecified: Secondary | ICD-10-CM

## 2013-09-22 DIAGNOSIS — E7800 Pure hypercholesterolemia, unspecified: Secondary | ICD-10-CM

## 2013-09-22 LAB — IBC PANEL
Iron: 67 ug/dL (ref 42–165)
SATURATION RATIOS: 19.5 % — AB (ref 20.0–50.0)
Transferrin: 244.9 mg/dL (ref 212.0–360.0)

## 2013-09-22 NOTE — Telephone Encounter (Signed)
Orders placed for add on labs.  

## 2013-09-23 LAB — VITAMIN B12: VITAMIN B 12: 552 pg/mL (ref 211–911)

## 2013-09-23 LAB — FERRITIN: Ferritin: 34.9 ng/mL (ref 22.0–322.0)

## 2013-09-26 ENCOUNTER — Other Ambulatory Visit (INDEPENDENT_AMBULATORY_CARE_PROVIDER_SITE_OTHER): Payer: Medicare Other

## 2013-09-26 ENCOUNTER — Other Ambulatory Visit: Payer: Medicare Other

## 2013-09-26 ENCOUNTER — Encounter: Payer: Self-pay | Admitting: *Deleted

## 2013-09-26 DIAGNOSIS — N289 Disorder of kidney and ureter, unspecified: Secondary | ICD-10-CM

## 2013-09-26 LAB — BASIC METABOLIC PANEL
BUN: 18 mg/dL (ref 6–23)
CALCIUM: 8.8 mg/dL (ref 8.4–10.5)
CO2: 28 meq/L (ref 19–32)
Chloride: 106 mEq/L (ref 96–112)
Creatinine, Ser: 1.8 mg/dL — ABNORMAL HIGH (ref 0.4–1.5)
GFR: 39.35 mL/min — ABNORMAL LOW (ref >60.00)
Glucose, Bld: 258 mg/dL — ABNORMAL HIGH (ref 70–99)
POTASSIUM: 4.5 meq/L (ref 3.5–5.1)
SODIUM: 144 meq/L (ref 135–145)

## 2013-09-28 ENCOUNTER — Other Ambulatory Visit: Payer: Self-pay | Admitting: Cardiovascular Disease

## 2013-09-28 ENCOUNTER — Other Ambulatory Visit: Payer: Self-pay | Admitting: Internal Medicine

## 2013-09-28 ENCOUNTER — Encounter: Payer: Self-pay | Admitting: *Deleted

## 2013-09-28 NOTE — Telephone Encounter (Signed)
Unread mychart message mailed to patient 

## 2013-09-29 ENCOUNTER — Encounter: Payer: Self-pay | Admitting: Internal Medicine

## 2013-10-14 ENCOUNTER — Telehealth: Payer: Self-pay | Admitting: *Deleted

## 2013-10-14 DIAGNOSIS — N289 Disorder of kidney and ureter, unspecified: Secondary | ICD-10-CM

## 2013-10-14 DIAGNOSIS — D649 Anemia, unspecified: Secondary | ICD-10-CM

## 2013-10-14 NOTE — Telephone Encounter (Signed)
Pt is coming in for labs on  Monday what labs and dx? 

## 2013-10-17 ENCOUNTER — Other Ambulatory Visit (INDEPENDENT_AMBULATORY_CARE_PROVIDER_SITE_OTHER): Payer: Medicare Other

## 2013-10-17 DIAGNOSIS — D649 Anemia, unspecified: Secondary | ICD-10-CM

## 2013-10-17 DIAGNOSIS — N289 Disorder of kidney and ureter, unspecified: Secondary | ICD-10-CM

## 2013-10-17 LAB — BASIC METABOLIC PANEL
BUN: 16 mg/dL (ref 6–23)
CALCIUM: 8.8 mg/dL (ref 8.4–10.5)
CO2: 32 meq/L (ref 19–32)
CREATININE: 2 mg/dL — AB (ref 0.4–1.5)
Chloride: 102 mEq/L (ref 96–112)
GFR: 35.65 mL/min — AB (ref >60.00)
Glucose, Bld: 130 mg/dL — ABNORMAL HIGH (ref 70–99)
Potassium: 4.2 mEq/L (ref 3.5–5.1)
SODIUM: 140 meq/L (ref 135–145)

## 2013-10-17 NOTE — Telephone Encounter (Signed)
Order placed for labs.

## 2013-10-18 LAB — CBC WITH DIFFERENTIAL/PLATELET
HCT: 32 % — ABNORMAL LOW (ref 39.0–52.0)
HEMOGLOBIN: 10.6 g/dL — AB (ref 13.0–17.0)
MCHC: 33.3 g/dL (ref 30.0–36.0)
MCV: 93.6 fl (ref 78.0–100.0)
Platelets: 108 10*3/uL — ABNORMAL LOW (ref 150.0–400.0)
RBC: 3.42 Mil/uL — AB (ref 4.22–5.81)
RDW: 15 % — ABNORMAL HIGH (ref 11.5–14.6)
WBC: 6.5 10*3/uL (ref 4.5–10.5)

## 2013-10-19 ENCOUNTER — Other Ambulatory Visit: Payer: Self-pay | Admitting: Internal Medicine

## 2013-10-21 ENCOUNTER — Other Ambulatory Visit: Payer: Self-pay | Admitting: Internal Medicine

## 2013-10-21 ENCOUNTER — Encounter: Payer: Self-pay | Admitting: Internal Medicine

## 2013-10-21 ENCOUNTER — Ambulatory Visit: Payer: Medicare Other | Admitting: Internal Medicine

## 2013-10-26 ENCOUNTER — Other Ambulatory Visit: Payer: Self-pay | Admitting: *Deleted

## 2013-10-26 MED ORDER — ATENOLOL 50 MG PO TABS: ORAL_TABLET | ORAL | Status: DC

## 2013-10-26 NOTE — Telephone Encounter (Signed)
Requested Prescriptions   Signed Prescriptions Disp Refills  . atenolol (TENORMIN) 50 MG tablet 180 tablet 3    Sig: TAKE 1 TABLET (50 MG TOTAL) BY MOUTH 2 (TWO) TIMES DAILY.    Authorizing Provider: Antonieta IbaGOLLAN, TIMOTHY J    Ordering User: Kendrick FriesLOPEZ, Marjorie Deprey C

## 2013-10-31 ENCOUNTER — Ambulatory Visit (INDEPENDENT_AMBULATORY_CARE_PROVIDER_SITE_OTHER): Payer: Medicare Other | Admitting: Cardiovascular Disease

## 2013-10-31 ENCOUNTER — Encounter: Payer: Self-pay | Admitting: Cardiovascular Disease

## 2013-10-31 VITALS — BP 142/78 | HR 56 | Ht 73.0 in | Wt 304.8 lb

## 2013-10-31 DIAGNOSIS — N289 Disorder of kidney and ureter, unspecified: Secondary | ICD-10-CM

## 2013-10-31 DIAGNOSIS — E7800 Pure hypercholesterolemia, unspecified: Secondary | ICD-10-CM

## 2013-10-31 DIAGNOSIS — I10 Essential (primary) hypertension: Secondary | ICD-10-CM

## 2013-10-31 DIAGNOSIS — I5032 Chronic diastolic (congestive) heart failure: Secondary | ICD-10-CM

## 2013-10-31 DIAGNOSIS — G4733 Obstructive sleep apnea (adult) (pediatric): Secondary | ICD-10-CM

## 2013-10-31 DIAGNOSIS — I4891 Unspecified atrial fibrillation: Secondary | ICD-10-CM

## 2013-10-31 DIAGNOSIS — R0602 Shortness of breath: Secondary | ICD-10-CM

## 2013-10-31 MED ORDER — TORSEMIDE 20 MG PO TABS: 40.0000 mg | ORAL_TABLET | Freq: Two times a day (BID) | ORAL | Status: DC

## 2013-10-31 NOTE — Assessment & Plan Note (Addendum)
Maintaining normal sinus rhythm. We'll continue current medications. Amiodarone screening labs on his next visit

## 2013-10-31 NOTE — Assessment & Plan Note (Signed)
Blood pressure is well controlled on today's visit. No changes made to the medications. 

## 2013-10-31 NOTE — Assessment & Plan Note (Signed)
Recent creatinine 2.0. BUN normal. Likely contributing to difficulty with diuresis. Will increase torsemide to 40 mg twice a day. If no improvement, could add metolazone when necessary

## 2013-10-31 NOTE — Assessment & Plan Note (Signed)
Recommended he stay on his Lipitor 20 mg daily. If LFTs climb, may need to decrease the dose in half

## 2013-10-31 NOTE — Assessment & Plan Note (Signed)
8 pound weight loss over the past 3 months. Recommended he increase torsemide to 40 mg twice a day given his continued pitting edema. Recommended he cut back on his fluid intake

## 2013-10-31 NOTE — Patient Instructions (Addendum)
You are doing well. Please increase the torsemide up to 40 mg twice a day  Please decrease the torsemide back to 40 mg in AM and 20 mg in PM when your weight gets to 290  Please take citracel daily  Take miralex three times a week  Please call us if you have new issues that need to be addressed before your next appt.  Your physician wants you to follow-up in: 3 months.

## 2013-10-31 NOTE — Assessment & Plan Note (Signed)
Shortness of breath likely multifactorial from COPD, diastolic CHF, obesity and deconditioning.

## 2013-10-31 NOTE — Progress Notes (Signed)
Patient ID: [name], male    DOB: 03-31-1935, 79 y.o.   MRN: [id number]  HPI Comments: 79 year old male with past history of morbid obesity, hypertension, hypercholesterolemia, diabetes with renal insufficiency, thrombocytopenia patient of, Dr. Lorin PicketScott,  episode of atrial fibrillation in early 2014.  history of COPD, obstructive sleep apnea, on CPAP.  smoked for 25 years, stopped 30 years ago. On his initial clinic visit, he was started on rate control medications for atrial fibrillation  and diuretic for significant lower extremity edema and shortness of breath. Also started on anticoagulation.  elective cardioversion on October 27 2012 which was successful.  he continues to take torsemide 40 mg in the morning, 20 mg in the p.m. He is unsure of his medications Compared to January 2015, weight is now down 8 pounds. Back in December 2014, weight was slightly lower than today He continues to have significant leg edema that is pitting    in the hospital 06/26/2013 for shortness of breath BNP 4500. He had aggressive diuresis and was -11 L through his hospital course,  ejection fraction 55-60%, creatinine 2.1 but went as high as 2.4, hemoglobin A1c 7.2. He was in normal sinus rhythm during his hospital course  He reports that he Sits with his legs down most of the day. Weight continues to be a major problem.  moderate shortness of breath at baseline. He blames the leg swelling on Lyrica. Reports this medication caused 50 pound weight gain Most recent creatinine 1.5. Total cholesterol 167, LDL 101, HDL 42   History of  severe neck pain. He reports having an episode of neck pain previously that seemed to resolve after an MRI. Then he developed hip pain and was seeking care from an orthopedic specialist.  He reports being compliant with his CPAP  Echocardiogram  shows normal ejection fraction with moderately dilated left atrium, high normal right ventricular systolic pressure   baseline  creatinine  1.5 to 1.8   EKG today shows normal sinus rhythm with rate 56 beats per minute, right bundle branch block, no other significant ST or T wave changes Reports usual heart rate is in the 40s and he has no symptoms  Past Medical History . Arthritis  . 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 Sleep apnea  . COPD (chronic obstructive pulmonary disease)  . Hypercholesterolemia  . Hypertension  . Thrombocytopenia  . Renal insufficiency  . Diverticulosis  . History of colon polyps     Outpatient Encounter Prescriptions as of 10/31/2013  Medication Sig  . amiodarone (PACERONE) 200 MG tablet Take 1 tablet (200 mg total) by mouth daily.  Marland Kitchen. atenolol (TENORMIN) 50 MG tablet TAKE 1 TABLET (50 MG TOTAL) BY MOUTH 2 (TWO) TIMES DAILY.  Marland Kitchen. atorvastatin (LIPITOR) 20 MG tablet Take 20 mg by mouth daily.  . budesonide-formoterol (SYMBICORT) 80-4.5 MCG/ACT inhaler Inhale 2 puffs into the lungs 2 (two) times daily.  . cloNIDine (CATAPRES) 0.1 MG tablet Take 1 tablet (0.1 mg total) by mouth 2 (two) times daily.  Marland Kitchen. escitalopram (LEXAPRO) 10 MG tablet TAKE 1/2 TABLET BY MOUTH ONCE A DAY  . esomeprazole (NEXIUM) 40 MG capsule Take 40 mg by mouth daily before breakfast.  . glucose blood test strip Use three times a day as instructed  . HUMALOG 100 UNIT/ML injection INJECT 24 UNITS AT BREAKFAST, 22 UNITS AT LUNCH & 14 UNITS AT SUPPER  . hyoscyamine (LEVBID) 0.375 MG 12 hr tablet Take 1 tablet (0.375 mg  total) by mouth 2 (two) times daily.  . insulin glargine (LANTUS) 100 UNIT/ML injection TAKE 26 UNITS IN THE AM AND 35 UNITS IN THE PM.  . levalbuterol (XOPENEX HFA) 45 MCG/ACT inhaler Inhale 2 puffs into the lungs every 4 (four) hours as needed for wheezing.  Marland Kitchen. LYRICA 75 MG capsule TAKE 1 CAPSULE TWICE DAILY  . Multiple Vitamin (MULTIVITAMIN) tablet Take 1 tablet by mouth daily.  . potassium chloride SA (K-DUR,KLOR-CON) 20 MEQ tablet Takes 1/2 tablet am and 1/2 tablet pm daily.  . sitaGLIPtin  (JANUVIA) 100 MG tablet TAKE 1 TABLET BY MOUTH ONCE A DAY  . Tamsulosin HCl (FLOMAX) 0.4 MG CAPS Take 0.4 mg by mouth daily.   Marland Kitchen. torsemide (DEMADEX) 20 MG tablet 2 (two) times daily. Takes 40 mg am and 20 mg pm daily.   Review of Systems  Constitutional: Negative.   HENT: Negative.   Eyes: Negative.   Respiratory: Negative.   Cardiovascular: Positive for leg swelling.  Gastrointestinal: Negative.   Endocrine: Negative.   Musculoskeletal: Negative.   Skin: Negative.   Allergic/Immunologic: Negative.   Neurological: Negative.   Hematological: Negative.   Psychiatric/Behavioral: Negative.   All other systems reviewed and are negative.   BP 142/78  Pulse 56  Ht 6\' 1"  (1.854 m)  Wt 304 lb 12 oz (138.234 kg)  BMI 40.22 kg/m2  Physical Exam  Nursing note and vitals reviewed. Constitutional: He is oriented to person, place, and time. He appears well-developed and well-nourished.  Morbidly obese  HENT:  Head: Normocephalic.  Nose: Nose normal.  Mouth/Throat: Oropharynx is clear and moist.  Eyes: Conjunctivae are normal. Pupils are equal, round, and reactive to light.  Neck: Normal range of motion. Neck supple. No JVD present.  Cardiovascular: Normal rate, S1 normal, S2 normal, normal heart sounds and intact distal pulses.  Exam reveals no gallop and no friction rub.   No murmur heard. 1 to 2+ pitting edema to below the knees  Pulmonary/Chest: Effort normal. No respiratory distress. He has no wheezes. He exhibits no tenderness.  Abdominal: Soft. Bowel sounds are normal. He exhibits no distension. There is no tenderness.  Musculoskeletal: Normal range of motion. He exhibits no edema and no tenderness.  Lymphadenopathy:    He has no cervical adenopathy.  Neurological: He is alert and oriented to person, place, and time. Coordination normal.  Skin: Skin is warm and dry. No rash noted. No erythema.  Psychiatric: He has a normal mood and affect. His behavior is normal. Judgment and  thought content normal.      Assessment and Plan

## 2013-10-31 NOTE — Assessment & Plan Note (Signed)
He reports being compliant with his CPAP 

## 2013-11-14 ENCOUNTER — Encounter: Payer: Self-pay | Admitting: Internal Medicine

## 2013-11-14 ENCOUNTER — Ambulatory Visit (INDEPENDENT_AMBULATORY_CARE_PROVIDER_SITE_OTHER): Payer: Medicare Other | Admitting: Internal Medicine

## 2013-11-14 ENCOUNTER — Emergency Department: Payer: Self-pay | Admitting: Emergency Medicine

## 2013-11-14 VITALS — BP 122/50 | HR 59 | Temp 99.0°F | Ht 73.0 in | Wt 308.5 lb

## 2013-11-14 DIAGNOSIS — E1329 Other specified diabetes mellitus with other diabetic kidney complication: Secondary | ICD-10-CM

## 2013-11-14 DIAGNOSIS — N289 Disorder of kidney and ureter, unspecified: Secondary | ICD-10-CM

## 2013-11-14 DIAGNOSIS — E7800 Pure hypercholesterolemia, unspecified: Secondary | ICD-10-CM

## 2013-11-14 DIAGNOSIS — N058 Unspecified nephritic syndrome with other morphologic changes: Secondary | ICD-10-CM

## 2013-11-14 DIAGNOSIS — J4489 Other specified chronic obstructive pulmonary disease: Secondary | ICD-10-CM

## 2013-11-14 DIAGNOSIS — M542 Cervicalgia: Secondary | ICD-10-CM

## 2013-11-14 DIAGNOSIS — G4733 Obstructive sleep apnea (adult) (pediatric): Secondary | ICD-10-CM

## 2013-11-14 DIAGNOSIS — J449 Chronic obstructive pulmonary disease, unspecified: Secondary | ICD-10-CM

## 2013-11-14 DIAGNOSIS — I4891 Unspecified atrial fibrillation: Secondary | ICD-10-CM

## 2013-11-14 DIAGNOSIS — I10 Essential (primary) hypertension: Secondary | ICD-10-CM

## 2013-11-14 DIAGNOSIS — E0821 Diabetes mellitus due to underlying condition with diabetic nephropathy: Secondary | ICD-10-CM

## 2013-11-14 DIAGNOSIS — R609 Edema, unspecified: Secondary | ICD-10-CM

## 2013-11-14 DIAGNOSIS — R600 Localized edema: Secondary | ICD-10-CM

## 2013-11-14 NOTE — Progress Notes (Signed)
Pre visit review using our clinic review tool, if applicable. No additional management support is needed unless otherwise documented below in the visit note. 

## 2013-11-19 ENCOUNTER — Telehealth: Payer: Self-pay | Admitting: Internal Medicine

## 2013-11-19 ENCOUNTER — Encounter: Payer: Self-pay | Admitting: Internal Medicine

## 2013-11-19 DIAGNOSIS — M542 Cervicalgia: Secondary | ICD-10-CM | POA: Insufficient documentation

## 2013-11-19 NOTE — Assessment & Plan Note (Signed)
Symptoms improved.  Rescue inhaler when necessary. Continue Symbicort as directed.  Breathing stable.

## 2013-11-19 NOTE — Assessment & Plan Note (Signed)
Swelling better.  Support hose.  Follow.

## 2013-11-19 NOTE — Assessment & Plan Note (Signed)
Blood pressure has been under reasonable control.  Follow pressures.  Follow metabolic panel.   

## 2013-11-19 NOTE — Assessment & Plan Note (Signed)
Neck pain and head pain as outlined.  Discussed at length with him today.  He is already taking oxycodone and muscle relaxer.  Not helping.  Increased pain recently.  Given the amount of pain and discomfort, refer to ER for further evaluation and w/up.  Pt comfortable with this plan.  Preferred to drive himself to the hospital.  Refused for me to call someone to take him.

## 2013-11-19 NOTE — Assessment & Plan Note (Signed)
Off cholesterol medication.   Have discussed with him regarding the need for treatment.  Have discussed starting Lipitor.  He declined.  Low cholesterol diet and exercise.  Follow.

## 2013-11-19 NOTE — Assessment & Plan Note (Signed)
Seeing Dr Mariah MillingGollan.  Per hospitalization, back on xarelto.  Appears to be in SR.

## 2013-11-19 NOTE — Assessment & Plan Note (Signed)
Have discussed importance of diet.  Have discussed importance of eating a snack before bed, eating regular meals and not going long periods without eating.  Problems with lows intermittently.   Have discussed adjusting insulin if low sugars.

## 2013-11-19 NOTE — Progress Notes (Signed)
Subjective:    Patient ID: [name], male    DOB: 08/03/1934, 79 y.o.   MRN: [id number]  HPI 79 year old male with past history of hypertension, hypercholesterolemia, diabetes with renal insufficiency (previously followed by Dr Hyacinth MeekerMiller) and thrombocytopenia who comes in today for a scheduled follow up.  He was hospitalized from 06/24/13 - 06/28/13 with acute respiratory failure secondary to congestive heart failure.  He presented with worsening lower extremity edema and significant weight gain.  He was started on IV Lasix and responded well.  Was changed to torsemide prior to discharge.  Had ECHO that revealed EF 55-60%.  Has known atrial fibrillation.  Followed by Dr Mariah MillingGollan.  Was noted to be in SR in the hospital.  Per notes, was started back on Xarelto.   Also has known COPD and is followed by Dr Kendrick FriesMcQuaid.  He recently had noticed increased issues with dysphagia.  Was evaluated by GI.  Had an UGI and EGD.  States everything checked out fine. No significant acid reflux.  Swallowing is better on omeprazole.   Denies any chest pain.  No increased heart racing or palpitations.  No increased cough or congestion.  Does have problems with lower extremity swelling.  Better in the am and worse as the day progresses.  Overall improved.  Breathing has improved.  Blood sugars varying.  Problems with low blood sugars.   We have discussed adjusting his insulin.  Prostate being followed by Dr Achilles Dunkope.  Her main complaint today is neck pain and head pain.  Minimal movement aggravates.  Grabs his neck with any minimal movement.  He is taking oxycodone and a muscle relaxer.  Seeing Dr Yves Dillhasnis.  His neck has been an issue for a while, but has worsened one week ago.  Having some constipation with the pain medications.          Past Medical History  Diagnosis Date  . Arthritis   . Sleep apnea   . COPD (chronic obstructive pulmonary disease)   . Hypercholesterolemia   . Hypertension   . Thrombocytopenia   . Renal  insufficiency   . Diverticulosis   . History of colon polyps   . 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 CHF (congestive heart failure)     Current Outpatient Prescriptions on File Prior to Visit  Medication Sig Dispense Refill  . amiodarone (PACERONE) 200 MG tablet Take 1 tablet (200 mg total) by mouth daily.  30 tablet  6  . atenolol (TENORMIN) 50 MG tablet TAKE 1 TABLET (50 MG TOTAL) BY MOUTH 2 (TWO) TIMES DAILY.  180 tablet  3  . atorvastatin (LIPITOR) 20 MG tablet Take 20 mg by mouth daily.      . budesonide-formoterol (SYMBICORT) 80-4.5 MCG/ACT inhaler Inhale 2 puffs into the lungs 2 (two) times daily.  1 Inhaler  12  . cloNIDine (CATAPRES) 0.1 MG tablet Take 1 tablet (0.1 mg total) by mouth 2 (two) times daily.  180 tablet  3  . escitalopram (LEXAPRO) 10 MG tablet TAKE 1/2 TABLET BY MOUTH ONCE A DAY  15 tablet  2  . esomeprazole (NEXIUM) 40 MG capsule Take 40 mg by mouth daily before breakfast.      . glucose blood test strip Use three times a day as instructed  150 each  5  . HUMALOG 100 UNIT/ML injection INJECT 24 UNITS AT BREAKFAST, 22 UNITS AT LUNCH & 14 UNITS AT SUPPER  30 mL  3  . hyoscyamine (LEVBID) 0.375 MG 12 hr tablet Take 1 tablet (0.375 mg total) by mouth 2 (two) times daily.  60 tablet  1  . insulin glargine (LANTUS) 100 UNIT/ML injection TAKE 26 UNITS IN THE AM AND 35 UNITS IN THE PM.      . levalbuterol (XOPENEX HFA) 45 MCG/ACT inhaler Inhale 2 puffs into the lungs every 4 (four) hours as needed for wheezing.  1 Inhaler  5  . LYRICA 75 MG capsule TAKE 1 CAPSULE TWICE DAILY  60 capsule  5  . Multiple Vitamin (MULTIVITAMIN) tablet Take 1 tablet by mouth daily.      . potassium chloride SA (K-DUR,KLOR-CON) 20 MEQ tablet Takes 1/2 tablet am and 1/2 tablet pm daily.      . sitaGLIPtin (JANUVIA) 100 MG tablet TAKE 1 TABLET BY MOUTH ONCE A DAY  30 tablet  5  . Tamsulosin HCl (FLOMAX) 0.4 MG CAPS Take 0.4 mg by mouth daily.       Marland Kitchen. torsemide  (DEMADEX) 20 MG tablet Take 2 tablets (40 mg total) by mouth 2 (two) times daily.  120 tablet  6   No current facility-administered medications on file prior to visit.    Review of Systems Patient denies lightheadedness or dizziness.  Some head pain and neck pain as outlined.  Increased significantly recently.  No sinus or allergy symptoms.  No chest pain or palpitations.  No increased heart rate.  No cough or congestion.  Breathing stable.   No nausea or vomiting.  No abdominal pain or cramping.   No urine change.   Still seeing Dr Franky Machoabbell for his back.  Sugars varying.  Some lows.  Have discussed the importance of eating regular meals and not going long periods without eating.  Discussed how to adjust his insulin.  Is s/p knee surgery.  Has had some issues with constipation.  Increased neck and head pain as outlined.        Objective:   Physical Exam  Filed Vitals:   11/14/13 1609  BP: 122/50  Pulse: 59  Temp: 99 F (7437.592 C)   79 year old male in no acute distress.  HEENT:  Nares - clear.  Oropharynx - without lesions. NECK:  No audible carotid bruit.  Increased pain with minimal rotation of his head.  Increased pain in his head with rotation.  HEART:  Rate controlled.    LUNGS:  No crackles or wheezing audible.  Respirations even and unlabored.   RADIAL PULSE:  Equal bilaterally.  ABDOMEN:  Nontender.  Bowel sounds present and normal.  No audible abdominal bruit.    EXTREMITIES:  Pitting edema - stable.   No increased erythema.   SKIN:  No lesions.       Assessment & Plan:  MSK.  S/p knee surgery.  Doing home exercises now.   [facility].       HEALTH MAINTENANCE.  Prostate and PSAs through Dr Achilles Dunkope.  Colonoscopy 10/21/11 - diverticulosis and several polyps. Due follow up colonoscopy in 2016.    I spent more than 25 minutes with this patient and more than 50% of the time was spent in consultation regarding the above.

## 2013-11-19 NOTE — Telephone Encounter (Signed)
Pt went to ER 11/14/13.  Need records.  Thanks.

## 2013-11-19 NOTE — Assessment & Plan Note (Signed)
Seeing nephrology.  Follow metabolic panel.  Avoid nephrotoxic agents.

## 2013-11-19 NOTE — Assessment & Plan Note (Signed)
Using CPAP.  Follow.  

## 2013-11-21 NOTE — Telephone Encounter (Signed)
Records placed in your folder.

## 2013-11-21 NOTE — Telephone Encounter (Signed)
Records requested

## 2013-11-21 NOTE — Telephone Encounter (Signed)
Will review 

## 2013-11-23 ENCOUNTER — Other Ambulatory Visit: Payer: Self-pay | Admitting: Internal Medicine

## 2013-11-27 ENCOUNTER — Other Ambulatory Visit: Payer: Self-pay | Admitting: Internal Medicine

## 2013-11-28 ENCOUNTER — Telehealth: Payer: Self-pay | Admitting: *Deleted

## 2013-11-28 NOTE — Telephone Encounter (Signed)
Rx faxed to pharm

## 2013-11-28 NOTE — Telephone Encounter (Signed)
If increased cough and congestion and not sleeping, needs evaluation.  Make sure no sob.  (he was recently admitted with CHF).  If sob, needs evaluation today.

## 2013-11-28 NOTE — Telephone Encounter (Signed)
Refilled lyrica #[id number] with two refills.

## 2013-11-28 NOTE — Telephone Encounter (Signed)
Left message for pt to return my call.

## 2013-11-28 NOTE — Telephone Encounter (Signed)
Needing refill ASAP

## 2013-11-28 NOTE — Telephone Encounter (Signed)
Last visit 11/14/13, ok refill?

## 2013-11-28 NOTE — Telephone Encounter (Signed)
[facility] nurse contacted patient today & he reported lack of sleep x 3 days, generalized weakness, decreased appetitive, increased congestion (yellow) . Pt denies SOB & his current weight is 198lbs 8oz. Please advise

## 2013-11-29 NOTE — Telephone Encounter (Signed)
If no bowel movement in a few days, this could be contributing to his nausea.  Needs to try either a suppository or enema.  If no results within several hours - repeat x 1.  Once has a good bowel movement, can start miralax daily.

## 2013-11-29 NOTE — Telephone Encounter (Signed)
Pt.notified

## 2013-11-29 NOTE — Telephone Encounter (Signed)
Pt notified & [facility]. Scheduled a back inj for 12/28/13. [facility] & left a scale at the home for him to check his weight for fluid daily. He does report SOB but he states that this is not new. His stomach does feel queasy x 2-3 days. He also states that he has not had a BM x 3 days. His Blood sugars seem to be running good.

## 2013-12-28 ENCOUNTER — Ambulatory Visit (INDEPENDENT_AMBULATORY_CARE_PROVIDER_SITE_OTHER): Payer: Medicare Other | Admitting: Adult Health

## 2013-12-28 ENCOUNTER — Other Ambulatory Visit: Payer: Self-pay | Admitting: Internal Medicine

## 2013-12-28 ENCOUNTER — Encounter: Payer: Self-pay | Admitting: Adult Health

## 2013-12-28 VITALS — BP 140/72 | HR 56 | Temp 98.3°F | Resp 18 | Ht 72.5 in | Wt 302.5 lb

## 2013-12-28 DIAGNOSIS — Z0000 Encounter for general adult medical examination without abnormal findings: Secondary | ICD-10-CM

## 2013-12-28 DIAGNOSIS — Z1211 Encounter for screening for malignant neoplasm of colon: Secondary | ICD-10-CM | POA: Insufficient documentation

## 2013-12-28 DIAGNOSIS — E119 Type 2 diabetes mellitus without complications: Secondary | ICD-10-CM

## 2013-12-28 LAB — HEMOGLOBIN A1C: Hgb A1c MFr Bld: 7.1 % — ABNORMAL HIGH (ref 4.6–6.5)

## 2013-12-28 MED ORDER — ZOSTER VACCINE LIVE 19400 UNT/0.65ML ~~LOC~~ SOLR: 0.6500 mL | Freq: Once | SUBCUTANEOUS | Status: AC

## 2013-12-28 NOTE — Progress Notes (Signed)
Pre visit review using our clinic review tool, if applicable. No additional management support is needed unless otherwise documented below in the visit note. 

## 2013-12-28 NOTE — Telephone Encounter (Signed)
Have I been the one prescribing this medication?  How often is he taking?

## 2013-12-28 NOTE — Patient Instructions (Signed)
  You had your Medicare wellness exam today.  I am referring you to [name] for your eye exam  Please have labs drawn prior to leaving the office. We will contact you once the results are available. You will need to return a stool sample to evaluate for blood.  Please schedule a followup appointment with Dr. Lorin Picket for your chronic health problems which were not addressed during your Wellness exam. Try to schedule before you leave clinic today.

## 2013-12-28 NOTE — Telephone Encounter (Signed)
Refill

## 2013-12-28 NOTE — Progress Notes (Signed)
Patient ID: Robert SmokerWilliam Rollins, male   DOB: 1934/10/01, 79 y.o.   MRN: [id number]   Subjective:    Patient ID: Robert SmokerWilliam Rollins, male    DOB: 1934/10/01, 79 y.o.   MRN: [id number]  HPI   The patient is here for annual Medicare wellness examination.   The risk factors are reflected in the social history.  The roster of all physicians providing medical care to patient is listed in the Snapshot section of the chart.  Activities of daily living:  The patient is 100% independent in all ADLs: dressing, toileting, bathing, feeding as well as independent mobility.  Instrumental Activities of daily living: The patient is 100% independent in all iADLs: cooking, driving, keeping track of finances, managing medications, shopping, using telephone and computer.  Home safety: The patient has smoke and carbon monoxide detectors in the home. Seatbelts are worn 100%.  There are no firearms at home. There is no violence in the home. No hx of IPV.  There is no risks for hepatitis, STDs or HIV. There is no history of blood transfusion. No travel history to infectious disease endemic areas of the world.  The patient has seen dentist in the last six month. Pt has seen eye doctor in the last year. No hearing impairment. They have deferred audiologic testing in the last year.  No excessive sun exposure. Discussed the need for sun protection: hats, long sleeves and use of sunscreen if there is significant sun exposure.   Diet: the importance of a healthy diet is discussed. Pt is obese. Diet needs improvement  The benefits of regular aerobic exercise were discussed. He goes to the gym regularly and walks on the treadmill. Uses elastic bands for upper body strengthening.  Depression screen: there are no signs or vegative symptoms of depression- irritability, change in appetite, anhedonia, sadness/tearfullness.  Cognitive assessment: the patient manages all their financial and personal affairs and is actively engaged. Able  to relate day,date,year and events; recalled 2/3 objects at 3 minutes.  The following portions of the patient's history were reviewed and updated as appropriate: allergies, current medications, past family history, past medical history,  past surgical history, past social history  and problem list.  Visual acuity was not assessed. Pt is referred to ophthalmologist for yearly diabetic eye exam. Hearing and body mass index were assessed and reviewed.   During the course of the visit the patient was educated and counseled about appropriate screening and preventive services including : fall prevention , diabetes screening, nutrition counseling, colorectal cancer screening, and recommended immunizations.       Past Medical History  Diagnosis Date  . Arthritis   . Sleep apnea   . COPD (chronic obstructive pulmonary disease)   . Hypercholesterolemia   . Hypertension   . Thrombocytopenia   . Renal insufficiency   . Diverticulosis   . History of colon polyps   . 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 CHF (congestive heart failure)      Past Surgical History  Procedure Laterality Date  . Cataract extraction      Left eye  . Back surgery  2000  . Trigger finger release Left   . Colonoscopy    . Cardioversion    . Medial partial knee replacement  06/2013    right     Family History  Problem Relation Age of Onset  . Arthritis Mother   . Heart disease Mother   .  Hypertension Mother   . Diabetes Mother   . Arthritis Father   . Cancer Father     prostate  . Diabetes Sister   . Heart disease Maternal Grandmother   . Diabetes Maternal Grandmother   . Heart disease Maternal Grandfather      History   Social History  . Marital Status: Married    Spouse Name: N/A    Number of Children: N/A  . Years of Education: N/A   Occupational History  . Not on file.   Social History Main Topics  . Smoking status: Former Rollins -- 1.00 packs/day  for 25 years    Types: Cigarettes    Quit date: 07/21/1984  . Smokeless tobacco: Never Used  . Alcohol Use: No  . Drug Use: No  . Sexual Activity: Not on file   Other Topics Concern  . Not on file   Social History Narrative  . No narrative on file     Current Outpatient Prescriptions on File Prior to Visit  Medication Sig Dispense Refill  . amiodarone (PACERONE) 200 MG tablet Take 1 tablet (200 mg total) by mouth daily.  30 tablet  6  . atenolol (TENORMIN) 50 MG tablet TAKE 1 TABLET (50 MG TOTAL) BY MOUTH 2 (TWO) TIMES DAILY.  180 tablet  3  . atorvastatin (LIPITOR) 20 MG tablet Take 1 tablet (20 mg total) by mouth daily.  90 tablet  1  . budesonide-formoterol (SYMBICORT) 80-4.5 MCG/ACT inhaler Inhale 2 puffs into the lungs 2 (two) times daily.  1 Inhaler  12  . cloNIDine (CATAPRES) 0.1 MG tablet Take 1 tablet (0.1 mg total) by mouth 2 (two) times daily.  180 tablet  3  . esomeprazole (NEXIUM) 40 MG capsule Take 40 mg by mouth daily before breakfast.      . glucose blood test strip Use three times a day as instructed  150 each  5  . HUMALOG 100 UNIT/ML injection INJECT 24 UNITS AT BREAKFAST, 22 UNITS AT LUNCH & 14 UNITS AT SUPPER  30 mL  3  . hyoscyamine (LEVBID) 0.375 MG 12 hr tablet Take 1 tablet (0.375 mg total) by mouth 2 (two) times daily.  60 tablet  1  . insulin glargine (LANTUS) 100 UNIT/ML injection TAKE 26 UNITS IN THE AM AND 35 UNITS IN THE PM.      . levalbuterol (XOPENEX HFA) 45 MCG/ACT inhaler Inhale 2 puffs into the lungs every 4 (four) hours as needed for wheezing.  1 Inhaler  5  . LYRICA 75 MG capsule TAKE ONE CAPSULE BY MOUTH TWICE A DAY  60 capsule  2  . Multiple Vitamin (MULTIVITAMIN) tablet Take 1 tablet by mouth daily.      . potassium chloride SA (K-DUR,KLOR-CON) 20 MEQ tablet Takes 1/2 tablet am and 1/2 tablet pm daily.      . sitaGLIPtin (JANUVIA) 100 MG tablet TAKE 1 TABLET BY MOUTH ONCE A DAY  30 tablet  5  . Tamsulosin HCl (FLOMAX) 0.4 MG CAPS Take 0.4 mg  by mouth daily.       Marland Kitchen torsemide (DEMADEX) 20 MG tablet Take 2 tablets (40 mg total) by mouth 2 (two) times daily.  120 tablet  6   No current facility-administered medications on file prior to visit.     Review of Systems  Constitutional: Negative for appetite change and fatigue.  HENT: Negative.   Eyes: Negative.   Respiratory: Positive for shortness of breath (history of COPD). Negative for chest tightness  and wheezing.   Cardiovascular: Positive for leg swelling (ongoing problem).  Gastrointestinal: Positive for constipation (takes fiber, miralax) and blood in stool (with bm several days ago. Hx of hemorrhoids).  Endocrine: Negative.   Genitourinary: Negative.   Musculoskeletal: Positive for arthralgias and gait problem (obesity hinders walking).  Skin: Negative.   Neurological: Negative.   Psychiatric/Behavioral: Negative.        Objective:  BP 140/72  Pulse 56  Temp(Src) 98.3 F (36.8 C) (Oral)  Resp 18  Ht 6' 0.5" (1.842 m)  Wt 302 lb 8 oz (137.213 kg)  BMI 40.44 kg/m2  SpO2 95%   Physical Exam  Constitutional: He is oriented to person, place, and time. No distress.  Obese, pleasant 79 y/o male  HENT:  Head: Normocephalic and atraumatic.  Right Ear: External ear normal.  Left Ear: External ear normal.  Nose: Nose normal.  Mouth/Throat: Oropharynx is clear and moist.  Eyes: Conjunctivae and EOM are normal. Pupils are equal, round, and reactive to light.  Neck: Normal range of motion. Neck supple. No tracheal deviation present. No thyromegaly present.  Cardiovascular: Normal rate, regular rhythm, normal heart sounds and intact distal pulses.  Exam reveals no gallop and no friction rub.   No murmur heard. Pulmonary/Chest: Breath sounds normal. No respiratory distress. He has no wheezes. He has no rales.  Use of accessory muscles to aid breathing especially with activity  Abdominal: Soft. Bowel sounds are normal. He exhibits distension. There is no tenderness.    Large, rounded abdomen. Non tender  Musculoskeletal: He exhibits edema. He exhibits no tenderness.  Ambulates with cane. Slow ambulation 2/2 arthralgias and hx of COPD  Lymphadenopathy:    He has no cervical adenopathy.  Neurological: He is alert and oriented to person, place, and time. He has normal reflexes. No cranial nerve deficit. Coordination normal.  Skin: Skin is warm and dry.  Psychiatric: He has a normal mood and affect. His behavior is normal. Judgment and thought content normal.      Assessment & Plan:   1. Medicare annual wellness visit, subsequent Annual comprehensive exam was done.  All screenings have been addressed with patient. Prescription for shingles sent to his pharmacy for administration.  2. Screen for colon cancer Check stool for blood. Reports incident of blood with BM several days ago. Hx of hemorrhoids - Fecal occult blood, imunochemical  3. Diabetes Check A1c. Refer for diabetic eye exam - Hemoglobin A1c; Future - Ambulatory referral to Ophthalmology - Hemoglobin A1c

## 2013-12-28 NOTE — Telephone Encounter (Signed)
Sent mychart message

## 2013-12-29 ENCOUNTER — Encounter: Payer: Self-pay | Admitting: Adult Health

## 2013-12-30 ENCOUNTER — Other Ambulatory Visit: Payer: Self-pay | Admitting: *Deleted

## 2013-12-30 MED ORDER — HYOSCYAMINE SULFATE ER 0.375 MG PO TB12: 0.3750 mg | ORAL_TABLET | Freq: Two times a day (BID) | ORAL | Status: DC

## 2014-01-01 ENCOUNTER — Telehealth: Payer: Self-pay | Admitting: Physician Assistant

## 2014-01-01 NOTE — Telephone Encounter (Signed)
Alonza SmokerWilliam Nesbit is a 79 y.o. male with a hx of CKD, diastolic CHF, AFib. HHRN urged him to call.   Weight gain over last week=7 lbs. He notes increased abdominal girth. LE edema unchanged. He notes slightly worsening dyspnea. He has chronic orthopnea without change.  No PND. No chest pain. I advised him to increase Torsemide to 40 mg bid and to call the office tomorrow. He may need earlier follow up this week and a BMET. He knows to go to the ED if he feels worse. Tereso NewcomerScott Divine Hansley, PA-C   01/01/2014 1:16 PM

## 2014-01-01 NOTE — Telephone Encounter (Signed)
Can we call to follow up in the next few days May need appt

## 2014-01-02 ENCOUNTER — Telehealth: Payer: Self-pay | Admitting: Adult Health

## 2014-01-02 ENCOUNTER — Telehealth: Payer: Self-pay | Admitting: *Deleted

## 2014-01-02 NOTE — Telephone Encounter (Signed)
Left message w/ pt's wife to have him call us back.

## 2014-01-02 NOTE — Telephone Encounter (Signed)
Spoke w/ pt. He is sched to come in 01/05/14 @ 2:30 to see Dr. Gollan.  

## 2014-01-02 NOTE — Telephone Encounter (Signed)
Spoke with patient and notified him of Raquel's comments. Patient verbalized understanding. Patient stated he saw the MyChart message and its ok to continue using Mychart to communicate with him.

## 2014-01-02 NOTE — Telephone Encounter (Signed)
Pt has not read last message. Do we need to communicate in another way with pt?

## 2014-01-02 NOTE — Telephone Encounter (Signed)
Attempted to contact pt.   Line busy. Will reattempt.

## 2014-01-02 NOTE — Telephone Encounter (Signed)
[employer] Vickie received a fax stating patient has symptoms of  Extreme dizziness, sob and swelling feet and ankles. Also having constitpation. PA advised him to double toursamide two at night and two in morning. Please call patient.

## 2014-01-02 NOTE — Telephone Encounter (Signed)
Spoke w/ pt. He is sched to come in 01/05/14 @ 2:30 to see Dr. Mariah MillingGollan.

## 2014-01-05 ENCOUNTER — Encounter: Payer: Self-pay | Admitting: Cardiovascular Disease

## 2014-01-05 ENCOUNTER — Ambulatory Visit (INDEPENDENT_AMBULATORY_CARE_PROVIDER_SITE_OTHER): Payer: Medicare Other | Admitting: Cardiovascular Disease

## 2014-01-05 VITALS — BP 130/72 | HR 54 | Ht 73.0 in | Wt 305.8 lb

## 2014-01-05 DIAGNOSIS — R609 Edema, unspecified: Secondary | ICD-10-CM

## 2014-01-05 DIAGNOSIS — I5032 Chronic diastolic (congestive) heart failure: Secondary | ICD-10-CM

## 2014-01-05 DIAGNOSIS — E1329 Other specified diabetes mellitus with other diabetic kidney complication: Secondary | ICD-10-CM

## 2014-01-05 DIAGNOSIS — N289 Disorder of kidney and ureter, unspecified: Secondary | ICD-10-CM

## 2014-01-05 DIAGNOSIS — R600 Localized edema: Secondary | ICD-10-CM

## 2014-01-05 DIAGNOSIS — I4891 Unspecified atrial fibrillation: Secondary | ICD-10-CM

## 2014-01-05 DIAGNOSIS — N058 Unspecified nephritic syndrome with other morphologic changes: Secondary | ICD-10-CM

## 2014-01-05 DIAGNOSIS — R0602 Shortness of breath: Secondary | ICD-10-CM

## 2014-01-05 DIAGNOSIS — I10 Essential (primary) hypertension: Secondary | ICD-10-CM

## 2014-01-05 DIAGNOSIS — I480 Paroxysmal atrial fibrillation: Secondary | ICD-10-CM

## 2014-01-05 DIAGNOSIS — E0821 Diabetes mellitus due to underlying condition with diabetic nephropathy: Secondary | ICD-10-CM

## 2014-01-05 NOTE — Patient Instructions (Signed)
You are doing well. No medication changes were made.  Consider wearing the compression hose  Please call if you have significant shortness of breath episodes, We would call in an emergency pill  Please call us if you have new issues that need to be addressed before your next appt.  Your physician wants you to follow-up in: 6 months.  You will receive a reminder letter in the mail two months in advance. If you don't receive a letter, please call our office to schedule the follow-up appointment.

## 2014-01-05 NOTE — Assessment & Plan Note (Signed)
We have encouraged continued exercise, careful diet management in an effort to lose weight. 

## 2014-01-05 NOTE — Progress Notes (Signed)
Patient ID: [name], male    DOB: 1935-03-17, 79 y.o.   MRN: [id number]  HPI Comments: 79 year old male with past history of morbid obesity, hypertension, hypercholesterolemia, diabetes with renal insufficiency, thrombocytopenia patient of, Dr. Lorin PicketScott,  episode of atrial fibrillation in early 2014.  history of COPD, obstructive sleep apnea, on CPAP.  smoked for 25 years, stopped 30 years ago. On his initial clinic visit, he was started on rate control medications for atrial fibrillation  and diuretic for significant lower extremity edema and shortness of breath. Also started on anticoagulation.  elective cardioversion on October 27 2012 which was successful.   In followup today, he reports having shortness of breath with exertion, significant leg swelling. Weight continues to be a major issue. He does not do any regular exercise program. Weight is relatively unchanged from his prior clinic visit. He states that he feels worn out when he tries to do anything Recent hemoglobin A1c 7.1  recent creatinine 2.0, typically baseline creatinine 1.5 up to 1.8 His legs are down most of the day. He does not wear compression hose He blames the leg swelling on Lyrica. Reports this medication caused 50 pound weight gain   in the hospital 06/26/2013 for shortness of breath BNP 4500. He had aggressive diuresis and was -11 L through his hospital course,  ejection fraction 55-60%, creatinine 2.1 but went as high as 2.4, hemoglobin A1c 7.2. He was in normal sinus rhythm during his hospital course   History of  severe neck pain. He reports having an episode of neck pain previously that seemed to resolve after an MRI. Then he developed hip pain and was seeking care from an orthopedic specialist.  He reports being compliant with his CPAP  Echocardiogram  shows normal ejection fraction with moderately dilated left atrium, high normal right ventricular systolic pressure   EKG today shows normal sinus rhythm with rate  54 beats per minute, right bundle branch block, no other significant ST or T wave changes Reports usual heart rate is in the 40s and he has no symptoms  Past Medical History . Arthritis  . 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 Sleep apnea  . COPD (chronic obstructive pulmonary disease)  . Hypercholesterolemia  . Hypertension  . Thrombocytopenia  . Renal insufficiency  . Diverticulosis  . History of colon polyps    Outpatient Encounter Prescriptions as of 01/05/2014  Medication Sig  . amiodarone (PACERONE) 200 MG tablet Take 1 tablet (200 mg total) by mouth daily.  Marland Kitchen. atenolol (TENORMIN) 50 MG tablet TAKE 1 TABLET (50 MG TOTAL) BY MOUTH 2 (TWO) TIMES DAILY.  Marland Kitchen. atorvastatin (LIPITOR) 20 MG tablet Take 1 tablet (20 mg total) by mouth daily.  . budesonide-formoterol (SYMBICORT) 80-4.5 MCG/ACT inhaler Inhale 2 puffs into the lungs 2 (two) times daily.  . cloNIDine (CATAPRES) 0.1 MG tablet Take 1 tablet (0.1 mg total) by mouth 2 (two) times daily.  Marland Kitchen. escitalopram (LEXAPRO) 10 MG tablet TAKE 1/2 TABLET BY MOUTH ONCE A DAY  . esomeprazole (NEXIUM) 40 MG capsule Take 40 mg by mouth daily before breakfast.  . glucose blood test strip Use three times a day as instructed  . HUMALOG 100 UNIT/ML injection INJECT 24 UNITS AT BREAKFAST, 22 UNITS AT LUNCH & 14 UNITS AT SUPPER  . hyoscyamine (LEVBID) 0.375 MG 12 hr tablet Take 1 tablet (0.375 mg total) by mouth 2 (two) times daily.  . insulin glargine (LANTUS) 100 UNIT/ML injection TAKE  26 UNITS IN THE AM AND 35 UNITS IN THE PM.  . levalbuterol (XOPENEX HFA) 45 MCG/ACT inhaler Inhale 2 puffs into the lungs every 4 (four) hours as needed for wheezing.  Marland Kitchen. LYRICA 75 MG capsule TAKE ONE CAPSULE BY MOUTH TWICE A DAY  . Multiple Vitamin (MULTIVITAMIN) tablet Take 1 tablet by mouth daily.  . potassium chloride SA (K-DUR,KLOR-CON) 20 MEQ tablet Takes 1/2 tablet am and 1/2 tablet pm daily.  . sitaGLIPtin (JANUVIA) 100 MG tablet TAKE 1  TABLET BY MOUTH ONCE A DAY  . Tamsulosin HCl (FLOMAX) 0.4 MG CAPS Take 0.4 mg by mouth daily.   Marland Kitchen. torsemide (DEMADEX) 20 MG tablet Take 2 tablets (40 mg total) by mouth 2 (two) times daily.  Marland Kitchen. zoster vaccine live, PF, (ZOSTAVAX) [id number] UNT/0.65ML injection Inject 19,400 Units into the skin once.    Review of Systems  Constitutional: Negative.   HENT: Negative.   Eyes: Negative.   Respiratory: Positive for shortness of breath.   Cardiovascular: Positive for leg swelling.  Gastrointestinal: Negative.   Endocrine: Negative.   Musculoskeletal: Negative.   Skin: Negative.   Allergic/Immunologic: Negative.   Neurological: Negative.   Hematological: Negative.   Psychiatric/Behavioral: Negative.   All other systems reviewed and are negative.   BP 130/72  Pulse 54  Ht 6\' 1"  (1.854 m)  Wt 305 lb 12 oz (138.687 kg)  BMI 40.35 kg/m2  Physical Exam  Nursing note and vitals reviewed. Constitutional: He is oriented to person, place, and time. He appears well-developed and well-nourished.  Morbidly obese  HENT:  Head: Normocephalic.  Nose: Nose normal.  Mouth/Throat: Oropharynx is clear and moist.  Eyes: Conjunctivae are normal. Pupils are equal, round, and reactive to light.  Neck: Normal range of motion. Neck supple. No JVD present.  Cardiovascular: Normal rate, S1 normal, S2 normal, normal heart sounds and intact distal pulses.  Exam reveals no gallop and no friction rub.   No murmur heard. 1 to 2+ pitting edema to below the knees  Pulmonary/Chest: Effort normal. No respiratory distress. He has no wheezes. He exhibits no tenderness.  Abdominal: Soft. Bowel sounds are normal. He exhibits no distension. There is no tenderness.  Musculoskeletal: Normal range of motion. He exhibits no edema and no tenderness.  Lymphadenopathy:    He has no cervical adenopathy.  Neurological: He is alert and oriented to person, place, and time. Coordination normal.  Skin: Skin is warm and dry. No rash  noted. No erythema.  Psychiatric: He has a normal mood and affect. His behavior is normal. Judgment and thought content normal.      Assessment and Plan

## 2014-01-05 NOTE — Assessment & Plan Note (Signed)
Shortness of breath is likely multifactorial including deconditioning, obesity. Elevated creatinine suggests prerenal state of symptoms get worse may need to repeat echocardiogram, do a short trial of metolazone with torsemide

## 2014-01-05 NOTE — Assessment & Plan Note (Signed)
Blood pressure is well controlled on today's visit. No changes made to the medications. 

## 2014-01-05 NOTE — Assessment & Plan Note (Signed)
Long history of diabetes likely contributing to his renal insufficiency. We'll need to avoid overdiuresis

## 2014-01-05 NOTE — Assessment & Plan Note (Signed)
Recommended compression hose and leg elevation. Could consider lymphedema boots if symptoms get worse

## 2014-01-05 NOTE — Assessment & Plan Note (Signed)
He is maintaining normal sinus rhythm. No changes to his medications 

## 2014-01-05 NOTE — Assessment & Plan Note (Signed)
With creatinine running greater than 2, on torsemide twice a day, we suggested he continue on his current medication and not increase his diuretic regimen. I suspect his shortness of breath is likely from deconditioning, obesity and COPD. Lower extremity edema possibly from venous insufficiency. Given no dramatic improvement with diuresis and worsening renal failure, additional diuretics would likely not help with his edema. Recommended leg elevation, compression hose.

## 2014-01-15 ENCOUNTER — Other Ambulatory Visit: Payer: Self-pay | Admitting: Cardiovascular Disease

## 2014-01-17 ENCOUNTER — Observation Stay: Payer: Self-pay | Admitting: Internal Medicine

## 2014-01-17 ENCOUNTER — Telehealth: Payer: Self-pay

## 2014-01-17 LAB — BASIC METABOLIC PANEL
ANION GAP: 6 — AB (ref 7–16)
BUN: 21 mg/dL — AB (ref 7–18)
CHLORIDE: 102 mmol/L (ref 98–107)
Calcium, Total: 8 mg/dL — ABNORMAL LOW (ref 8.5–10.1)
Co2: 28 mmol/L (ref 21–32)
Creatinine: 2.15 mg/dL — ABNORMAL HIGH (ref 0.60–1.30)
EGFR (African American): 33 — ABNORMAL LOW
EGFR (Non-African Amer.): 29 — ABNORMAL LOW
Glucose: 186 mg/dL — ABNORMAL HIGH (ref 65–99)
OSMOLALITY: 280 (ref 275–301)
Potassium: 4 mmol/L (ref 3.5–5.1)
Sodium: 136 mmol/L (ref 136–145)

## 2014-01-17 LAB — CBC
HCT: 28.3 % — ABNORMAL LOW (ref 40.0–52.0)
HGB: 9.4 g/dL — ABNORMAL LOW (ref 13.0–18.0)
MCH: 31.2 pg (ref 26.0–34.0)
MCHC: 33.1 g/dL (ref 32.0–36.0)
MCV: 94 fL (ref 80–100)
Platelet: 100 10*3/uL — ABNORMAL LOW (ref 150–440)
RBC: 3.01 10*6/uL — ABNORMAL LOW (ref 4.40–5.90)
RDW: 14.9 % — AB (ref 11.5–14.5)
WBC: 6.7 10*3/uL (ref 3.8–10.6)

## 2014-01-17 LAB — CK TOTAL AND CKMB (NOT AT ARMC)
CK, Total: 179 U/L
CK-MB: 3 ng/mL (ref 0.5–3.6)

## 2014-01-17 LAB — TROPONIN I: Troponin-I: <0.02 ng/mL

## 2014-01-17 LAB — PRO B NATRIURETIC PEPTIDE: B-TYPE NATIURETIC PEPTID: 548 pg/mL — AB (ref 0–450)

## 2014-01-17 NOTE — Telephone Encounter (Signed)
Pt states he has had "chest pain" in shoulders, and back, not sure if it is heart related or not, states it has moved from the center of his neck, to his back, and now to his chest. Please call.

## 2014-01-17 NOTE — Telephone Encounter (Signed)
Spoke w/ pt.  He reports left sided chest pain for the past 2 days that moves from his chest to his arms, across his shoulders and feels like "hell on wheels". Denies SOB, nausea or sweating. Reports pain occurs when he is lying down, watching TV, up in the kitchen or going to bed. Reports that pain lasts a second or two, is 10/10, relieved when he finds a comfortable position.  When he presses on his chest, he has soreness in the area that was hurting.  Pt has been on muscle relaxer for some time for "soreness around my neck", but has been out of this med for the past week. Advised pt to contact PCP, as what he is describing sounds muscular in nature, but to call 911 if he feels that his sx worsen or if accompanied by SOB, nausea or sweating.  He is agreeable and will call Dr. Lorin PicketScott.  Pt to call back w/ any questions or concerns.

## 2014-01-17 NOTE — Telephone Encounter (Signed)
Patient Information:  Caller Name: Chrissie NoaWilliam  Phone: [phone]  Patient: [name], [name]  Gender: Male  DOB: [birth year]  Age: [age] Years  PCP: Dale DurhamScott, Charlene  Office Follow Up:  Does the office need to follow up with this patient?: Yes  Instructions For The Office: FYI instructed to dial 911, chest pain, he agreed.  RN Note:  Afebrile. Onset of chest pain yesterday, 01/16/2014 and getting worse. Today, 01/17/2014 radiated from left side to the right. RN/CAN triaged and advised 911, offered to dial for him. He will call.  Symptoms  Reason For Call & Symptoms: chest pain: left side arm and radiated to back  Reviewed Health History In EMR: Yes  Reviewed Medications In EMR: Yes  Reviewed Allergies In EMR: Yes  Reviewed Surgeries / Procedures: Yes  Date of Onset of Symptoms: 01/16/2014  Treatments Tried: Oxycodone (1) and muscle relaxer  Treatments Tried Worked: No  Guideline(s) Used:  Chest Pain  Disposition Per Guideline:   Call EMS 911 Now  Reason For Disposition Reached:   Chest pain lasting longer than 5 minutes and ANY of the following:  Over 79 years old Over 412 years old and at least one cardiac risk factor (i.e., high blood pressure, diabetes, high cholesterol, obesity, smoker or strong family history of heart disease) Pain is crushing, pressure-like, or heavy  Took nitroglycerin and chest pain was not relieved History of heart disease (i.e., angina, heart attack, bypass surgery, angioplasty, CHF)  Advice Given:  Fleeting Chest Pain:  Fleeting chest pains that last only a few seconds and then go away are generally not serious. They may be from pinched muscles or nerves in your chest wall.  Call Back If:  Severe chest pain  Constant chest pain lasting longer than 5 minutes  Difficulty breathing  Fever  You become worse.  Patient Will Follow Care Advice:  YES

## 2014-01-17 NOTE — Telephone Encounter (Signed)
Agree with ER evaluation.  Please call and make sure pt went to ER.  Thanks.

## 2014-01-18 ENCOUNTER — Other Ambulatory Visit: Payer: Self-pay | Admitting: *Deleted

## 2014-01-18 DIAGNOSIS — I4891 Unspecified atrial fibrillation: Secondary | ICD-10-CM

## 2014-01-18 DIAGNOSIS — I10 Essential (primary) hypertension: Secondary | ICD-10-CM

## 2014-01-18 DIAGNOSIS — R0789 Other chest pain: Secondary | ICD-10-CM

## 2014-01-18 DIAGNOSIS — I5032 Chronic diastolic (congestive) heart failure: Secondary | ICD-10-CM

## 2014-01-18 LAB — LIPID PANEL
Cholesterol: 148 mg/dL (ref 0–200)
HDL: 37 mg/dL — AB (ref 40–60)
Ldl Cholesterol, Calc: 86 mg/dL (ref 0–100)
Triglycerides: 127 mg/dL (ref 0–200)
VLDL Cholesterol, Calc: 25 mg/dL (ref 5–40)

## 2014-01-18 LAB — CBC WITH DIFFERENTIAL/PLATELET
Basophil #: 0 10*3/uL (ref 0.0–0.1)
Basophil %: 0.3 %
EOS ABS: 0.1 10*3/uL (ref 0.0–0.7)
Eosinophil %: 0.8 %
HCT: 28.1 % — ABNORMAL LOW (ref 40.0–52.0)
HGB: 9.6 g/dL — AB (ref 13.0–18.0)
LYMPHS ABS: 0.6 10*3/uL — AB (ref 1.0–3.6)
Lymphocyte %: 9.3 %
MCH: 31.6 pg (ref 26.0–34.0)
MCHC: 34.2 g/dL (ref 32.0–36.0)
MCV: 93 fL (ref 80–100)
MONO ABS: 2.2 x10 3/mm — AB (ref 0.2–1.0)
MONOS PCT: 32.9 %
NEUTROS ABS: 3.8 10*3/uL (ref 1.4–6.5)
Neutrophil %: 56.7 %
PLATELETS: 101 10*3/uL — AB (ref 150–440)
RBC: 3.04 10*6/uL — AB (ref 4.40–5.90)
RDW: 15 % — AB (ref 11.5–14.5)
WBC: 6.7 10*3/uL (ref 3.8–10.6)

## 2014-01-18 LAB — CK TOTAL AND CKMB (NOT AT ARMC)
CK, TOTAL: 206 U/L
CK-MB: 3.5 ng/mL (ref 0.5–3.6)

## 2014-01-18 LAB — BASIC METABOLIC PANEL
ANION GAP: 6 — AB (ref 7–16)
BUN: 25 mg/dL — ABNORMAL HIGH (ref 7–18)
CALCIUM: 8.4 mg/dL — AB (ref 8.5–10.1)
CHLORIDE: 102 mmol/L (ref 98–107)
CO2: 34 mmol/L — AB (ref 21–32)
CREATININE: 2.08 mg/dL — AB (ref 0.60–1.30)
EGFR (African American): 35 — ABNORMAL LOW
GFR CALC NON AF AMER: 30 — AB
Glucose: 122 mg/dL — ABNORMAL HIGH (ref 65–99)
Osmolality: 289 (ref 275–301)
Potassium: 3.4 mmol/L — ABNORMAL LOW (ref 3.5–5.1)
Sodium: 142 mmol/L (ref 136–145)

## 2014-01-18 LAB — TROPONIN I: Troponin-I: <0.02 ng/mL

## 2014-01-18 LAB — MAGNESIUM: Magnesium: 1.6 mg/dL — ABNORMAL LOW

## 2014-01-18 LAB — CK-MB: CK-MB: 2.6 ng/mL (ref 0.5–3.6)

## 2014-01-18 LAB — TSH: THYROID STIMULATING HORM: 5.33 u[IU]/mL — AB

## 2014-01-18 MED ORDER — TORSEMIDE 20 MG PO TABS: ORAL_TABLET | ORAL | Status: DC

## 2014-01-18 NOTE — Telephone Encounter (Signed)
Requested Prescriptions   Signed Prescriptions Disp Refills  . torsemide (DEMADEX) 20 MG tablet 90 tablet 3    Sig: Takes 2 tablets at AM and 1 tablet at PM daily.    Authorizing Provider: Antonieta IbaGOLLAN, TIMOTHY J    Ordering User: Kendrick FriesLOPEZ, Maryhelen Lindler C

## 2014-01-18 NOTE — Telephone Encounter (Signed)
Pt went to ER & records requested

## 2014-01-19 NOTE — Telephone Encounter (Signed)
Atypical chest pain Had a stress test in the hospital which was normal This was discussed with him

## 2014-01-30 ENCOUNTER — Telehealth: Payer: Self-pay | Admitting: *Deleted

## 2014-01-30 ENCOUNTER — Other Ambulatory Visit: Payer: Self-pay | Admitting: Internal Medicine

## 2014-01-30 NOTE — Telephone Encounter (Signed)
[place] [employer] medical records release sent to [facility].

## 2014-02-02 ENCOUNTER — Other Ambulatory Visit: Payer: Self-pay | Admitting: Internal Medicine

## 2014-02-09 ENCOUNTER — Telehealth: Payer: Self-pay | Admitting: Internal Medicine

## 2014-02-09 NOTE — Telephone Encounter (Signed)
Pt called in and stated [employer] will be sending Dr.Scott some paperwork to sign for sleep apnea supplies. Just needs filled out and sent back in.

## 2014-02-09 NOTE — Telephone Encounter (Signed)
The form requires a signed and completed copy of a sleep study.  Has he had one recently. (he also sees pulmonary).   They may have ordered.  Will need copy of sleep study to send with form (according to their form).

## 2014-02-09 NOTE — Telephone Encounter (Signed)
Given to Dr. Scott for signature 

## 2014-02-13 ENCOUNTER — Encounter: Payer: Self-pay | Admitting: *Deleted

## 2014-02-13 NOTE — Telephone Encounter (Signed)
Sent mychart message

## 2014-03-02 ENCOUNTER — Other Ambulatory Visit: Payer: Self-pay | Admitting: Internal Medicine

## 2014-03-03 ENCOUNTER — Other Ambulatory Visit: Payer: Self-pay | Admitting: Internal Medicine

## 2014-03-03 ENCOUNTER — Other Ambulatory Visit: Payer: Self-pay | Admitting: *Deleted

## 2014-03-03 MED ORDER — PREGABALIN 75 MG PO CAPS: ORAL_CAPSULE | ORAL | Status: DC

## 2014-03-03 NOTE — Telephone Encounter (Signed)
Were we the ones that filled this last.  If so, then ok x 2.

## 2014-03-03 NOTE — Telephone Encounter (Signed)
Last refill 7.13.15, last OV 6.10.15, next OV 9.13.15.  Please advise refill.

## 2014-03-03 NOTE — Telephone Encounter (Signed)
Yes, we filled it last on 01/30/14. Refill faxed to pharmacy & pt notified

## 2014-03-22 ENCOUNTER — Telehealth: Payer: Self-pay

## 2014-03-22 NOTE — Telephone Encounter (Signed)
Nurse with [facility] states pt feels SOB, swelling more than normal, states pt scratched his leg and oozing clear fluid. Pt had to sit upright last night. Pt does not feel like he should go to ED. She would like [place] to contact pt.

## 2014-03-22 NOTE — Telephone Encounter (Signed)
Spoke with patient per [facility] nurse request  Patient stated he was short of breath  He stated his legs were swelling and oozed clear fluid when he scratched them  He stated he has been this way for weeks  I urged him to visit the ED or urgent care  Patient refused  He stated he would come to clinic  I explained that we had nothing available until Friday 03/24/14 Patient stated he will be fine to wait until then    I discussed his symptoms with Dr. Herbie Baltimore  He recommends patient take extra Demadex once daily today and tomorrow  Patient verbalized understanding   Instructed patient to contact EMS if his situation becomes emergent

## 2014-03-24 ENCOUNTER — Inpatient Hospital Stay: Payer: Self-pay | Admitting: Internal Medicine

## 2014-03-24 ENCOUNTER — Ambulatory Visit (INDEPENDENT_AMBULATORY_CARE_PROVIDER_SITE_OTHER): Payer: Medicare Other | Admitting: Nurse Practitioner

## 2014-03-24 ENCOUNTER — Encounter: Payer: Self-pay | Admitting: Nurse Practitioner

## 2014-03-24 VITALS — BP 154/64 | HR 60 | Ht 73.0 in | Wt 315.0 lb

## 2014-03-24 DIAGNOSIS — I509 Heart failure, unspecified: Secondary | ICD-10-CM

## 2014-03-24 DIAGNOSIS — N183 Chronic kidney disease, stage 3 unspecified: Secondary | ICD-10-CM

## 2014-03-24 DIAGNOSIS — I10 Essential (primary) hypertension: Secondary | ICD-10-CM

## 2014-03-24 DIAGNOSIS — I4891 Unspecified atrial fibrillation: Secondary | ICD-10-CM

## 2014-03-24 DIAGNOSIS — I5033 Acute on chronic diastolic (congestive) heart failure: Secondary | ICD-10-CM

## 2014-03-24 DIAGNOSIS — I480 Paroxysmal atrial fibrillation: Secondary | ICD-10-CM

## 2014-03-24 DIAGNOSIS — R079 Chest pain, unspecified: Secondary | ICD-10-CM

## 2014-03-24 LAB — COMPREHENSIVE METABOLIC PANEL
ALBUMIN: 3 g/dL — AB (ref 3.4–5.0)
ALK PHOS: 111 U/L
ANION GAP: 4 — AB (ref 7–16)
AST: 19 U/L (ref 15–37)
BUN: 23 mg/dL — ABNORMAL HIGH (ref 7–18)
Bilirubin,Total: 0.5 mg/dL (ref 0.2–1.0)
CHLORIDE: 108 mmol/L — AB (ref 98–107)
CREATININE: 2.06 mg/dL — AB (ref 0.60–1.30)
Calcium, Total: 8.3 mg/dL — ABNORMAL LOW (ref 8.5–10.1)
Co2: 33 mmol/L — ABNORMAL HIGH (ref 21–32)
EGFR (African American): 35 — ABNORMAL LOW
EGFR (Non-African Amer.): 30 — ABNORMAL LOW
Glucose: 61 mg/dL — ABNORMAL LOW (ref 65–99)
Osmolality: 290 (ref 275–301)
POTASSIUM: 3.9 mmol/L (ref 3.5–5.1)
SGPT (ALT): 16 U/L
Sodium: 145 mmol/L (ref 136–145)
Total Protein: 6.4 g/dL (ref 6.4–8.2)

## 2014-03-24 LAB — CBC WITH DIFFERENTIAL/PLATELET
Basophil #: 0 10*3/uL (ref 0.0–0.1)
Basophil %: 0.2 %
Eosinophil #: 0 10*3/uL (ref 0.0–0.7)
Eosinophil %: 0.5 %
HCT: 27.3 % — ABNORMAL LOW (ref 40.0–52.0)
HGB: 8.9 g/dL — ABNORMAL LOW (ref 13.0–18.0)
LYMPHS ABS: 0.5 10*3/uL — AB (ref 1.0–3.6)
LYMPHS PCT: 6.8 %
MCH: 30.2 pg (ref 26.0–34.0)
MCHC: 32.8 g/dL (ref 32.0–36.0)
MCV: 92 fL (ref 80–100)
MONO ABS: 2.3 x10 3/mm — AB (ref 0.2–1.0)
Monocyte %: 28.8 %
NEUTROS PCT: 63.7 %
Neutrophil #: 5 10*3/uL (ref 1.4–6.5)
Platelet: 111 10*3/uL — ABNORMAL LOW (ref 150–440)
RBC: 2.95 10*6/uL — AB (ref 4.40–5.90)
RDW: 14.8 % — ABNORMAL HIGH (ref 11.5–14.5)
WBC: 7.9 10*3/uL (ref 3.8–10.6)

## 2014-03-24 LAB — TROPONIN I
Troponin-I: <0.02 ng/mL
Troponin-I: <0.02 ng/mL

## 2014-03-24 LAB — CK TOTAL AND CKMB (NOT AT ARMC)
CK, TOTAL: 203 U/L
CK, Total: 188 U/L
CK-MB: 3.3 ng/mL (ref 0.5–3.6)
CK-MB: 3.8 ng/mL — ABNORMAL HIGH (ref 0.5–3.6)

## 2014-03-24 LAB — PRO B NATRIURETIC PEPTIDE: B-Type Natriuretic Peptide: 1388 pg/mL — ABNORMAL HIGH (ref 0–450)

## 2014-03-24 NOTE — Patient Instructions (Signed)
Please report to the registration desk at [facility] as soon as possible for direct admission

## 2014-03-24 NOTE — Progress Notes (Signed)
Patient Name: [name] Date of Encounter: 03/24/2014  Primary Care Provider:  Charm Barges, MD Primary Cardiologist:  Concha Se, MD   Patient Profile  79 y/o male with a h/o diastolic chf, paf, and ckd iii, who presents today with a 1 month h/o progressive volume overload and dyspnea.  Problem List   Past Medical History  Diagnosis Date  . Arthritis   . Sleep apnea     a. On CPAP; b. Severe OSA by sleep study 01/2014.  Marland Kitchen COPD (chronic obstructive pulmonary disease)   . Hypercholesterolemia   . Hypertension   . Thrombocytopenia   . CKD (chronic kidney disease), stage III   . Diverticulosis   . History of colon polyps   . Type II diabetes mellitus   . Chronic diastolic CHF (congestive heart failure), NYHA class 3     a. 06/2013 Echo: EF 55-60%, mild conc LVH, mildly dil LA/RA, mild-mod Ao sclerosis w/o stenosis.  . Chest pain     a. 01/2013 Stress only myoview: low risk->Med Rx.  Marland Kitchen PAF (paroxysmal atrial fibrillation)     a. On amio.  Previously on Xarelto but self discontinued 2/2 concern r/t swelling.   Past Surgical History  Procedure Laterality Date  . Cataract extraction      Left eye  . Back surgery  2000  . Trigger finger release Left   . Colonoscopy    . Cardioversion    . Medial partial knee replacement  06/2013    right    Allergies  Allergies  Allergen Reactions  . Nsaids     Other reaction(s): Other (See Comments) Renal insufficiency  . Oxycodone   . Tetanus Immune Globulin     Other reaction(s): UNKNOWN  . Tramadol     HPI  79 y/o male with the above complex problem list.  He has a h/o chronic diastolic chf in the setting of underlying htn, morbid obesity, and OSA. He was last admitted to [facility] in July for chest and shoulder pain.  He underwent stress testing during that admission, which was low-risk with nl LV fxn and he was discharged home.  Over the past month, he has been experiencing progressive bilateral lower ext edema, now  extending into thighs/scrotum, increasing abd girth, 10+ lb wt gain, DOE, and orthopnea.  He has been on torsemide  bid and called the office earlier this week to report his Ss.  He was advised to present to the ED for admission but refused and instead made an appt to be seen today.  Since Wednesday, he has been taking torsemide  bid, but has not noticed any significant change in his volume status.     Home Medications  Prior to Admission medications   Medication Sig Start Date End Date Taking? Authorizing Provider  amiodarone (PACERONE) 200 MG tablet Take 1 tablet (200 mg total) by mouth daily. 07/26/13  Yes Antonieta Iba, MD  atenolol (TENORMIN) 50 MG tablet TAKE 1 TABLET (50 MG TOTAL) BY MOUTH 2 (TWO) TIMES DAILY. 10/26/13  Yes Antonieta Iba, MD  atorvastatin (LIPITOR) 20 MG tablet Take 1 tablet (20 mg total) by mouth daily. 11/23/13  Yes Dale , MD  budesonide-formoterol (SYMBICORT) 80-4.5 MCG/ACT inhaler Inhale 2 puffs into the lungs 2 (two) times daily. 02/01/13  Yes Lupita Leash, MD  cloNIDine (CATAPRES) 0.1 MG tablet Take 1 tablet (0.1 mg total) by mouth 2 (two) times daily. 05/05/13  Yes Antonieta Iba, MD  escitalopram (LEXAPRO)  10 MG tablet TAKE 1/2 TABLET BY MOUTH ONCE A DAY 12/28/13  Yes Dale Herrick, MD  esomeprazole (NEXIUM) 40 MG capsule Take 40 mg by mouth daily before breakfast.   Yes Historical Provider, MD  glucose blood test strip Use three times a day as instructed 01/24/13  Yes Dale [place], MD  HUMALOG 100 UNIT/ML injection INJECT 24 UNITS AT BREAKFAST, 22 UNITS AT LUNCH & 14 UNITS AT SUPPER 02/02/14  Yes Sherlene Shams, MD  hyoscyamine (LEVBID) 0.375 MG 12 hr tablet Take 1 tablet (0.375 mg total) by mouth 2 (two) times daily. 12/30/13  Yes Dale [place], MD  insulin glargine (LANTUS) 100 UNIT/ML injection TAKE 26 UNITS IN THE AM AND 35 UNITS IN THE PM. 05/02/13  Yes Dale [place], MD  JANUVIA 100 MG tablet TAKE 1 TABLET BY MOUTH ONCE A DAY 01/30/14  Yes  Dale [place], MD  LANTUS 100 UNIT/ML injection TAKE 26 UNITS IN THE AM AND 46 UNITS IN THE PM. 03/03/14  Yes Dale [place], MD  levalbuterol [facility] HFA) 45 MCG/ACT inhaler Inhale 2 puffs into the lungs every 4 (four) hours as needed for wheezing. 10/08/12  Yes Dale [place], MD  Multiple Vitamin (MULTIVITAMIN) tablet Take 1 tablet by mouth daily.   Yes Historical Provider, MD  potassium chloride SA (K-DUR,KLOR-CON) 20 MEQ tablet Takes 1/2 tablet am and 1/2 tablet pm daily. 12/17/12  Yes Antonieta Iba, MD  pregabalin (LYRICA) 75 MG capsule TAKE ONE CAPSULE BY MOUTH TWICE A DAY 03/03/14  Yes Dale [place], MD  Tamsulosin HCl (FLOMAX) 0.4 MG CAPS Take 0.4 mg by mouth daily.  08/12/12  Yes Historical Provider, MD  torsemide (DEMADEX) 20 MG tablet Take 40 mg by mouth 2 (two) times daily.  01/18/14  Yes Antonieta Iba, MD  zoster vaccine live, PF, (ZOSTAVAX) 16109 UNT/0.65ML injection Inject 19,400 Units into the skin once. 12/28/13  Yes Raquel Conni Elliot, NP    Family History  Family History  Problem Relation Age of Onset  . Arthritis Mother   . Heart disease Mother   . Hypertension Mother   . Diabetes Mother   . Arthritis Father   . Cancer Father     prostate  . Diabetes Sister   . Heart disease Maternal Grandmother   . Diabetes Maternal Grandmother   . Heart disease Maternal Grandfather     Social History  History   Social History  . Marital Status: Married    Spouse Name: N/A    Number of Children: N/A  . Years of Education: N/A   Occupational History  . Not on file.   Social History Main Topics  . Smoking status: Former Smoker -- 1.00 packs/day for 25 years    Types: Cigarettes    Quit date: 07/21/1984  . Smokeless tobacco: Never Used  . Alcohol Use: No  . Drug Use: No  . Sexual Activity: Not on file   Other Topics Concern  . Not on file   Social History Narrative   Lives in [place] with his wife and dtr.  He does not routinely exercise.     Review of  Systems General:  No chills, fever, night sweats or weight changes.  Cardiovascular:  No chest pain, +++dyspnea on exertion, +++ edema, +++ orthopnea, no palpitations, +++ paroxysmal nocturnal dyspnea. Dermatological: No rash, lesions/masses Respiratory: +++ cough, +++ dyspnea Urologic: No hematuria, dysuria Abdominal:   +++ increasing abd girth.  No nausea, vomiting, diarrhea, bright red blood per rectum, melena, or hematemesis  Neurologic:  No visual changes, wkns, changes in mental status. All other systems reviewed and are otherwise negative except as noted above.  Physical Exam  Blood pressure 154/64, pulse 60, height  (1.854 m), weight 315 lb (142.883 kg).  General: Pleasant, NAD Psych: Normal affect. Neuro: Alert and oriented X 3. Moves all extremities spontaneously. HEENT: Normal  Neck: Supple without bruits.  JVP difficult to ascertain 2/2 girth.  Pt is not able to lie back for better assessment. Lungs:  Resp regular.  Speech is labored.  Bibasilar crackles. Heart: RRR no s3, s4, or murmurs. Abdomen: Soft, non-tender, non-distended, BS + x 4.  Extremities: No clubbing, cyanosis or edema. DP/PT/Radials 2+ and equal bilaterally.  Accessory Clinical Findings  ECG - RSR, 60, 1st deg AVB, RBBB. No acute st/t changes.  Assessment & Plan  1.  Acute on chronic diastolic CHF:  Pt presents today with a 1 month h/o progressive DOE, orthopnea, pnd, increasing abd girth, 10+ lb wt gain, and lower ext edema now extending into his thighs, buttocks, and scrotum.  He has not noted any significant relief in Ss or wt loss despite doubling up his outpt diuretics over the past 2.5 days.  He has significant volume overload on exam.  I have recommended inpatient admission for aggressive IV diuresis and close monitoring of renal function.  He is agreeable.  We have contacted bed control and also the admitting physician with the hospitalist team @ [facility] and have arranged for a direct admission today.   Recommend lasix  IV BID over the weekend and continuation of HR/BP home meds.  2. HTN:  BP modestly elevated in clinic today. Cont home doses of bb, clonidine.  Follow with aggressive diuresis.  3.  CKD III:  Baseline creat has more recently been trending around 2.  Follow with diuresis.  4.  Severe OSA:  Uses CPAP @ home.  5.  Morbid Obesity:  Would benefit from nutritional counseling.  Activity is pretty limited @ home.  6.  PAF:  In sinus and on amio and bb.  He is not currently on anticoagulation, having taken himself off of xarelto earlier this year b/c it was responsible for lower extremity swelling.  He may be willing to retry this and we can address this while hospitalized over the weekend.  Of note, he does have a h/o gastritis, anemia, and thrombocytopenia.  7.  H/O Chest Pain:  Non-ischemic MV in July.   Nicolasa Ducking, NP 03/24/2014, 3:29 PM

## 2014-03-24 NOTE — Progress Notes (Signed)
I have seen and examined the patient and agree with the H&P by Ward Givens, PA. He needs IV lasix and will be admitted to [facility].

## 2014-03-25 DIAGNOSIS — I509 Heart failure, unspecified: Secondary | ICD-10-CM

## 2014-03-25 DIAGNOSIS — I359 Nonrheumatic aortic valve disorder, unspecified: Secondary | ICD-10-CM

## 2014-03-25 DIAGNOSIS — I5031 Acute diastolic (congestive) heart failure: Secondary | ICD-10-CM

## 2014-03-25 LAB — BASIC METABOLIC PANEL
Anion Gap: 7 (ref 7–16)
BUN: 27 mg/dL — ABNORMAL HIGH (ref 7–18)
CALCIUM: 8.4 mg/dL — AB (ref 8.5–10.1)
CO2: 33 mmol/L — AB (ref 21–32)
Chloride: 107 mmol/L (ref 98–107)
Creatinine: 2.07 mg/dL — ABNORMAL HIGH (ref 0.60–1.30)
EGFR (Non-African Amer.): 30 — ABNORMAL LOW
GFR CALC AF AMER: 35 — AB
GLUCOSE: 116 mg/dL — AB (ref 65–99)
Osmolality: 299 (ref 275–301)
Potassium: 3.8 mmol/L (ref 3.5–5.1)
SODIUM: 147 mmol/L — AB (ref 136–145)

## 2014-03-25 LAB — CBC WITH DIFFERENTIAL/PLATELET
BASOS PCT: 0.9 %
Basophil #: 0.1 10*3/uL (ref 0.0–0.1)
Eosinophil #: 0 10*3/uL (ref 0.0–0.7)
Eosinophil %: 0.3 %
HCT: 27.4 % — ABNORMAL LOW (ref 40.0–52.0)
HGB: 8.9 g/dL — ABNORMAL LOW (ref 13.0–18.0)
LYMPHS PCT: 5.7 %
Lymphocyte #: 0.4 10*3/uL — ABNORMAL LOW (ref 1.0–3.6)
MCH: 30.2 pg (ref 26.0–34.0)
MCHC: 32.5 g/dL (ref 32.0–36.0)
MCV: 93 fL (ref 80–100)
Monocyte #: 2.3 x10 3/mm — ABNORMAL HIGH (ref 0.2–1.0)
Monocyte %: 30.2 %
NEUTROS PCT: 62.9 %
Neutrophil #: 4.9 10*3/uL (ref 1.4–6.5)
Platelet: 99 10*3/uL — ABNORMAL LOW (ref 150–440)
RBC: 2.95 10*6/uL — ABNORMAL LOW (ref 4.40–5.90)
RDW: 14.6 % — ABNORMAL HIGH (ref 11.5–14.5)
WBC: 7.7 10*3/uL (ref 3.8–10.6)

## 2014-03-25 LAB — TROPONIN I: Troponin-I: <0.02 ng/mL

## 2014-03-25 LAB — CK TOTAL AND CKMB (NOT AT ARMC)
CK, Total: 214 U/L
CK-MB: 4.2 ng/mL — AB (ref 0.5–3.6)

## 2014-03-25 LAB — PHOSPHORUS: PHOSPHORUS: 3.3 mg/dL (ref 2.5–4.9)

## 2014-03-26 LAB — BASIC METABOLIC PANEL
Anion Gap: 6 — ABNORMAL LOW (ref 7–16)
BUN: 32 mg/dL — ABNORMAL HIGH (ref 7–18)
CO2: 33 mmol/L — AB (ref 21–32)
Calcium, Total: 8 mg/dL — ABNORMAL LOW (ref 8.5–10.1)
Chloride: 105 mmol/L (ref 98–107)
Creatinine: 2.22 mg/dL — ABNORMAL HIGH (ref 0.60–1.30)
EGFR (African American): 32 — ABNORMAL LOW
EGFR (Non-African Amer.): 28 — ABNORMAL LOW
Glucose: 103 mg/dL — ABNORMAL HIGH (ref 65–99)
Osmolality: 294 (ref 275–301)
POTASSIUM: 3.8 mmol/L (ref 3.5–5.1)
SODIUM: 144 mmol/L (ref 136–145)

## 2014-03-26 LAB — SODIUM, URINE, RANDOM: Sodium, Urine Random: 44 mmol/L (ref 20–110)

## 2014-03-26 LAB — PROTEIN / CREATININE RATIO, URINE
Creatinine, Urine: 85.9 mg/dL (ref 30.0–125.0)
PROTEIN/CREAT. RATIO: 1118 mg/g{creat} — AB (ref 0–200)
Protein, Random Urine: 96 mg/dL — ABNORMAL HIGH (ref 0–12)

## 2014-03-26 LAB — FERRITIN: Ferritin (ARMC): 77 ng/mL (ref 8–388)

## 2014-03-26 LAB — IRON AND TIBC
Iron Bind.Cap.(Total): 281 ug/dL (ref 250–450)
Iron Saturation: 18 %
Iron: 51 ug/dL — ABNORMAL LOW (ref 65–175)
UNBOUND IRON-BIND. CAP.: 230 ug/dL

## 2014-03-26 LAB — PLATELET COUNT: Platelet: 100 10*3/uL — ABNORMAL LOW (ref 150–440)

## 2014-03-27 LAB — BASIC METABOLIC PANEL
ANION GAP: 5 — AB (ref 7–16)
BUN: 35 mg/dL — ABNORMAL HIGH (ref 7–18)
CHLORIDE: 105 mmol/L (ref 98–107)
Calcium, Total: 8.5 mg/dL (ref 8.5–10.1)
Co2: 32 mmol/L (ref 21–32)
Creatinine: 2.04 mg/dL — ABNORMAL HIGH (ref 0.60–1.30)
EGFR (African American): 35 — ABNORMAL LOW
GFR CALC NON AF AMER: 31 — AB
Glucose: 44 mg/dL — ABNORMAL LOW (ref 65–99)
Osmolality: 288 (ref 275–301)
Potassium: 3.5 mmol/L (ref 3.5–5.1)
SODIUM: 142 mmol/L (ref 136–145)

## 2014-03-27 LAB — MAGNESIUM: Magnesium: 1.7 mg/dL — ABNORMAL LOW

## 2014-03-28 ENCOUNTER — Ambulatory Visit (INDEPENDENT_AMBULATORY_CARE_PROVIDER_SITE_OTHER): Payer: Medicare Other | Admitting: Internal Medicine

## 2014-03-28 ENCOUNTER — Telehealth: Payer: Self-pay | Admitting: Internal Medicine

## 2014-03-28 ENCOUNTER — Encounter: Payer: Self-pay | Admitting: Internal Medicine

## 2014-03-28 ENCOUNTER — Other Ambulatory Visit: Payer: Self-pay | Admitting: Cardiovascular Disease

## 2014-03-28 ENCOUNTER — Telehealth: Payer: Self-pay

## 2014-03-28 VITALS — BP 130/60 | HR 57 | Temp 98.4°F | Ht 73.0 in | Wt 314.2 lb

## 2014-03-28 DIAGNOSIS — R600 Localized edema: Secondary | ICD-10-CM

## 2014-03-28 DIAGNOSIS — N289 Disorder of kidney and ureter, unspecified: Secondary | ICD-10-CM

## 2014-03-28 DIAGNOSIS — R609 Edema, unspecified: Secondary | ICD-10-CM

## 2014-03-28 DIAGNOSIS — D696 Thrombocytopenia, unspecified: Secondary | ICD-10-CM

## 2014-03-28 DIAGNOSIS — E162 Hypoglycemia, unspecified: Secondary | ICD-10-CM

## 2014-03-28 DIAGNOSIS — E0821 Diabetes mellitus due to underlying condition with diabetic nephropathy: Secondary | ICD-10-CM

## 2014-03-28 DIAGNOSIS — G4733 Obstructive sleep apnea (adult) (pediatric): Secondary | ICD-10-CM

## 2014-03-28 DIAGNOSIS — M542 Cervicalgia: Secondary | ICD-10-CM

## 2014-03-28 DIAGNOSIS — I480 Paroxysmal atrial fibrillation: Secondary | ICD-10-CM

## 2014-03-28 DIAGNOSIS — E7800 Pure hypercholesterolemia, unspecified: Secondary | ICD-10-CM

## 2014-03-28 DIAGNOSIS — I509 Heart failure, unspecified: Secondary | ICD-10-CM

## 2014-03-28 DIAGNOSIS — I10 Essential (primary) hypertension: Secondary | ICD-10-CM

## 2014-03-28 DIAGNOSIS — E119 Type 2 diabetes mellitus without complications: Secondary | ICD-10-CM

## 2014-03-28 DIAGNOSIS — I5032 Chronic diastolic (congestive) heart failure: Secondary | ICD-10-CM

## 2014-03-28 DIAGNOSIS — E1329 Other specified diabetes mellitus with other diabetic kidney complication: Secondary | ICD-10-CM

## 2014-03-28 DIAGNOSIS — I4891 Unspecified atrial fibrillation: Secondary | ICD-10-CM

## 2014-03-28 DIAGNOSIS — N058 Unspecified nephritic syndrome with other morphologic changes: Secondary | ICD-10-CM

## 2014-03-28 DIAGNOSIS — I5021 Acute systolic (congestive) heart failure: Secondary | ICD-10-CM

## 2014-03-28 LAB — GLUCOSE, FINGERSTICK (STAT): Glucose, fingerstick: 70

## 2014-03-28 NOTE — Progress Notes (Signed)
Pre visit review using our clinic review tool, if applicable. No additional management support is needed unless otherwise documented below in the visit note. 

## 2014-03-28 NOTE — Telephone Encounter (Signed)
l msg with pt wife for pt to schedule tcm 7-10 days

## 2014-03-28 NOTE — Telephone Encounter (Signed)
Message copied by Coralee Rud on Tue Mar 28, 2014  2:58 PM ------      Message from: Lorine Bears A      Created: Mon Mar 27, 2014 12:00 PM       TCM follow up needed in 7-10 days (heart failure) ------

## 2014-03-28 NOTE — Patient Instructions (Signed)
Stop januvia

## 2014-03-29 LAB — PROTEIN ELECTROPHORESIS(ARMC)

## 2014-03-29 LAB — UR PROT ELECTROPHORESIS, URINE RANDOM

## 2014-03-30 ENCOUNTER — Encounter: Payer: Self-pay | Admitting: Internal Medicine

## 2014-03-30 DIAGNOSIS — E162 Hypoglycemia, unspecified: Secondary | ICD-10-CM | POA: Insufficient documentation

## 2014-03-30 NOTE — Assessment & Plan Note (Signed)
Seeing nephrology.  Follow metabolic panel.  Avoid nephrotoxic agents.   

## 2014-03-30 NOTE — Assessment & Plan Note (Signed)
Blood pressure has been under reasonable control.  Follow pressures.  Follow metabolic panel.   

## 2014-03-30 NOTE — Progress Notes (Signed)
Subjective:    Patient ID: [name], male    DOB: Jul 15, 1935, 79 y.o.   MRN: [id number]  HPI 79 year old male with past history of hypertension, hypercholesterolemia, diabetes with renal insufficiency (previously followed by Dr Hyacinth Meeker) and thrombocytopenia who comes in today for a hospital follow up/scheduled follow up.  He was hospitalized from 03/24/14 - 03/27/14 with worsening sob secondary to congestive heart failure.  He presented with worsening lower extremity edema, sob and significant weight gain.  He was diuresed and medications adjusted.  Discharged yesterday.   Had ECHO that revealed EF 55-60%.  States his leg swelling is better.  Abdomen not as tight.  Still with some sob, but overall symptoms have improved.  We discussed his medications.  He is now on carvedilol.  Blood pressure appears to be doing well.   Also has known COPD and is followed by Dr Kendrick Fries.  No acid reflux.  Swallowing is better on omeprazole.   Denies any chest pain.  No increased heart rate or palpitations.  No increased cough or congestion.  Brought in no blood sugar readings, but states overall blood sugars are good.   Actually has noticed some low blood sugars.   We have discussed adjusting his insulin.  Will stop [place].   Does report some persistent neck pain.  Better than previous check.  Appears to have been aggravated in the hospital.    Seeing Dr Yves Dill.  Some constipation.  When uses a suppository, has a bowel movement.            Past Medical History  Diagnosis Date  . Arthritis   . Sleep apnea     a. On CPAP; b. Severe OSA by sleep study 01/2014.  Marland Kitchen COPD (chronic obstructive pulmonary disease)   . Hypercholesterolemia   . Hypertension   . Thrombocytopenia   . CKD (chronic kidney disease), stage III   . Diverticulosis   . History of colon polyps   . Type II diabetes mellitus   . Chronic diastolic CHF (congestive heart failure), NYHA class 3     a. 06/2013 Echo: EF 55-60%, mild conc LVH, mildly dil  LA/RA, mild-mod Ao sclerosis w/o stenosis.  . Chest pain     a. 01/2013 Stress only myoview: low risk->Med Rx.  Marland Kitchen PAF (paroxysmal atrial fibrillation)     a. On amio.  Previously on Xarelto but self discontinued 2/2 concern r/t swelling.    Current Outpatient Prescriptions on File Prior to Visit  Medication Sig Dispense Refill  . amiodarone (PACERONE) 200 MG tablet Take 1 tablet (200 mg total) by mouth daily.  30 tablet  6  . atorvastatin (LIPITOR) 20 MG tablet Take 1 tablet (20 mg total) by mouth daily.  90 tablet  1  . budesonide-formoterol (SYMBICORT) 80-4.5 MCG/ACT inhaler Inhale 2 puffs into the lungs 2 (two) times daily.  1 Inhaler  12  . escitalopram (LEXAPRO) 10 MG tablet TAKE 1/2 TABLET BY MOUTH ONCE A DAY  15 tablet  2  . glucose blood test strip Use three times a day as instructed  150 each  5  . HUMALOG 100 UNIT/ML injection INJECT 24 UNITS AT BREAKFAST, 22 UNITS AT LUNCH & 14 UNITS AT SUPPER  30 mL  3  . hyoscyamine (LEVBID) 0.375 MG 12 hr tablet Take 1 tablet (0.375 mg total) by mouth 2 (two) times daily.  60 tablet  2  . insulin glargine (LANTUS) 100 UNIT/ML injection TAKE 26 UNITS IN THE AM  AND 35 UNITS IN THE PM.      . JANUVIA 100 MG tablet TAKE 1 TABLET BY MOUTH ONCE A DAY  30 tablet  5  . potassium chloride SA (K-DUR,KLOR-CON) 20 MEQ tablet Takes 1/2 tablet am and 1/2 tablet pm daily.      . pregabalin (LYRICA) 75 MG capsule TAKE ONE CAPSULE BY MOUTH TWICE A DAY  60 capsule  2  . Tamsulosin HCl (FLOMAX) 0.4 MG CAPS Take 0.4 mg by mouth daily.       Marland Kitchen torsemide (DEMADEX) 20 MG tablet Take 40 mg by mouth 2 (two) times daily.       Marland Kitchen levalbuterol (XOPENEX HFA) 45 MCG/ACT inhaler Inhale 2 puffs into the lungs every 4 (four) hours as needed for wheezing.  1 Inhaler  5  . zoster vaccine live, PF, (ZOSTAVAX) 16109 UNT/0.65ML injection Inject 19,400 Units into the skin once.  1 each  0   No current facility-administered medications on file prior to visit.    Review of  Systems Patient denies lightheadedness or dizziness.  Some neck pain as outlined.  No sinus or allergy symptoms.  No chest pain or palpitations.  No increased heart rate.  No cough or congestion.  Breathing stable. See above.  Still some sob.   No nausea or vomiting.  No abdominal pain or cramping.   Abdomen swelling and fullness better after his hospitalization.  No urine change.   Still seeing Dr Franky Macho for his back.  Sugars varying.  Some lows.  Have discussed the importance of eating regular meals and not going long periods without eating.  Discussed how to adjust his insulin.  Will stop [place]. Has had some issues with constipation.         Objective:   Physical Exam  Filed Vitals:   03/28/14 1408  BP: 130/60  Pulse: 57  Temp: 98.4 F (36.9 C)   Blood pressure recheck:  130/62, pulse 61  79 year old male in no acute distress.  HEENT:  Nares - clear.  Oropharynx - without lesions. NECK:  No audible carotid bruit.   Increased pain in his head with rotation.  HEART:  Rate controlled.  Appears to be regular.    LUNGS:  No crackles or wheezing audible.  Respirations even and unlabored.   RADIAL PULSE:  Equal bilaterally.  ABDOMEN:  Nontender.  Bowel sounds present and normal.  No audible abdominal bruit.    EXTREMITIES:  Edema - stable.   Compression hose in place.       Assessment & Plan:  MSK.  S/p knee surgery.   Continues to f/u with Dr Gavin Potters.       HEALTH MAINTENANCE.  Prostate and PSAs through Dr Achilles Dunk.  Colonoscopy 10/21/11 - diverticulosis and several polyps. Due follow up colonoscopy in 2016.    I spent more than 40 minutes with this patient and more than 50% of the time was spent in consultation regarding the above, specifically discussing his preadmission symptoms, his treatment and hospital course, addressing his low blood sugar in the office, discussing and adjusting his medications, etc.

## 2014-03-30 NOTE — Assessment & Plan Note (Signed)
Using CPAP.  Follow.  

## 2014-03-30 NOTE — Assessment & Plan Note (Signed)
Just admitted with worsening sob, leg swelling and weight gain.  Was diuresed.  Medications adjusted. Lower extremity swelling and abdominal fullness better.  ECHO as outlined.  Medications adjusted.  Needs f/u with cardiology.

## 2014-03-30 NOTE — Assessment & Plan Note (Signed)
Persistent.  Tylenol as directed.  Follow.

## 2014-03-30 NOTE — Assessment & Plan Note (Signed)
Had an episode prior to leaving where he felt weak.  Sugar 70.  Was given crackers and juice.  Sugar improved - 121.  Pt felt back to baseline.  Insulin adjusted.  Stop [place].

## 2014-03-30 NOTE — Assessment & Plan Note (Signed)
Swelling is better (per report).  Compression hose in place.

## 2014-03-30 NOTE — Assessment & Plan Note (Signed)
Follow cbc.  

## 2014-03-30 NOTE — Assessment & Plan Note (Signed)
Have discussed importance of diet.  Have discussed importance of eating a snack before bed, eating regular meals and not going long periods without eating.  Problems with lows intermittently.   Have discussed adjusting insulin.  Will stop januvia and decrease his humalog to 20 units in the am, 18 units at lunch and 10 units at supper.  Discussed with him on monitoring carb intake and adjusting the humalog more - pending sugar readings and food intake.  Follow sugars and send in over the next 1-2 weeks.  Call if problems.

## 2014-03-30 NOTE — Assessment & Plan Note (Signed)
Sees cardiology.   Appears to be in SR.

## 2014-03-30 NOTE — Telephone Encounter (Signed)
Patient contacted regarding discharge from [facility] on 03/27/14.  Patient understands to follow up with Ward Givens, NP on 04/04/14 at 2:45 at [facility]. Patient understands discharge instructions? yes Patient understands medications and regiment? yes Patient understands to bring all medications to this visit? yes

## 2014-03-30 NOTE — Assessment & Plan Note (Signed)
Low cholesterol diet and exercise.  Back on lipitor.  Follow.

## 2014-04-04 ENCOUNTER — Ambulatory Visit (INDEPENDENT_AMBULATORY_CARE_PROVIDER_SITE_OTHER): Payer: Medicare Other | Admitting: Nurse Practitioner

## 2014-04-04 ENCOUNTER — Encounter: Payer: Self-pay | Admitting: Nurse Practitioner

## 2014-04-04 VITALS — BP 148/70 | HR 63 | Ht 73.0 in | Wt 301.0 lb

## 2014-04-04 DIAGNOSIS — N183 Chronic kidney disease, stage 3 unspecified: Secondary | ICD-10-CM

## 2014-04-04 DIAGNOSIS — I5032 Chronic diastolic (congestive) heart failure: Secondary | ICD-10-CM | POA: Diagnosis not present

## 2014-04-04 DIAGNOSIS — R0602 Shortness of breath: Secondary | ICD-10-CM

## 2014-04-04 DIAGNOSIS — I10 Essential (primary) hypertension: Secondary | ICD-10-CM

## 2014-04-04 DIAGNOSIS — I480 Paroxysmal atrial fibrillation: Secondary | ICD-10-CM

## 2014-04-04 MED ORDER — AMLODIPINE BESYLATE 10 MG PO TABS: 10.0000 mg | ORAL_TABLET | Freq: Every day | ORAL | Status: DC

## 2014-04-04 NOTE — Progress Notes (Signed)
Patient Name: [name] Date of Encounter: 04/04/2014  Primary Care Provider:  Charm Barges, MD Primary Cardiologist:  Concha Se, MD   Patient Profile  79 year old male with history of diastolic heart failure who was seen in clinic last week and required admission for IV diuresis.  Problem List   Past Medical History  Diagnosis Date  . Arthritis   . Sleep apnea     a. On CPAP; b. Severe OSA by sleep study 01/2014.  Marland Kitchen COPD (chronic obstructive pulmonary disease)   . Hypercholesterolemia   . Hypertension   . Thrombocytopenia   . CKD (chronic kidney disease), stage III   . Diverticulosis   . History of colon polyps   . Type II diabetes mellitus   . Chronic diastolic CHF (congestive heart failure), NYHA class 3     a. 06/2013 Echo: EF 55-60%, mild conc LVH, mildly dil LA/RA, mild-mod Ao sclerosis w/o stenosis;  b. 02/2014 Echo: EF 55-60%, mild conc LVH, mildly dil LA/RA, mild-mod Ao sclerosis w/o stenosis.  . Chest pain     a. 01/2013 Stress only myoview: low risk->Med Rx.  Marland Kitchen PAF (paroxysmal atrial fibrillation)     a. On amio.  Previously on Xarelto but self discontinued 2/2 concern r/t swelling.   Past Surgical History  Procedure Laterality Date  . Cataract extraction      Left eye  . Back surgery  2000  . Trigger finger release Left   . Colonoscopy    . Cardioversion    . Medial partial knee replacement  06/2013    right    Allergies  Allergies  Allergen Reactions  . Nsaids     Other reaction(s): Other (See Comments) Renal insufficiency  . Oxycodone   . Tetanus Immune Globulin     Other reaction(s): UNKNOWN  . Tramadol     HPI  79 year old white male with the above complex problems.  I last saw him in clinic last week at which time he reported a 10 pound weight gain and had significant overload on exam with dyspnea.  We had him admitted to [facility] where he was placed on aggressive IV diuretics with improvement in volume status.  He did  have a rise in his creatinine and he was switched from Lasix to oral torsemide.  Since his discharge, he says that his weight has continued to come down and was 301 on his home scale this morning.  He is 301 pounds on our scale today as well which is 14 pounds down from where he was a week ago.  He says he has had significant improvement in his degree of dyspnea on exertion and has been able to be more active since discharge.  He continues to have significant lower extremity edema up to his thighs bilaterally but he says overall this is better than his previous baseline.  He does have support hose at home and says that he wears them but is not wearing them today.  He denies chest pain,PND, orthopnea, dizziness, syncope, or early satiety.  He is watching his salt intake more closely than he previously was.  He checks his blood pressure on regular basis and notes that typically in the mid to high 140s.  Home Medications  Prior to Admission medications   Medication Sig Start Date End Date Taking? Authorizing Provider  amiodarone (PACERONE) 200 MG tablet Take 1 tablet (200 mg total) by mouth daily. 07/26/13  Yes Antonieta Iba, MD  amLODipine (NORVASC) 10  MG tablet Take 1 tablet (10 mg total) by mouth daily. 04/04/14  Yes Ok Anis, NP  atorvastatin (LIPITOR) 20 MG tablet Take 1 tablet (20 mg total) by mouth daily. 11/23/13  Yes Dale Pitkin, MD  budesonide-formoterol (SYMBICORT) 80-4.5 MCG/ACT inhaler Inhale 2 puffs into the lungs 2 (two) times daily. 02/01/13  Yes Lupita Leash, MD  carvedilol (COREG) 6.25 MG tablet Take 6.25 mg by mouth 2 (two) times daily with a meal.   Yes Historical Provider, MD  cloNIDine (CATAPRES) 0.1 MG tablet TAKE 1 TABLET (0.1 MG TOTAL) BY MOUTH 2 (TWO) TIMES DAILY. 03/28/14  Yes Antonieta Iba, MD  dexlansoprazole (DEXILANT) 60 MG capsule Take 60 mg by mouth daily.   Yes Historical Provider, MD  escitalopram (LEXAPRO) 10 MG tablet TAKE 1/2 TABLET BY MOUTH ONCE A DAY  12/28/13  Yes Dale Nance, MD  glucose blood test strip Use three times a day as instructed 01/24/13  Yes Dale Sweetwater, MD  hyoscyamine (LEVBID) 0.375 MG 12 hr tablet Take 1 tablet (0.375 mg total) by mouth 2 (two) times daily. 12/30/13  Yes Dale Roscommon, MD  insulin glargine (LANTUS) 100 UNIT/ML injection TAKE 26 UNITS IN THE AM AND 35 UNITS IN THE PM. 03/03/14  Yes Dale Tedrow, MD  insulin lispro (HUMALOG) 100 UNIT/ML injection INJECT 22 UNITS AT BREAKFAST, 20 UNITS AT LUNCH & 12 UNITS AT SUPPER 02/02/14  Yes Sherlene Shams, MD  levalbuterol [facility] HFA) 45 MCG/ACT inhaler Inhale 2 puffs into the lungs every 4 (four) hours as needed for wheezing. 10/08/12  Yes Dale Hemlock, MD  potassium chloride SA (K-DUR,KLOR-CON) 20 MEQ tablet Take 20 mEq by mouth daily.  12/17/12  Yes Antonieta Iba, MD  pregabalin (LYRICA) 75 MG capsule TAKE ONE CAPSULE BY MOUTH TWICE A DAY 03/03/14  Yes Dale [place] Home, MD  Tamsulosin HCl (FLOMAX) 0.4 MG CAPS Take 0.4 mg by mouth daily.  08/12/12  Yes Historical Provider, MD  tiZANidine (ZANAFLEX) 4 MG tablet Take 0.5 mg by mouth at bedtime as needed for muscle spasms.   Yes Historical Provider, MD  torsemide (DEMADEX) 20 MG tablet Take 40 mg by mouth 2 (two) times daily.  01/18/14  Yes Antonieta Iba, MD  zoster vaccine live, PF, (ZOSTAVAX) 16109 UNT/0.65ML injection Inject 19,400 Units into the skin once. 12/28/13  Yes Raquel Conni Elliot, NP    Review of Systems  He continues to have lower extremity edema up to his thighs.  His dyspnea and abdominal girth have improved.  Is not having any chest pain.  Exercise tolerance has improved.  All other systems reviewed and are otherwise negative except as noted above.  Physical Exam  Blood pressure 148/70, pulse 63, height  (1.854 m), weight 301 lb (136.533 kg).  General: Pleasant, NAD Psych: Normal affect. Neuro: Alert and oriented X 3. Moves all extremities spontaneously. HEENT: Normal  Neck: Supple without bruits.   Difficult to assess JVP secondary to girth. Lungs:  Resp regular and unlabored, somewhat diminished but no obvious crackles. Heart: RRR no s3, s4, 2/6 systolic murmur at the right upper sternal border. Abdomen: protuberant, semi-firm, non-tender, non-distended, BS + x 4.  Extremities: No clubbing, cyanosis.  He has 2-3+ bilateral lower extremity edema to his mid thighs.  Left-sided edema slightly worse than right. DP/PT/Radials 1+ and equal bilaterally.  Accessory Clinical Findings  ECG - sinus rhythm, 63, first degree AV block, right bundle branch block, no acute ST or T changes.  Assessment &  Plan  1.  Chronic diastolic congestive heart failure, class II:  He is status post recent admission during which he was aggressively diuresed.  His weight is down from 3:15 on September 4 for 301 today.  He has noted improvement in chronic lower extreme edema and abdominal girth though overall he still has significant edema.  His blood pressure is moderately elevated today and we'll increase his amlodipine to 10 mg daily.  He remains on carvedilol and clonidine.  With relative bradycardia, would not titrate either of these.  I will check a basic metabolic profile today.  I have encouraged him to wear his support hose during the day.  His creatinine is stable I would consider him to short-term increase in diuretic dosing to try and further improve his volume status.  2.  Proximal atrial fibrillation colon remains in sinus rhythm on amiodarone.  He was previously on Xarelto but discontinued this because he thought it was causing swelling.  3.  Hypertension: As above, blood pressure is moderately elevated.  I will increase his amlodipine to 10 mg daily.  4.  Stage III chronic kidney disease: Followup basic metabolic panel today.  5.  Morbid obesity: Exercise tolerance has been limited by multifactorial dyspnea in the setting of diastolic heart failure and COPD.  We discussed the importance of dietary  modifications, activity, and weight loss.  6.  Insulin-dependent diabetes mellitus: Insulin therapy per internal medicine.  7.  Hyperlipidemia: Continue statin therapy.  8.  Disposition: Followup with Dr. Mariah Milling in 2 wks.    Nicolasa Ducking, NP 04/04/2014, 3:11 PM

## 2014-04-04 NOTE — Patient Instructions (Signed)
We will draw labs today:  BMET  Please increase your amlodipine to  daily  Please wear your compression hose  Please weigh yourself every morning, preferably after you urinate and before you eat Call the office if gain more than 2 lbs overnight or 5lbs in a week  Please follow up with Dr. Mariah Milling in 2 weeks

## 2014-04-05 LAB — BASIC METABOLIC PANEL
BUN / CREAT RATIO: 11 (ref 10–22)
BUN: 20 mg/dL (ref 8–27)
CHLORIDE: 103 mmol/L (ref 97–108)
CO2: 26 mmol/L (ref 18–29)
Calcium: 8.6 mg/dL (ref 8.6–10.2)
Creatinine, Ser: 1.83 mg/dL — ABNORMAL HIGH (ref 0.76–1.27)
GFR calc non Af Amer: 35 mL/min/{1.73_m2} — ABNORMAL LOW (ref >59)
GFR, EST AFRICAN AMERICAN: 40 mL/min/{1.73_m2} — AB (ref >59)
Glucose: 192 mg/dL — ABNORMAL HIGH (ref 65–99)
Potassium: 4.4 mmol/L (ref 3.5–5.2)
SODIUM: 146 mmol/L — AB (ref 134–144)

## 2014-04-06 ENCOUNTER — Other Ambulatory Visit: Payer: Self-pay | Admitting: Internal Medicine

## 2014-04-21 ENCOUNTER — Ambulatory Visit (INDEPENDENT_AMBULATORY_CARE_PROVIDER_SITE_OTHER): Payer: Medicare Other | Admitting: Cardiovascular Disease

## 2014-04-21 ENCOUNTER — Encounter: Payer: Self-pay | Admitting: Cardiovascular Disease

## 2014-04-21 ENCOUNTER — Telehealth: Payer: Self-pay

## 2014-04-21 VITALS — BP 150/62 | HR 65 | Ht 73.0 in | Wt 315.5 lb

## 2014-04-21 DIAGNOSIS — G4733 Obstructive sleep apnea (adult) (pediatric): Secondary | ICD-10-CM

## 2014-04-21 DIAGNOSIS — I5032 Chronic diastolic (congestive) heart failure: Secondary | ICD-10-CM

## 2014-04-21 DIAGNOSIS — E0821 Diabetes mellitus due to underlying condition with diabetic nephropathy: Secondary | ICD-10-CM

## 2014-04-21 DIAGNOSIS — R0602 Shortness of breath: Secondary | ICD-10-CM

## 2014-04-21 DIAGNOSIS — E7800 Pure hypercholesterolemia, unspecified: Secondary | ICD-10-CM

## 2014-04-21 DIAGNOSIS — I480 Paroxysmal atrial fibrillation: Secondary | ICD-10-CM

## 2014-04-21 DIAGNOSIS — I10 Essential (primary) hypertension: Secondary | ICD-10-CM

## 2014-04-21 MED ORDER — TORSEMIDE 20 MG PO TABS: 80.0000 mg | ORAL_TABLET | Freq: Two times a day (BID) | ORAL | Status: DC

## 2014-04-21 MED ORDER — METOLAZONE 5 MG PO TABS: 5.0000 mg | ORAL_TABLET | Freq: Every day | ORAL | Status: DC | PRN

## 2014-04-21 MED ORDER — METOLAZONE 5 MG PO TABS: 5.0000 mg | ORAL_TABLET | Freq: Every day | ORAL | Status: DC

## 2014-04-21 NOTE — Patient Instructions (Addendum)
  For weight <300, take torsemide 40 mg once a day For weight  300 to 305, take torsemide 40 mg twice a day For weight 306 to 310, take torsemide 80 mg twice a day For weight >310, take metolazone 5 mg in the Am, 30 minutes later, take torsemide 80 mg , take torsemide 80 again in the PM  Please call us if you have new issues that need to be addressed before your next appt.  Your physician wants you to follow-up in:  2 weeks

## 2014-04-21 NOTE — Telephone Encounter (Signed)
Pt is sched to be seen this afternoon.

## 2014-04-21 NOTE — Progress Notes (Signed)
Patient ID: [name], male    DOB: [birth year], 79 y.o.   MRN: [id number]  HPI Comments: 79 year old male with past history of morbid obesity, chronic diastolic CHF, hypertension, hypercholesterolemia, diabetes with renal insufficiency, thrombocytopenia patient of, Dr. Lorin PicketScott,  episode of atrial fibrillation in early 2014.  history of COPD, obstructive sleep apnea, on CPAP.  smoked for 25 years, stopped 30 years ago. On his initial clinic visit, he was started on rate control medications for atrial fibrillation  and diuretic for significant lower extremity edema and shortness of breath. Also started on anticoagulation. elective cardioversion on October 27 2012 which was successful.   In followup today, reports that his weight has been trending up despite using increasing amounts of torsemide. He is unable to lay flat secondary to nasal congestion, sits up on the side of his bed with his CPAP in place until he "passes out" and malaise back into bed. Weight is up approximately 15 pounds He has severe leg edema up to his thighs Continues to drink significant fluids in the daytime as his mouth is always dry Is currently taking torsemide 40 mg twice a day  Weight continues to be a major issue. He does not do any regular exercise program. hemoglobin A1c 7.1   typically baseline creatinine 1.5 up to 1.8 His legs are down most of the day. He does not wear compression hose   in the hospital 06/26/2013 for shortness of breath BNP 4500. He had aggressive diuresis and was -11 L through his hospital course,  ejection fraction 55-60%, creatinine 2.1 but went as high as 2.4, hemoglobin A1c 7.2. He was in normal sinus rhythm during his hospital course   History of  severe neck pain. He reports having an episode of neck pain previously that seemed to resolve after an MRI. Then he developed hip pain and was seeking care from an orthopedic specialist.  He reports being compliant with his CPAP  Echocardiogram   shows normal ejection fraction with moderately dilated left atrium, high normal right ventricular systolic pressure   EKG today shows normal sinus rhythm with rate 66 beats per minute, right bundle branch block, no other significant ST or T wave changes Reports usual heart rate is in the 40s and he has no symptoms  Past Medical History . Arthritis  . 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 Sleep apnea  . COPD (chronic obstructive pulmonary disease)  . Hypercholesterolemia  . Hypertension  . Thrombocytopenia  . Renal insufficiency  . Diverticulosis  . History of colon polyps    Outpatient Encounter Prescriptions as of 04/21/2014  Medication Sig  . amiodarone (PACERONE) 200 MG tablet Take 1 tablet (200 mg total) by mouth daily.  Marland Kitchen. amLODipine (NORVASC) 10 MG tablet Take 1 tablet (10 mg total) by mouth daily.  Marland Kitchen. atorvastatin (LIPITOR) 20 MG tablet Take 1 tablet (20 mg total) by mouth daily.  . budesonide-formoterol (SYMBICORT) 80-4.5 MCG/ACT inhaler Inhale 2 puffs into the lungs 2 (two) times daily.  . carvedilol (COREG) 6.25 MG tablet Take 6.25 mg by mouth 2 (two) times daily with a meal.  . cloNIDine (CATAPRES) 0.1 MG tablet TAKE 1 TABLET (0.1 MG TOTAL) BY MOUTH 2 (TWO) TIMES DAILY.  Marland Kitchen. dexlansoprazole (DEXILANT) 60 MG capsule Take 60 mg by mouth daily.  Marland Kitchen. escitalopram (LEXAPRO) 10 MG tablet TAKE 1/2 TABLET BY MOUTH ONCE A DAY  . glucose blood test strip Use three times a day as  instructed  . hyoscyamine (LEVBID) 0.375 MG 12 hr tablet Take 1 tablet (0.375 mg total) by mouth 2 (two) times daily.  . insulin glargine (LANTUS) 100 UNIT/ML injection TAKE 26 UNITS IN THE AM AND 35 UNITS IN THE PM.  . insulin lispro (HUMALOG) 100 UNIT/ML injection INJECT 22 UNITS AT BREAKFAST, 20 UNITS AT LUNCH & 12 UNITS AT SUPPER  . levalbuterol (XOPENEX HFA) 45 MCG/ACT inhaler Inhale 2 puffs into the lungs every 4 (four) hours as needed for wheezing.  . potassium chloride SA  (K-DUR,KLOR-CON) 20 MEQ tablet Take 20 mEq by mouth daily.   . pregabalin (LYRICA) 75 MG capsule TAKE ONE CAPSULE BY MOUTH TWICE A DAY  . Tamsulosin HCl (FLOMAX) 0.4 MG CAPS Take 0.4 mg by mouth daily.   Marland Kitchen tiZANidine (ZANAFLEX) 4 MG tablet Take 0.5 mg by mouth at bedtime as needed for muscle spasms.  Marland Kitchen torsemide (DEMADEX) 20 MG tablet Take 40 mg by mouth 2 (two) times daily.   Marland Kitchen zoster vaccine live, PF, (ZOSTAVAX) 16109 UNT/0.65ML injection Inject 19,400 Units into the skin once.    Review of Systems  Constitutional: Negative.   HENT: Negative.   Eyes: Negative.   Respiratory: Positive for shortness of breath.   Cardiovascular: Positive for leg swelling.  Gastrointestinal: Negative.   Endocrine: Negative.   Musculoskeletal: Negative.   Skin: Negative.   Allergic/Immunologic: Negative.   Neurological: Negative.   Hematological: Negative.   Psychiatric/Behavioral: Positive for sleep disturbance.  All other systems reviewed and are negative.   BP 150/62  Pulse 65  Ht 6\' 1"  (1.854 m)  Wt 315 lb 8 oz (143.11 kg)  BMI 41.63 kg/m2  Physical Exam  Nursing note and vitals reviewed. Constitutional: He is oriented to person, place, and time. He appears well-developed and well-nourished.  Morbidly obese  HENT:  Head: Normocephalic.  Nose: Nose normal.  Mouth/Throat: Oropharynx is clear and moist.  Eyes: Conjunctivae are normal. Pupils are equal, round, and reactive to light.  Neck: Normal range of motion. Neck supple. No JVD present.  Cardiovascular: Normal rate, S1 normal, S2 normal, normal heart sounds and intact distal pulses.  Exam reveals no gallop and no friction rub.   No murmur heard. 2+ pitting edema to the thighs  Pulmonary/Chest: Effort normal. No respiratory distress. He has no wheezes. He exhibits no tenderness.  Abdominal: Soft. Bowel sounds are normal. He exhibits no distension. There is no tenderness.  Musculoskeletal: Normal range of motion. He exhibits no edema and  no tenderness.  Lymphadenopathy:    He has no cervical adenopathy.  Neurological: He is alert and oriented to person, place, and time. Coordination normal.  Skin: Skin is warm and dry. No rash noted. No erythema.  Psychiatric: He has a normal mood and affect. His behavior is normal. Judgment and thought content normal.      Assessment and Plan

## 2014-04-21 NOTE — Telephone Encounter (Signed)
FYI: Patient is up 6.5 lbs in 5 days, has shortness of breath, LE edema, using many pillows in order to get comfortable, very fatigue and having sleeping difficulties. If you have any questions please contact Vickie Burlow at [phone] Ext. [phone].

## 2014-04-23 NOTE — Assessment & Plan Note (Signed)
Wears CPAP nightly. Significant nasal congestion when supine

## 2014-04-23 NOTE — Assessment & Plan Note (Addendum)
Poor candidate in his current state for gastric bypass surgery. Counseled him on diet modification

## 2014-04-23 NOTE — Assessment & Plan Note (Signed)
Significant dietary and fluid indiscretion Acute on chronic diastolic CHF on today's visit, weight significantly from prior visits We have suggested he follow the recommendation below:  For weight <300, take torsemide 40 mg once a day For weight  300 to 305, take torsemide 40 mg twice a day For weight 306 to 310, take torsemide 80 mg twice a day For weight >310, take metolazone 5 mg in the Am, 30 minutes later, take torsemide 80 mg , take torsemide 80 again in the PM  We will see him in close followup for lab work and surveillance

## 2014-04-23 NOTE — Assessment & Plan Note (Signed)
Encouraged him to stay on his Lipitor 

## 2014-04-23 NOTE — Assessment & Plan Note (Signed)
Blood pressure mildly elevated likely from fluid retention. We'll focus on diuresis and weight loss

## 2014-04-23 NOTE — Assessment & Plan Note (Signed)
We have encouraged exercise, careful diet management in an effort to lose weight. 

## 2014-04-23 NOTE — Assessment & Plan Note (Signed)
Maintaining normal sinus rhythm. He is high risk of recurrent arrhythmia given his acute on chronic diastolic CHF, obstructive sleep apnea

## 2014-04-25 DIAGNOSIS — N183 Chronic kidney disease, stage 3 (moderate): Secondary | ICD-10-CM

## 2014-04-25 DIAGNOSIS — I480 Paroxysmal atrial fibrillation: Secondary | ICD-10-CM

## 2014-04-25 DIAGNOSIS — I5032 Chronic diastolic (congestive) heart failure: Secondary | ICD-10-CM

## 2014-04-25 DIAGNOSIS — I10 Essential (primary) hypertension: Secondary | ICD-10-CM

## 2014-05-07 ENCOUNTER — Other Ambulatory Visit: Payer: Self-pay | Admitting: Internal Medicine

## 2014-05-09 ENCOUNTER — Other Ambulatory Visit: Payer: Self-pay | Admitting: Internal Medicine

## 2014-05-09 ENCOUNTER — Telehealth: Payer: Self-pay | Admitting: Cardiovascular Disease

## 2014-05-09 NOTE — Telephone Encounter (Signed)
Spoke w/ pt.  He states that he thought he was taking his fluid pill, but accidentally took too many amiodarone.  Advised him that I had spoken w/ Dr. Mariah MillingGollan who advises pt to monitor his HR today and skip tomorrow's dose.   He verbalizes understanding and will keep his appt w/ Dr. Mariah MillingGollan tomorrow.

## 2014-05-09 NOTE — Telephone Encounter (Signed)
Attempted to contact pt.  Line is busy x 5 attempts.

## 2014-05-09 NOTE — Telephone Encounter (Signed)
Pt amidarone hcl 200 mg pt took 800 mg, pt is worried, pt took it about an hour and half ago.

## 2014-05-09 NOTE — Telephone Encounter (Signed)
Attempted to contact pt. Line busy

## 2014-05-10 ENCOUNTER — Ambulatory Visit (INDEPENDENT_AMBULATORY_CARE_PROVIDER_SITE_OTHER): Payer: Medicare Other | Admitting: Cardiovascular Disease

## 2014-05-10 ENCOUNTER — Encounter: Payer: Self-pay | Admitting: Cardiovascular Disease

## 2014-05-10 VITALS — BP 157/71 | HR 72 | Ht 73.0 in | Wt 296.0 lb

## 2014-05-10 DIAGNOSIS — I5032 Chronic diastolic (congestive) heart failure: Secondary | ICD-10-CM

## 2014-05-10 DIAGNOSIS — I4891 Unspecified atrial fibrillation: Secondary | ICD-10-CM

## 2014-05-10 DIAGNOSIS — R0602 Shortness of breath: Secondary | ICD-10-CM

## 2014-05-10 DIAGNOSIS — N289 Disorder of kidney and ureter, unspecified: Secondary | ICD-10-CM

## 2014-05-10 DIAGNOSIS — I10 Essential (primary) hypertension: Secondary | ICD-10-CM

## 2014-05-10 DIAGNOSIS — E0821 Diabetes mellitus due to underlying condition with diabetic nephropathy: Secondary | ICD-10-CM

## 2014-05-10 NOTE — Assessment & Plan Note (Signed)
BNP drawn today. I'm concerned with recent 20 pound weight loss in 3 weeks that he will be prerenal. Suggested he back down on his torsemide as detailed

## 2014-05-10 NOTE — Assessment & Plan Note (Signed)
We have encouraged continued exercise, careful diet management in an effort to lose weight. 

## 2014-05-10 NOTE — Progress Notes (Signed)
Patient ID: Modesto CharonWilliam T Mccullers, male    DOB: [birth year], 79 y.o.   MRN: [id number]  HPI Comments: 79 year old male with past history of morbid obesity, chronic diastolic CHF, hypertension, hypercholesterolemia, diabetes with renal insufficiency, thrombocytopenia patient of, Dr. Lorin PicketScott,  episode of atrial fibrillation in early 2014.  history of COPD, obstructive sleep apnea, on CPAP.  smoked for 25 years, stopped 30 years ago. On his initial clinic visit, he was started on rate control medications for atrial fibrillation  and diuretic for significant lower extremity edema and shortness of breath. Also started on anticoagulation. elective cardioversion on October 27 2012 which was successful.   On his last clinic visit, weight was 315 pounds In followup today, his weight is down 20 pounds in 3 weeks.  He has not been following the weight scale as we have suggested he cut back on the torsemide as his weight declined. He reports that he is eating less, drinking less. He is been taking torsemide 80 mg twice a day His lower extremity edema has significantly improved. By accident he took 8 amiodarone pills yesterday instead of torsemide He reports that he has been very busy helping his daughter redo a hold Van. Perhaps with his busy days, he has been eating and drinking less. He reports he has not been sitting around the house much.  His legs are down most of the day. He does not wear compression hose   in the hospital 06/26/2013 for shortness of breath BNP 4500. He had aggressive diuresis and was -11 L through his hospital course,  ejection fraction 55-60%, creatinine 2.1 but went as high as 2.4, hemoglobin A1c 7.2. He was in normal sinus rhythm during his hospital course   History of  severe neck pain. He reports having an episode of neck pain previously that seemed to resolve after an MRI. Then he developed hip pain and was seeking care from an orthopedic specialist.  He reports being compliant with his  CPAP  Echocardiogram  shows normal ejection fraction with moderately dilated left atrium, high normal right ventricular systolic pressure   EKG today shows normal sinus rhythm with rate 72 beats per minute, right bundle branch block, no other significant ST or T wave changes  Past Medical History . Arthritis  . 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 Sleep apnea  . COPD (chronic obstructive pulmonary disease)  . Hypercholesterolemia  . Hypertension  . Thrombocytopenia  . Renal insufficiency  . Diverticulosis  . History of colon polyps    Outpatient Encounter Prescriptions as of 05/10/2014  Medication Sig  . amiodarone (PACERONE) 200 MG tablet Take 1 tablet (200 mg total) by mouth daily.  Marland Kitchen. amLODipine (NORVASC) 10 MG tablet Take 1 tablet (10 mg total) by mouth daily.  Marland Kitchen. atorvastatin (LIPITOR) 20 MG tablet Take 1 tablet (20 mg total) by mouth daily.  Marland Kitchen. BAYER CONTOUR NEXT TEST test strip TEST BLOOD SUGAR 3 TIMES A DAY  . budesonide-formoterol (SYMBICORT) 80-4.5 MCG/ACT inhaler Inhale 2 puffs into the lungs 2 (two) times daily.  . carvedilol (COREG) 6.25 MG tablet Take 6.25 mg by mouth 2 (two) times daily with a meal.  . cloNIDine (CATAPRES) 0.1 MG tablet TAKE 1 TABLET (0.1 MG TOTAL) BY MOUTH 2 (TWO) TIMES DAILY.  Marland Kitchen. DEXILANT 60 MG capsule TAKE 1 CAPSULE BY MOUTH ONCE A DAY  . escitalopram (LEXAPRO) 10 MG tablet TAKE 1/2 TABLET BY MOUTH ONCE A DAY  . hyoscyamine (LEVBID)  0.375 MG 12 hr tablet TAKE 1 TABLET (0.375 MG TOTAL) BY MOUTH 2 (TWO) TIMES DAILY.  Marland Kitchen. insulin glargine (LANTUS) 100 UNIT/ML injection TAKE 26 UNITS IN THE AM AND 35 UNITS IN THE PM.  . insulin lispro (HUMALOG) 100 UNIT/ML injection INJECT 22 UNITS AT BREAKFAST, 20 UNITS AT LUNCH & 12 UNITS AT SUPPER  . levalbuterol (XOPENEX HFA) 45 MCG/ACT inhaler Inhale 2 puffs into the lungs every 4 (four) hours as needed for wheezing.  . metolazone (ZAROXOLYN) 5 MG tablet Take 1 tablet (5 mg total) by mouth daily  as needed.  . potassium chloride SA (K-DUR,KLOR-CON) 20 MEQ tablet Take 20 mEq by mouth daily.   . pregabalin (LYRICA) 75 MG capsule TAKE ONE CAPSULE BY MOUTH TWICE A DAY  . Tamsulosin HCl (FLOMAX) 0.4 MG CAPS Take 0.4 mg by mouth daily.   Marland Kitchen. tiZANidine (ZANAFLEX) 4 MG tablet Take 0.5 mg by mouth at bedtime as needed for muscle spasms.  Marland Kitchen. torsemide (DEMADEX) 20 MG tablet Take 4 tablets (80 mg total) by mouth 2 (two) times daily.  Marland Kitchen. zoster vaccine live, PF, (ZOSTAVAX) [id number] UNT/0.65ML injection Inject 19,400 Units into the skin once.    Review of Systems  Constitutional: Negative.   HENT: Negative.   Eyes: Negative.   Respiratory: Negative.   Cardiovascular: Positive for leg swelling.  Gastrointestinal: Negative.   Endocrine: Negative.   Musculoskeletal: Negative.   Skin: Negative.   Allergic/Immunologic: Negative.   Neurological: Negative.   Hematological: Negative.   Psychiatric/Behavioral: Positive for sleep disturbance.  All other systems reviewed and are negative.   BP 157/71  Pulse 72  Ht 6\' 1"  (1.854 m)  Wt 296 lb (134.265 kg)  BMI 39.06 kg/m2  Physical Exam  Nursing note and vitals reviewed. Constitutional: He is oriented to person, place, and time. He appears well-developed and well-nourished.  Morbidly obese  HENT:  Head: Normocephalic.  Nose: Nose normal.  Mouth/Throat: Oropharynx is clear and moist.  Eyes: Conjunctivae are normal. Pupils are equal, round, and reactive to light.  Neck: Normal range of motion. Neck supple. No JVD present.  Cardiovascular: Normal rate, regular rhythm, S1 normal, S2 normal, normal heart sounds and intact distal pulses.  Exam reveals no gallop and no friction rub.   No murmur heard. 1+ pitting edema to below the knees  Pulmonary/Chest: Effort normal and breath sounds normal. No respiratory distress. He has no wheezes. He has no rales. He exhibits no tenderness.  Abdominal: Soft. Bowel sounds are normal. He exhibits no distension.  There is no tenderness.  Musculoskeletal: Normal range of motion. He exhibits no edema and no tenderness.  Lymphadenopathy:    He has no cervical adenopathy.  Neurological: He is alert and oriented to person, place, and time. Coordination normal.  Skin: Skin is warm and dry. No rash noted. No erythema.  Psychiatric: He has a normal mood and affect. His behavior is normal. Judgment and thought content normal.      Assessment and Plan

## 2014-05-10 NOTE — Assessment & Plan Note (Signed)
He reports some improvement in his breathing with aggressive diuresis. Still has mild shortness of breath with exertion likely secondary to obesity and deconditioning

## 2014-05-10 NOTE — Assessment & Plan Note (Signed)
Blood pressure  mildly elevated today. this metabolic panel pending from today. No new medications added. We'll watch for his labs first before any changes made

## 2014-05-10 NOTE — Patient Instructions (Addendum)
Your next appointment will be scheduled in our new office located at :  [facility]  [address], [address]  [place], [place] [id number]  You are doing well.  Please cut the torsemide down to 4 in the Am and 2 in the PM  Cut the amlodipine in 1/2 daily  We will check your labs today  Please call us if you have new issues that need to be addressed before your next appt.  Your physician wants you to follow-up in: 1 month.

## 2014-05-10 NOTE — Assessment & Plan Note (Addendum)
Dramatic weight loss in the past 3 weeks. We will check a basic metabolic panel today. We have suggested he cut back on his torsemide down to 40 mg twice a day. He would prefer to stay on 80 mg in the morning and 40 mg in the evening

## 2014-05-11 LAB — BASIC METABOLIC PANEL
BUN/Creatinine Ratio: 19 (ref 10–22)
BUN: 56 mg/dL — AB (ref 8–27)
CHLORIDE: 89 mmol/L — AB (ref 97–108)
CO2: 32 mmol/L — ABNORMAL HIGH (ref 18–29)
Calcium: 8.6 mg/dL (ref 8.6–10.2)
Creatinine, Ser: 2.98 mg/dL — ABNORMAL HIGH (ref 0.76–1.27)
GFR calc Af Amer: 22 mL/min/{1.73_m2} — ABNORMAL LOW (ref >59)
GFR calc non Af Amer: 19 mL/min/{1.73_m2} — ABNORMAL LOW (ref >59)
GLUCOSE: 264 mg/dL — AB (ref 65–99)
POTASSIUM: 3.5 mmol/L (ref 3.5–5.2)
Sodium: 141 mmol/L (ref 134–144)

## 2014-05-15 ENCOUNTER — Telehealth: Payer: Self-pay

## 2014-05-15 NOTE — Telephone Encounter (Signed)
Pt aware of lab results and Dr.Gollan's instructions with verbal understanding. Significant dehydration based on recent lab work.    Would follow the diuretic regimen below. This was detailed 2 office visits ago:    For weight <300, take torsemide 40 mg once a day    For weight 300 to 305, take torsemide 40 mg twice a day    For weight 306 to 310, take torsemide 80 mg twice a day    For weight >310, take metolazone 5 mg in the Am, 30 minutes later, take torsemide 80 mg , take torsemide 80 again in the PM   Pt sts that his weight this morning was 294lb.pt denies sob, swelling. Pt sts that he has had good urine output Pt sts that he has had not had a bowel movement since 10/20 Pt has used several enema's and they were not successful Adv pt to f/u with his pcp today about constipation Pt verbalized understanding.

## 2014-05-22 ENCOUNTER — Telehealth: Payer: Self-pay

## 2014-05-22 MED ORDER — CARVEDILOL 6.25 MG PO TABS: 6.2500 mg | ORAL_TABLET | Freq: Two times a day (BID) | ORAL | Status: DC

## 2014-05-22 NOTE — Telephone Encounter (Signed)
Spoke w/ pt.  Clarified that pt is aware of his meds and no changes were made at last ov.  Pt reports a 9lb wt gain since last ov, w/ increasing ankle edema.  Pt was previously taking torsemide 80 mg BID. This dose was decreased at last ov: "Please cut the torsemide down to 4 in the Am and 2 in the PM". Advised pt to take 4 this evening   He is agreeable to this, but would like to know how long he should do this.  Please advise.  Thank you.

## 2014-05-22 NOTE — Telephone Encounter (Signed)
Pt called states that he is taking Carvedilol and Clonidine, he thinks these are the same. I informed him these were not, but a nurse would call and explain the difference

## 2014-05-23 NOTE — Telephone Encounter (Signed)
Would follow the guidelines written in the AVS not on his last clinic visit but the one before If he over dries himself, renal function will get worse He will have ankle edema on a regular basis unless he dries himself up to much Would follow the guidelines as given to him before 80 mg twice a day of torsemide was too much on his last clinic visit given his renal function was worse, much worse

## 2014-05-24 NOTE — Telephone Encounter (Signed)
Spoke w/ pt.  Advised him of Dr. Windell HummingbirdGollan's recommendation.  He reports that he has gone from 304 down to 297lbs. He reports that he has been following instructions from previous ov and is back to his original dosing of 4 torsemide in am and 2 in pm.  Asked pt to call back if he has any further questions or concerns.

## 2014-05-26 ENCOUNTER — Other Ambulatory Visit: Payer: Self-pay | Admitting: Internal Medicine

## 2014-05-29 ENCOUNTER — Other Ambulatory Visit: Payer: Self-pay | Admitting: Internal Medicine

## 2014-05-29 NOTE — Telephone Encounter (Signed)
Refilled lyrica #[id number] with one refill.   

## 2014-05-29 NOTE — Telephone Encounter (Signed)
Last OV 9.8.15, last refill 10.14.15.  Please advise refill

## 2014-05-29 NOTE — Telephone Encounter (Signed)
Pt advised Rx ready for pick up

## 2014-05-31 ENCOUNTER — Telehealth: Payer: Self-pay | Admitting: Cardiovascular Disease

## 2014-05-31 ENCOUNTER — Telehealth: Payer: Self-pay | Admitting: Internal Medicine

## 2014-05-31 NOTE — Telephone Encounter (Signed)
FYI, sent to ED by [facility]

## 2014-05-31 NOTE — Telephone Encounter (Signed)
Please confirm pt did go to ER.  Thanks.

## 2014-05-31 NOTE — Telephone Encounter (Signed)
Spoke w/ pt.  He reports that he woke up yesterday w/ excruciating pain, "I can't tell if it's my liver or my kidney that hurts". He states that he would prefer to come to our office b/c he was under the impression that we processed our labs in house.  Advised pt to contact his PCP.  He is agreeable and will call back if we can be of further assistance.

## 2014-05-31 NOTE — Telephone Encounter (Signed)
Spoke with pt, had not gone to ED yet. Advised pt to be seen as pain is persisting, states he will have wife take him to ED to be seen. Requested call back after visit to discuss need for follow up.  verbalized understanding

## 2014-05-31 NOTE — Telephone Encounter (Signed)
Pt is calling wanting to get some blood work done, for states he has done changes to his meds that we need to make sure what he is taking is correct. Pt also stated he is in pain and would like us to call soon. He does not want to go to his primary care doctor but will if we can't help.

## 2014-05-31 NOTE — Telephone Encounter (Signed)
Patient Information:  Caller Name: Chrissie NoaWilliam  Phone: [phone]  Patient: [name], [name]  Gender: Male  DOB: Oct 16, 1934  Age: 79 Years  PCP: Dale DurhamScott, Charlene  Office Follow Up:  Does the office need to follow up with this patient?: No  Instructions For The Office: N/A  RN Note:  Constant mid right back and flank pain.  Pain rated 5/10 when sitting still and 10/10 if moves, stands or takes a deep breath.  Pain makes it extremely difficult to sit or stand.  Denies trauma. FBS 160 at 0800.  BP 160/5/ P 71.  Asking for pneumonia vaccine and lab to check kidney function. No blood in urine.  Voiding well due to Torsemide; dose recently reduced.  Constipation present for past week.  Has small stool 05/30/14,  Taking stool softerner.  Used a Ducolax suppository 05/30/14 with minimal results.  [facility] or [facility].  Pt declined appointment in [place] offices.  RN called Dr Lorin PicketScott regarding dispostion.  Per Toya/office nurse, OK to go [facility] now.  Verbalized understanding.   Symptoms  Reason For Call & Symptoms: Constant right mid back pain behind rib cage; concerned about liver or kidney damage due to use of Torsemide  Reviewed Health History In EMR: Yes  Reviewed Medications In EMR: Yes  Reviewed Allergies In EMR: Yes  Reviewed Surgeries / Procedures: Yes  Date of Onset of Symptoms: 05/23/2014  Guideline(s) Used:  Flank Pain  Disposition Per Guideline:   Go to ED Now  Reason For Disposition Reached:   Severe pain (e.g., excruciating, scale 8-10) and present > 1 hour  Advice Given:  N/A  RN Overrode Recommendation:  Go To ED  Approved per [facility] office nurse

## 2014-06-01 ENCOUNTER — Inpatient Hospital Stay: Payer: Self-pay | Admitting: Internal Medicine

## 2014-06-01 DIAGNOSIS — N183 Chronic kidney disease, stage 3 (moderate): Secondary | ICD-10-CM

## 2014-06-01 DIAGNOSIS — I5032 Chronic diastolic (congestive) heart failure: Secondary | ICD-10-CM

## 2014-06-01 DIAGNOSIS — J441 Chronic obstructive pulmonary disease with (acute) exacerbation: Secondary | ICD-10-CM

## 2014-06-01 DIAGNOSIS — R079 Chest pain, unspecified: Secondary | ICD-10-CM

## 2014-06-01 LAB — CBC AND DIFFERENTIAL
HCT: 30 % — AB (ref 41–53)
HEMOGLOBIN: 10.1 g/dL — AB (ref 13.5–17.5)
Platelets: 111 10*3/uL — AB (ref 150–399)
WBC: 9.9 10*3/mL

## 2014-06-01 LAB — COMPREHENSIVE METABOLIC PANEL
ALBUMIN: 3.1 g/dL — AB (ref 3.4–5.0)
Alkaline Phosphatase: 104 U/L
Anion Gap: 10 (ref 7–16)
BUN: 63 mg/dL — ABNORMAL HIGH (ref 7–18)
Bilirubin,Total: 0.6 mg/dL (ref 0.2–1.0)
CO2: 35 mmol/L — AB (ref 21–32)
CREATININE: 3.36 mg/dL — AB (ref 0.60–1.30)
Calcium, Total: 7.8 mg/dL — ABNORMAL LOW (ref 8.5–10.1)
Chloride: 93 mmol/L — ABNORMAL LOW (ref 98–107)
EGFR (Non-African Amer.): 19 — ABNORMAL LOW
GFR CALC AF AMER: 23 — AB
GLUCOSE: 236 mg/dL — AB (ref 65–99)
Osmolality: 301 (ref 275–301)
Potassium: 3 mmol/L — ABNORMAL LOW (ref 3.5–5.1)
SGOT(AST): 19 U/L (ref 15–37)
SGPT (ALT): 17 U/L
Sodium: 138 mmol/L (ref 136–145)
Total Protein: 7 g/dL (ref 6.4–8.2)

## 2014-06-01 LAB — URINALYSIS, COMPLETE
Bacteria: NONE SEEN
Bilirubin,UR: NEGATIVE
Glucose,UR: NEGATIVE mg/dL (ref 0–75)
Hyaline Cast: 23
KETONE: NEGATIVE
LEUKOCYTE ESTERASE: NEGATIVE
Nitrite: NEGATIVE
PH: 5 (ref 4.5–8.0)
Protein: 30
SPECIFIC GRAVITY: 1.01 (ref 1.003–1.030)
Squamous Epithelial: <1
WBC UR: 1 /HPF (ref 0–5)

## 2014-06-01 LAB — CBC
HCT: 29.7 % — AB (ref 40.0–52.0)
HGB: 10.1 g/dL — ABNORMAL LOW (ref 13.0–18.0)
MCH: 29.9 pg (ref 26.0–34.0)
MCHC: 33.9 g/dL (ref 32.0–36.0)
MCV: 88 fL (ref 80–100)
Platelet: 111 10*3/uL — ABNORMAL LOW (ref 150–440)
RBC: 3.37 10*6/uL — ABNORMAL LOW (ref 4.40–5.90)
RDW: 13.8 % (ref 11.5–14.5)
WBC: 9.9 10*3/uL (ref 3.8–10.6)

## 2014-06-01 LAB — BASIC METABOLIC PANEL
BUN: 63 mg/dL — AB (ref 4–21)
Creatinine: 3.4 mg/dL — AB (ref 0.6–1.3)
GLUCOSE: 236138 mg/dL
POTASSIUM: 3 mmol/L — AB (ref 3.4–5.3)
Sodium: 138 mmol/L (ref 137–147)

## 2014-06-01 LAB — HEPATIC FUNCTION PANEL
ALT: 17 U/L (ref 10–40)
AST: 19 U/L (ref 14–40)
Alkaline Phosphatase: 104 U/L (ref 25–125)
Bilirubin, Total: 0.6 mg/dL

## 2014-06-01 LAB — PRO B NATRIURETIC PEPTIDE: B-Type Natriuretic Peptide: 731 pg/mL — ABNORMAL HIGH (ref 0–450)

## 2014-06-01 LAB — LIPASE, BLOOD: Lipase: 56 U/L — ABNORMAL LOW (ref 73–393)

## 2014-06-01 LAB — TROPONIN I: Troponin-I: 0.04 ng/mL

## 2014-06-02 LAB — BASIC METABOLIC PANEL
Anion Gap: 10 (ref 7–16)
BUN: 79 mg/dL — ABNORMAL HIGH (ref 7–18)
BUN: 79 mg/dL — AB (ref 4–21)
CALCIUM: 8 mg/dL — AB (ref 8.5–10.1)
CHLORIDE: 91 mmol/L — AB (ref 98–107)
CO2: 32 mmol/L (ref 21–32)
Creatinine: 3.9 mg/dL — AB (ref 0.6–1.3)
Creatinine: 3.91 mg/dL — ABNORMAL HIGH (ref 0.60–1.30)
EGFR (African American): 19 — ABNORMAL LOW
EGFR (Non-African Amer.): 16 — ABNORMAL LOW
GLUCOSE: 374 mg/dL
Glucose: 374 mg/dL — ABNORMAL HIGH (ref 65–99)
Osmolality: 305 (ref 275–301)
POTASSIUM: 3.4 mmol/L (ref 3.4–5.3)
POTASSIUM: 3.4 mmol/L — AB (ref 3.5–5.1)
Sodium: 133 mmol/L — AB (ref 137–147)
Sodium: 133 mmol/L — ABNORMAL LOW (ref 136–145)

## 2014-06-02 LAB — CBC WITH DIFFERENTIAL/PLATELET
Basophil #: 0 10*3/uL (ref 0.0–0.1)
Basophil %: 0.1 %
EOS PCT: 0 %
Eosinophil #: 0 10*3/uL (ref 0.0–0.7)
HCT: 26.8 % — AB (ref 40.0–52.0)
HGB: 9.1 g/dL — AB (ref 13.0–18.0)
LYMPHS ABS: 0.2 10*3/uL — AB (ref 1.0–3.6)
LYMPHS PCT: 3.6 %
MCH: 29.9 pg (ref 26.0–34.0)
MCHC: 34 g/dL (ref 32.0–36.0)
MCV: 88 fL (ref 80–100)
Monocyte #: 0.4 x10 3/mm (ref 0.2–1.0)
Monocyte %: 6.7 %
NEUTROS ABS: 5.2 10*3/uL (ref 1.4–6.5)
NEUTROS PCT: 89.6 %
Platelet: 90 10*3/uL — ABNORMAL LOW (ref 150–440)
RBC: 3.04 10*6/uL — ABNORMAL LOW (ref 4.40–5.90)
RDW: 13.9 % (ref 11.5–14.5)
WBC: 5.8 10*3/uL (ref 3.8–10.6)

## 2014-06-02 LAB — CBC AND DIFFERENTIAL
HCT: 37 % — AB (ref 41–53)
Hemoglobin: 9.1 g/dL — AB (ref 13.5–17.5)
Platelets: 90 10*3/uL — AB (ref 150–399)
WBC: 5.8 10^3/mL

## 2014-06-02 LAB — MAGNESIUM: Magnesium: 1.7 mg/dL — ABNORMAL LOW

## 2014-06-03 LAB — BASIC METABOLIC PANEL
Anion Gap: 11 (ref 7–16)
BUN: 91 mg/dL — AB (ref 4–21)
BUN: 91 mg/dL — AB (ref 7–18)
CALCIUM: 8.1 mg/dL — AB (ref 8.5–10.1)
CHLORIDE: 92 mmol/L — AB (ref 98–107)
Co2: 32 mmol/L (ref 21–32)
Creatinine: 3.55 mg/dL — ABNORMAL HIGH (ref 0.60–1.30)
Creatinine: 3.6 mg/dL — AB (ref 0.6–1.3)
EGFR (African American): 22 — ABNORMAL LOW
EGFR (Non-African Amer.): 18 — ABNORMAL LOW
Glucose: 314 mg/dL
Glucose: 314 mg/dL — ABNORMAL HIGH (ref 65–99)
OSMOLALITY: 310 (ref 275–301)
POTASSIUM: 3.6 mmol/L (ref 3.4–5.3)
Potassium: 3.6 mmol/L (ref 3.5–5.1)
Sodium: 135 mmol/L — AB (ref 137–147)
Sodium: 135 mmol/L — ABNORMAL LOW (ref 136–145)

## 2014-06-06 ENCOUNTER — Encounter: Payer: Self-pay | Admitting: Internal Medicine

## 2014-06-06 ENCOUNTER — Ambulatory Visit (INDEPENDENT_AMBULATORY_CARE_PROVIDER_SITE_OTHER): Payer: Medicare Other | Admitting: Internal Medicine

## 2014-06-06 VITALS — BP 130/60 | HR 53 | Temp 98.4°F | Ht 73.0 in | Wt 302.5 lb

## 2014-06-06 DIAGNOSIS — N184 Chronic kidney disease, stage 4 (severe): Secondary | ICD-10-CM

## 2014-06-06 DIAGNOSIS — I5032 Chronic diastolic (congestive) heart failure: Secondary | ICD-10-CM

## 2014-06-06 DIAGNOSIS — E0821 Diabetes mellitus due to underlying condition with diabetic nephropathy: Secondary | ICD-10-CM

## 2014-06-06 DIAGNOSIS — R0602 Shortness of breath: Secondary | ICD-10-CM

## 2014-06-06 DIAGNOSIS — R600 Localized edema: Secondary | ICD-10-CM

## 2014-06-06 DIAGNOSIS — D696 Thrombocytopenia, unspecified: Secondary | ICD-10-CM

## 2014-06-06 DIAGNOSIS — G4733 Obstructive sleep apnea (adult) (pediatric): Secondary | ICD-10-CM

## 2014-06-06 DIAGNOSIS — E7800 Pure hypercholesterolemia, unspecified: Secondary | ICD-10-CM

## 2014-06-06 DIAGNOSIS — I10 Essential (primary) hypertension: Secondary | ICD-10-CM

## 2014-06-06 DIAGNOSIS — I4891 Unspecified atrial fibrillation: Secondary | ICD-10-CM

## 2014-06-06 NOTE — Progress Notes (Signed)
Pre visit review using our clinic review tool, if applicable. No additional management support is needed unless otherwise documented below in the visit note. 

## 2014-06-07 ENCOUNTER — Encounter: Payer: Self-pay | Admitting: Internal Medicine

## 2014-06-07 ENCOUNTER — Ambulatory Visit (INDEPENDENT_AMBULATORY_CARE_PROVIDER_SITE_OTHER): Payer: Medicare Other | Admitting: Cardiovascular Disease

## 2014-06-07 ENCOUNTER — Encounter: Payer: Self-pay | Admitting: Cardiovascular Disease

## 2014-06-07 VITALS — BP 160/60 | HR 57 | Ht 73.0 in | Wt 302.5 lb

## 2014-06-07 DIAGNOSIS — R600 Localized edema: Secondary | ICD-10-CM

## 2014-06-07 DIAGNOSIS — I10 Essential (primary) hypertension: Secondary | ICD-10-CM

## 2014-06-07 DIAGNOSIS — N184 Chronic kidney disease, stage 4 (severe): Secondary | ICD-10-CM

## 2014-06-07 DIAGNOSIS — J441 Chronic obstructive pulmonary disease with (acute) exacerbation: Secondary | ICD-10-CM

## 2014-06-07 DIAGNOSIS — R609 Edema, unspecified: Secondary | ICD-10-CM

## 2014-06-07 DIAGNOSIS — R0602 Shortness of breath: Secondary | ICD-10-CM

## 2014-06-07 DIAGNOSIS — I5032 Chronic diastolic (congestive) heart failure: Secondary | ICD-10-CM

## 2014-06-07 NOTE — Assessment & Plan Note (Signed)
Lower extremity edema likely secondary to venous insufficiency. He has persistent edema despite recent dehydration. Likely exacerbated by his obesity. Recommended compression hose, leg elevation

## 2014-06-07 NOTE — Assessment & Plan Note (Addendum)
He reports that he Has follow-up with renal. Unclear who he is seen or what day Creatinine up to 3 with overdiuresis, baseline likely less than 2

## 2014-06-07 NOTE — Assessment & Plan Note (Signed)
Recent COPD exacerbation, admission to the hospital, requiring antibiotics and now on oxygen at home. Previous hypoxia likely the cause of him taking excessive torsemide for shortness of breath symptoms

## 2014-06-07 NOTE — Assessment & Plan Note (Signed)
Blood pressure is well controlled on today's visit. No changes made to the medications. 

## 2014-06-07 NOTE — Progress Notes (Signed)
Patient ID: Robert CharonWilliam T Rollins, male    DOB: [birth year], 79 y.o.   MRN: [id number]  HPI Comments: 79 year old male with past history of morbid obesity, chronic diastolic CHF, hypertension, hypercholesterolemia, diabetes with renal insufficiency, thrombocytopenia patient of Dr. Lorin Rollins,  episode of atrial fibrillation in early 2014.  history of COPD, obstructive sleep apnea, on CPAP.  smoked for 25 years, stopped 30 years ago. On his initial clinic visit, he was started on rate control medications for atrial fibrillation  and diuretic for significant lower extremity edema and shortness of breath. Also started on anticoagulation. elective cardioversion on October 27 2012 which was successful. He presents today for follow-up of his acute on chronic diastolic CHF  Recent admission to the hospital November 12 with discharge 06/03/2014. He was admitted with shortness of breath, weakness, flank pain. His creatinine was elevated, greater than 3 consistent with dehydration from overuse of torsemide. He had a cough with shortness of breath and was started on antibiotics for bronchitis. Symptoms slowly improved. Of note he had lower extremity edema in the hospital despite dehydration He was started on oxygen for significant hypoxia. Saturations into the mid to low 80s on room air, worse with exertion  Since his discharge home, he reports weight is 294 pounds. Weight on today's visit is 302.8   pounds . He reports that his breathing is stable on oxygen. He presents to the office today with no oxygen. He is sleeping with the oxygen and uses this around the house. He does have portable tanks and a generator. He continues to have lower extremity edema. He is not taking torsemide at this time. He was told to stop this by the hospital. reports that he has been drinking less fluids at home Typically does not wear compression hose  EKG in the office today shows sinus bradycardia with rate 57 bpm, right bundle branch block   in the hospital 06/26/2013 for shortness of breath BNP 4500. He had aggressive diuresis and was -11 L through his hospital course,  ejection fraction 55-60%, creatinine 2.1 but went as high as 2.4, hemoglobin A1c 7.2. He was in normal sinus rhythm during his hospital course   History of  severe neck pain. He reports having an episode of neck pain previously that seemed to resolve after an MRI. Then he developed hip pain and was seeking care from an orthopedic specialist.  He reports being compliant with his CPAP  Echocardiogram  shows normal ejection fraction with moderately dilated left atrium, high normal right ventricular systolic pressure  Past Medical History . Arthritis  . 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 Sleep apnea  . COPD (chronic obstructive pulmonary disease)  . Hypercholesterolemia  . Hypertension  . Thrombocytopenia  . Renal insufficiency  . Diverticulosis  . History of colon polyps    Outpatient Encounter Prescriptions as of 06/07/2014  Medication Sig  . atorvastatin (LIPITOR) 20 MG tablet Take 1 tablet (20 mg total) by mouth daily.  Marland Kitchen. BAYER CONTOUR NEXT TEST test strip TEST BLOOD SUGAR 3 TIMES A DAY  . DEXILANT 60 MG capsule TAKE 1 CAPSULE BY MOUTH ONCE A DAY  . escitalopram (LEXAPRO) 10 MG tablet TAKE 1/2 TABLET BY MOUTH ONCE A DAY  . hyoscyamine (LEVBID) 0.375 MG 12 hr tablet TAKE 1 TABLET (0.375 MG TOTAL) BY MOUTH 2 (TWO) TIMES DAILY.  Marland Kitchen. insulin glargine (LANTUS) 100 UNIT/ML injection TAKE 26 UNITS IN THE AM AND 35 UNITS IN THE  PM.  . insulin lispro (HUMALOG) 100 UNIT/ML injection INJECT 22 UNITS AT BREAKFAST, 20 UNITS AT LUNCH & 12 UNITS AT SUPPER  . LYRICA 75 MG capsule TAKE ONE CAPSULE BY MOUTH TWICE A DAY  . metolazone (ZAROXOLYN) 5 MG tablet Take 1 tablet (5 mg total) by mouth daily as needed.  . OXYGEN 3 Liters daily  . predniSONE (DELTASONE) 10 MG tablet Take 10 mg by mouth daily with breakfast.   . PROAIR HFA 108 (90 BASE)  MCG/ACT inhaler Inhale 2 puffs into the lungs every morning.   Marland Kitchen. SPIRIVA RESPIMAT 2.5 MCG/ACT AERS daily.   . SYMBICORT 80-4.5 MCG/ACT inhaler INHALE 2 PUFFS INTO THE LUNGS 2 (TWO) TIMES DAILY.  . Tamsulosin HCl (FLOMAX) 0.4 MG CAPS Take 0.4 mg by mouth daily.   [name]. XOPENEX HFA 45 MCG/ACT inhaler INHALE 2 PUFFS EVERY 4 HOURS AS NEEDED FOR WHEEZING  . zoster vaccine live, PF, (ZOSTAVAX) [id number] UNT/0.65ML injection Inject 19,400 Units into the skin once.  . [DISCONTINUED] HUMALOG 100 UNIT/ML injection INJECT 24 UNITS AT BREAKFAST, 22 UNITS AT LUNCH & 14 UNITS AT SUPPER   Social history  reports that he quit smoking about 29 years ago. His smoking use included Cigarettes. He has a 25 pack-year smoking history. He has never used smokeless tobacco. He reports that he does not drink alcohol or use illicit drugs.  Review of Systems  Constitutional: Negative.   Respiratory: Positive for shortness of breath.   Cardiovascular: Positive for leg swelling.  Gastrointestinal: Negative.   Endocrine: Negative.   Musculoskeletal: Negative.   Neurological: Negative.   Hematological: Negative.   Psychiatric/Behavioral: Positive for sleep disturbance.  All other systems reviewed and are negative.   BP 160/60 mmHg  Pulse 57  Ht 6\' 1"  (1.854 m)  Wt 302 lb 8 oz (137.213 kg)  BMI 39.92 kg/m2  Physical Exam  Constitutional: He is oriented to person, place, and time. He appears well-developed and well-nourished.  Obese  HENT:  Head: Normocephalic.  Nose: Nose normal.  Mouth/Throat: Oropharynx is clear and moist.  Eyes: Conjunctivae are normal. Pupils are equal, round, and reactive to light.  Neck: Normal range of motion. Neck supple. No JVD present.  Cardiovascular: Normal rate, regular rhythm, S1 normal, S2 normal, normal heart sounds and intact distal pulses.  Exam reveals no gallop and no friction rub.   No murmur heard. 1+ pitting edema bilaterally to below the knees  Pulmonary/Chest: Effort normal  and breath sounds normal. No respiratory distress. He has no wheezes. He has no rales. He exhibits no tenderness.  Abdominal: Soft. Bowel sounds are normal. He exhibits no distension. There is no tenderness.  Musculoskeletal: Normal range of motion. He exhibits edema. He exhibits no tenderness.  Lymphadenopathy:    He has no cervical adenopathy.  Neurological: He is alert and oriented to person, place, and time. Coordination normal.  Skin: Skin is warm and dry. No rash noted. No erythema.  Psychiatric: He has a normal mood and affect. His behavior is normal. Judgment and thought content normal.      Assessment and Plan   Nursing note and vitals reviewed.

## 2014-06-07 NOTE — Patient Instructions (Addendum)
You are doing well.  For constipation, take miralex/MOM  For weight less than 297, no torsemide For weight 297 to 300, take torsemide 20 to 40 mg once a day For weight 301 to 305, take torsemide 40 mg in the Am and 20 to 40 mg in the PM For weight 306 to 310, take torsemide 80 mg twice a day For weight >310, take metolazone 5 mg in the Am, 30 minutes later, take torsemide 80 mg , take torsemide 80 again in the PM  Please call us if you have new issues that need to be addressed before your next appt.  Follow up 3 months

## 2014-06-07 NOTE — Progress Notes (Signed)
Subjective:    Patient ID: [name], male    DOB: January 01, 1935, 79 y.o.   MRN: [id number]  HPI 79 year old male with past history of hypertension, hypercholesterolemia, diabetes with renal insufficiency (previously followed by Dr Hyacinth MeekerMiller) and thrombocytopenia who comes in today for a hospital follow up/scheduled follow up.  He was hospitalized last week with worsening sob secondary to congestive heart failure and weakness.  Also had increased abdominal pain.  Medications adjusted.  Off torsemide now.  Discharged two days ago.   States his leg swelling is better.  Breathing is better.   Abdomen not as tight.  Still with some sob with exertion, but overall symptoms have improved.   Also has known COPD and is followed by Dr Kendrick FriesMcQuaid.  No acid reflux.  Denies any chest pain.  No increased heart rate or palpitations.  No increased cough or congestion.  Brought in no blood sugar readings, but states overall blood sugars have been doing ok.  Increased recently secondary to steroid treatment.  trending back down now.  States sugars now running 150-230.  No low blood sugars recently.  Only on insulin now.  Off all oral diabetic medications.  Was having increased problems with constipation.  Since discharge, better.  Has had three "good bowel movements" since his discharge.           Past Medical History  Diagnosis Date  . Arthritis   . Sleep apnea     a. On CPAP; b. Severe OSA by sleep study 01/2014.  Marland Kitchen. COPD (chronic obstructive pulmonary disease)   . Hypercholesterolemia   . Hypertension   . Thrombocytopenia   . CKD (chronic kidney disease), stage III   . Diverticulosis   . History of colon polyps   . Type II diabetes mellitus   . Chronic diastolic CHF (congestive heart failure), NYHA class 3     a. 06/2013 Echo: EF 55-60%, mild conc LVH, mildly dil LA/RA, mild-mod Ao sclerosis w/o stenosis;  b. 02/2014 Echo: EF 55-60%, mild conc LVH, mildly dil LA/RA, mild-mod Ao sclerosis w/o stenosis.  . Chest  pain     a. 01/2013 Stress only myoview: low risk->Med Rx.  Marland Kitchen. PAF (paroxysmal atrial fibrillation)     a. On amio.  Previously on Xarelto but self discontinued 2/2 concern r/t swelling.    Current Outpatient Prescriptions on File Prior to Visit  Medication Sig Dispense Refill  . atorvastatin (LIPITOR) 20 MG tablet Take 1 tablet (20 mg total) by mouth daily. 90 tablet 1  . BAYER CONTOUR NEXT TEST test strip TEST BLOOD SUGAR 3 TIMES A DAY 300 each 3  . DEXILANT 60 MG capsule TAKE 1 CAPSULE BY MOUTH ONCE A DAY 30 capsule 1  . escitalopram (LEXAPRO) 10 MG tablet TAKE 1/2 TABLET BY MOUTH ONCE A DAY 15 tablet 2  . HUMALOG 100 UNIT/ML injection INJECT 24 UNITS AT BREAKFAST, 22 UNITS AT LUNCH & 14 UNITS AT SUPPER 30 mL 3  . hyoscyamine (LEVBID) 0.375 MG 12 hr tablet TAKE 1 TABLET (0.375 MG TOTAL) BY MOUTH 2 (TWO) TIMES DAILY. 60 tablet 2  . insulin glargine (LANTUS) 100 UNIT/ML injection TAKE 26 UNITS IN THE AM AND 35 UNITS IN THE PM.    . insulin lispro (HUMALOG) 100 UNIT/ML injection INJECT 22 UNITS AT BREAKFAST, 20 UNITS AT LUNCH & 12 UNITS AT SUPPER    . LYRICA 75 MG capsule TAKE ONE CAPSULE BY MOUTH TWICE A DAY 60 capsule 1  . metolazone (  ZAROXOLYN) 5 MG tablet Take 1 tablet (5 mg total) by mouth daily as needed. 30 tablet 1  . SYMBICORT 80-4.5 MCG/ACT inhaler INHALE 2 PUFFS INTO THE LUNGS 2 (TWO) TIMES DAILY. 10.2 g 5  . Tamsulosin HCl (FLOMAX) 0.4 MG CAPS Take 0.4 mg by mouth daily.     Pauline Aus HFA 45 MCG/ACT inhaler INHALE 2 PUFFS EVERY 4 HOURS AS NEEDED FOR WHEEZING 15 g 3  . zoster vaccine live, PF, (ZOSTAVAX) 16109 UNT/0.65ML injection Inject 19,400 Units into the skin once. 1 each 0   No current facility-administered medications on file prior to visit.    Review of Systems Patient denies lightheadedness or dizziness.  No sinus or allergy symptoms.  No chest pain or palpitations.  No increased heart rate.  No cough or congestion.  Still some sob with exertion, but overall better.   No  nausea or vomiting.  No abdominal pain or cramping.   Abdomen swelling and fullness better after his hospitalization.  No urine change.   Sugars have been higher with recent prednisone.  Getting better.  See above.  Have discussed the importance of eating regular meals and not going long periods without eating.  Discussed how to adjust his insulin.  Will stop [place]. Has had some issues with constipation.  Better.         Objective:   Physical Exam  Filed Vitals:   06/06/14 1456  BP: 130/60  Pulse: 53  Temp: 98.4 F (54.19 C)   79 year old male in no acute distress.  HEENT:  Nares - clear.  Oropharynx - without lesions. NECK:  No audible carotid bruit.   HEART:  Rate controlled.  Appears to be regular.    LUNGS:  No wheezing audible.  Respirations even and unlabored.  Increased air movement.   RADIAL PULSE:  Equal bilaterally.  ABDOMEN:  Nontender.  Bowel sounds present and normal.  No audible abdominal bruit.    EXTREMITIES:  Edema - stable.   Compression hose in place.       Assessment & Plan:  MSK.  S/p knee surgery.   Continues to f/u with Dr Gavin Potters.    Atrial fibrillation, unspecified Appears to be in SR now.  Follow.   Essential hypertension, benign Blood pressure doing well.   Chronic diastolic heart failure Recently admitted with sob.  Breathing is better now.  Medications have been adjusted.  Off torsemide.  Obtain hospital records for review.  Due to see Dr Mariah Milling.  Hold on making adjustments in medication at this time.  States he is off amiodarone, amlodipine and carvedilol.  Need to clarify if this is correct.    OSA (obstructive sleep apnea) Using CPAP.    Diabetes mellitus due to underlying condition with diabetic nephropathy Sugars increased as outlined with recent prednisone treatment.  Improving.  Follow sugars.  Only on insulin now.  All oral diabetic medications have been stopped.  Follow.   CKD (chronic kidney disease) stage 4, GFR 15-29 ml/min Kidney  function has worsened recently.  Due to see nephrology soon.  Follow.    Thrombocytopenia Follow.    Hypercholesterolemia On lipitor.  Follow lipid panela and liver function.    Edema extremities Better. Continue compression hose.    Shortness of breath Still some sob with exertion, but overall feels breathing is better.    Morbid obesity Diet, exercise and weight loss.       HEALTH MAINTENANCE.  Prostate and PSAs through Dr Achilles Dunk. Colonoscopy 10/21/11 -  diverticulosis and several polyps. Due follow up colonoscopy in 2016.    I spent more than 25 minutes with this patient and more than 50% of the time was spent in consultation regarding the above, specifically discussing his preadmission symptoms, his treatment and hospital course, his blood sugars, etc.

## 2014-06-07 NOTE — Assessment & Plan Note (Signed)
Shortness of breath likely multifactorial from COPD/hypoxia, diastolic CHF, morbid obesity with deconditioning

## 2014-06-07 NOTE — Assessment & Plan Note (Signed)
He is high risk of recurrent diastolic CHF. Several prior hospitalizations admissions for CHF symptoms. In the past he has not follow the algorithm below and taking excessive torsemide causing renal dysfunction  we have recommended he follow the diuretic algorithm below This can be adjusted depending on his renal function and shortness of breath symptoms  For weight less than 297, no torsemide For weight 297 to 300, take torsemide 20 to 40 mg once a day For weight 301 to 305, take torsemide 40 mg in the Am and 20 to 40 mg in the PM For weight 306 to 310, take torsemide 80 mg twice a day For weight >310, take metolazone 5 mg in the Am, 30 minutes later, take torsemide 80 mg , take torsemide 80 again in the PM

## 2014-06-07 NOTE — Assessment & Plan Note (Signed)
We have encouraged continued exercise, careful diet management in an effort to lose weight. 

## 2014-06-13 ENCOUNTER — Inpatient Hospital Stay: Payer: Self-pay | Admitting: Internal Medicine

## 2014-06-13 LAB — CBC
HCT: 29.3 % — AB (ref 40.0–52.0)
HGB: 9.7 g/dL — ABNORMAL LOW (ref 13.0–18.0)
MCH: 29.3 pg (ref 26.0–34.0)
MCHC: 32.9 g/dL (ref 32.0–36.0)
MCV: 89 fL (ref 80–100)
Platelet: 136 10*3/uL — ABNORMAL LOW (ref 150–440)
RBC: 3.29 10*6/uL — AB (ref 4.40–5.90)
RDW: 14.3 % (ref 11.5–14.5)
WBC: 17.1 10*3/uL — ABNORMAL HIGH (ref 3.8–10.6)

## 2014-06-13 LAB — COMPREHENSIVE METABOLIC PANEL
ALT: 33 U/L
AST: 36 U/L (ref 15–37)
Albumin: 2.9 g/dL — ABNORMAL LOW (ref 3.4–5.0)
Alkaline Phosphatase: 102 U/L
Anion Gap: 5 — ABNORMAL LOW (ref 7–16)
BUN: 38 mg/dL — AB (ref 7–18)
Bilirubin,Total: 0.7 mg/dL (ref 0.2–1.0)
CALCIUM: 7.9 mg/dL — AB (ref 8.5–10.1)
CREATININE: 2.23 mg/dL — AB (ref 0.60–1.30)
Chloride: 107 mmol/L (ref 98–107)
Co2: 31 mmol/L (ref 21–32)
EGFR (Non-African Amer.): 31 — ABNORMAL LOW
GFR CALC AF AMER: 37 — AB
GLUCOSE: 165 mg/dL — AB (ref 65–99)
Osmolality: 298 (ref 275–301)
Potassium: 3.6 mmol/L (ref 3.5–5.1)
SODIUM: 143 mmol/L (ref 136–145)
TOTAL PROTEIN: 6.7 g/dL (ref 6.4–8.2)

## 2014-06-13 LAB — TROPONIN I: Troponin-I: 0.08 ng/mL — ABNORMAL HIGH

## 2014-06-14 LAB — CBC WITH DIFFERENTIAL/PLATELET
Basophil #: 0 10*3/uL (ref 0.0–0.1)
Basophil %: 0.4 %
EOS ABS: 0 10*3/uL (ref 0.0–0.7)
EOS PCT: 0 %
HCT: 28 % — ABNORMAL LOW (ref 40.0–52.0)
HGB: 9.5 g/dL — ABNORMAL LOW (ref 13.0–18.0)
LYMPHS PCT: 1.5 %
Lymphocyte #: 0.2 10*3/uL — ABNORMAL LOW (ref 1.0–3.6)
MCH: 30.3 pg (ref 26.0–34.0)
MCHC: 34.1 g/dL (ref 32.0–36.0)
MCV: 89 fL (ref 80–100)
MONO ABS: 0.5 x10 3/mm (ref 0.2–1.0)
Monocyte %: 4.7 %
NEUTROS ABS: 9.6 10*3/uL — AB (ref 1.4–6.5)
Neutrophil %: 93.4 %
Platelet: 135 10*3/uL — ABNORMAL LOW (ref 150–440)
RBC: 3.15 10*6/uL — ABNORMAL LOW (ref 4.40–5.90)
RDW: 14.2 % (ref 11.5–14.5)
WBC: 10.3 10*3/uL (ref 3.8–10.6)

## 2014-06-15 LAB — BASIC METABOLIC PANEL
Anion Gap: 5 — ABNORMAL LOW (ref 7–16)
BUN: 49 mg/dL — ABNORMAL HIGH (ref 7–18)
CO2: 30 mmol/L (ref 21–32)
CREATININE: 2.11 mg/dL — AB (ref 0.60–1.30)
Calcium, Total: 9 mg/dL (ref 8.5–10.1)
Chloride: 105 mmol/L (ref 98–107)
EGFR (African American): 39 — ABNORMAL LOW
GFR CALC NON AF AMER: 33 — AB
Glucose: 125 mg/dL — ABNORMAL HIGH (ref 65–99)
Osmolality: 294 (ref 275–301)
Potassium: 4.3 mmol/L (ref 3.5–5.1)
Sodium: 140 mmol/L (ref 136–145)

## 2014-06-15 LAB — CBC WITH DIFFERENTIAL/PLATELET
Bands: 1 %
COMMENT - H1-COM1: NORMAL
HCT: 28.6 % — ABNORMAL LOW (ref 40.0–52.0)
HGB: 9.5 g/dL — ABNORMAL LOW (ref 13.0–18.0)
Lymphocytes: 2 %
MCH: 29.3 pg (ref 26.0–34.0)
MCHC: 33.1 g/dL (ref 32.0–36.0)
MCV: 87 fL (ref 80–100)
Monocytes: 17 %
Platelet: 148 10*3/uL — ABNORMAL LOW (ref 150–440)
RBC: 3.23 10*6/uL — AB (ref 4.40–5.90)
RDW: 13.9 % (ref 11.5–14.5)
Segmented Neutrophils: 80 %
WBC: 15.4 10*3/uL — ABNORMAL HIGH (ref 3.8–10.6)

## 2014-06-15 LAB — EXPECTORATED SPUTUM ASSESSMENT W REFEX TO RESP CULTURE

## 2014-06-16 ENCOUNTER — Ambulatory Visit: Payer: Self-pay | Admitting: Neurology

## 2014-06-17 LAB — CBC WITH DIFFERENTIAL/PLATELET
Basophil #: 0 10*3/uL (ref 0.0–0.1)
Basophil %: 0.1 %
Eosinophil #: 0 10*3/uL (ref 0.0–0.7)
Eosinophil %: 0 %
HCT: 27.8 % — ABNORMAL LOW (ref 40.0–52.0)
HGB: 9.3 g/dL — ABNORMAL LOW (ref 13.0–18.0)
Lymphocyte #: 0.1 10*3/uL — ABNORMAL LOW (ref 1.0–3.6)
Lymphocyte %: 1.1 %
MCH: 29.2 pg (ref 26.0–34.0)
MCHC: 33.3 g/dL (ref 32.0–36.0)
MCV: 88 fL (ref 80–100)
MONO ABS: 0.9 x10 3/mm (ref 0.2–1.0)
MONOS PCT: 11.5 %
NEUTROS PCT: 87.3 %
Neutrophil #: 7 10*3/uL — ABNORMAL HIGH (ref 1.4–6.5)
Platelet: 118 10*3/uL — ABNORMAL LOW (ref 150–440)
RBC: 3.17 10*6/uL — ABNORMAL LOW (ref 4.40–5.90)
RDW: 14 % (ref 11.5–14.5)
WBC: 8 10*3/uL (ref 3.8–10.6)

## 2014-06-17 LAB — BASIC METABOLIC PANEL
ANION GAP: 6 — AB (ref 7–16)
BUN: 59 mg/dL — AB (ref 7–18)
CO2: 31 mmol/L (ref 21–32)
Calcium, Total: 8.7 mg/dL (ref 8.5–10.1)
Chloride: 101 mmol/L (ref 98–107)
Creatinine: 2.08 mg/dL — ABNORMAL HIGH (ref 0.60–1.30)
EGFR (African American): 40 — ABNORMAL LOW
EGFR (Non-African Amer.): 33 — ABNORMAL LOW
GLUCOSE: 248 mg/dL — AB (ref 65–99)
Osmolality: 301 (ref 275–301)
Potassium: 4.6 mmol/L (ref 3.5–5.1)
Sodium: 138 mmol/L (ref 136–145)

## 2014-06-18 LAB — CULTURE, BLOOD (SINGLE)

## 2014-06-20 ENCOUNTER — Telehealth: Payer: Self-pay | Admitting: Internal Medicine

## 2014-06-20 LAB — EXPECTORATED SPUTUM ASSESSMENT W GRAM STAIN, RFLX TO RESP C

## 2014-06-20 NOTE — Telephone Encounter (Signed)
Need sputum culture results from the hospital.  Pt recent admitted.  Need asap.  Thanks.

## 2014-06-21 ENCOUNTER — Telehealth: Payer: Self-pay | Admitting: Internal Medicine

## 2014-06-21 ENCOUNTER — Telehealth: Payer: Self-pay

## 2014-06-21 MED ORDER — SULFAMETHOXAZOLE-TRIMETHOPRIM 800-160 MG PO TABS
1.0000 | ORAL_TABLET | Freq: Two times a day (BID) | ORAL | Status: DC
Start: 2014-06-21 — End: 2014-08-10

## 2014-06-21 NOTE — Telephone Encounter (Signed)
Reviewed pts sputum culture.  Dr Sampson GoonFitzgerald reviewed.  MRSA sensitive to bactrim.  Pharmacy notified to d/c zyvox and rx for bactrim DS bid x 14 days sent in.  Pt notified as well.

## 2014-06-21 NOTE — Telephone Encounter (Signed)
Placed in your folder.

## 2014-06-21 NOTE — Telephone Encounter (Signed)
Spoke w/ Vickie.  She reports that pt was recently d/c'd from the hospital w/ pneumonia and MRSA in his rt lung. Reports that pt's left hand is swollen and he has b/l LE edema. She states that pt's daughter confided that pt has not been following his doctors orders, he "says what they want to hear", he is resistant to help from his family, and requested [employer] on d/c from hospital. She states that daughter does not want to pt to know that she shared this info.  Pt has an appt w/ nephrologist next week.  She wanted us to be aware of pt's situation.  She will call tomorrow to update us.

## 2014-06-21 NOTE — Telephone Encounter (Signed)
States she just had a visit w pt, has gained 8 pounds, and very out of breath. Please call her.

## 2014-06-22 ENCOUNTER — Emergency Department: Payer: Self-pay | Admitting: Emergency Medicine

## 2014-06-22 LAB — BASIC METABOLIC PANEL
ANION GAP: 7 (ref 7–16)
BUN: 35 mg/dL — ABNORMAL HIGH (ref 7–18)
CALCIUM: 7.5 mg/dL — AB (ref 8.5–10.1)
Chloride: 105 mmol/L (ref 98–107)
Co2: 30 mmol/L (ref 21–32)
Creatinine: 1.81 mg/dL — ABNORMAL HIGH (ref 0.60–1.30)
EGFR (Non-African Amer.): 39 — ABNORMAL LOW
GFR CALC AF AMER: 47 — AB
GLUCOSE: 273 mg/dL — AB (ref 65–99)
Osmolality: 301 (ref 275–301)
Potassium: 4.6 mmol/L (ref 3.5–5.1)
Sodium: 142 mmol/L (ref 136–145)

## 2014-06-22 LAB — CBC
HCT: 24.5 % — AB (ref 40.0–52.0)
HGB: 8.1 g/dL — AB (ref 13.0–18.0)
MCH: 29.7 pg (ref 26.0–34.0)
MCHC: 33 g/dL (ref 32.0–36.0)
MCV: 90 fL (ref 80–100)
Platelet: 78 10*3/uL — ABNORMAL LOW (ref 150–440)
RBC: 2.72 10*6/uL — ABNORMAL LOW (ref 4.40–5.90)
RDW: 14.4 % (ref 11.5–14.5)
WBC: 6.7 10*3/uL (ref 3.8–10.6)

## 2014-06-22 LAB — TROPONIN I: Troponin-I: 0.04 ng/mL

## 2014-06-22 LAB — T4, FREE: Free Thyroxine: 1.21 ng/dL (ref 0.76–1.46)

## 2014-06-22 LAB — TSH: Thyroid Stimulating Horm: 5.11 u[IU]/mL — ABNORMAL HIGH

## 2014-06-22 NOTE — Telephone Encounter (Signed)
Received fax from Vickie stating: "Your patient has gain 8.6 lbs in 8 days w/ no s/s being reported on the daylink monitor.  Spoke w/ pt and he reports b/l legs, feet and ankles are swollen.  He also reported in his L hand only.  Pt was audibly SOB during call but was able to carry on conversation.  He has recently been d/c'd from the hospital w/ PNA that was cultured out as MRSA.  His MD who is handling his PNA is changing his currently antibiotic that is more appropriate for issue.  Thank you for your review."

## 2014-06-23 NOTE — Telephone Encounter (Signed)
We need his weight Previously we will following the regimen below  For weight less than 297, no torsemide For weight 297 to 300, take torsemide 20 to 40 mg once a day For weight 301 to 305, take torsemide 40 mg in the Am and 20 to 40 mg in the PM For weight 306 to 310, take torsemide 80 mg twice a day For weight >310, take metolazone 5 mg in the Am, 30 minutes later, take torsemide 80 mg , take torsemide 80 again in the PM

## 2014-06-26 ENCOUNTER — Telehealth: Payer: Self-pay | Admitting: *Deleted

## 2014-06-26 DIAGNOSIS — E1122 Type 2 diabetes mellitus with diabetic chronic kidney disease: Secondary | ICD-10-CM

## 2014-06-26 NOTE — Telephone Encounter (Signed)
Called to report that pt passed out yesterday when he tried to get up & 911 was called. His blood sugar was 60 when paramedics checked it after his wife & daughter was able to give him a little food. Insulin intake was discussed. After a good conversation with family today, he is very motivated to change his eating habits because he admitted that he was not eating very well. Does not want to be seen here but want to know if his Humalog dose should be changed & wants to know if he could have a dietician consult that is covered by him insurance.  Vicky callback# if needed: [phone] ext. [phone].

## 2014-06-26 NOTE — Telephone Encounter (Signed)
Pt states that he is felling better & he is currently taking Lantus: 24am/35pm & Humalog 24/22/14. Pt states that his sugar was 267 this morning before breakfast & he took his insulin dose. Pt was instruct to Change Humalog to 20/20/10 & to records sugar bid - tid & send them in.

## 2014-06-26 NOTE — Telephone Encounter (Signed)
Has his sugar been running low recently or is this the only reading.  I need him to check his sugars at least bid - tid.  Record.  Send in.  Need to clarify how much short acting insulin he is taking.  He takes it before each meal.  Need to know amount.  (we had discussed titrating down the dose).

## 2014-06-26 NOTE — Telephone Encounter (Signed)
Left message for pt to call back  °

## 2014-06-27 NOTE — Telephone Encounter (Signed)
Pt okay with both referrals but would like to put the [facility] referral out until next year. Pt also states that he took 10 units of Humalog last night & at 1:30 this morning his sugars were 38. He got up & ate some ice cream. Pt admitted that he has not been eating regular meals with snacks in between, but he is going to work on that.

## 2014-06-27 NOTE — Telephone Encounter (Signed)
Spoke to pt to clarify when he is taking his Humalog.  He sometimes takes it prior to going to bed instead of with supper.  Explained that he is always to take his short acting humalog with a meal.  Also explained to him that he is to eat a bedtime snack.  Will decrease his Lantus to 32 units q pm and change humalog to 5 units at supper.  Call tomorrow with sugar readings.  Referral placed for referral to endocrinology.

## 2014-06-27 NOTE — Telephone Encounter (Signed)
Notify him that I can refer him to [facility] if agreeable.  Also, I would like to get him scheduled with Dr Welford RochePhadke for further evaluation of his sugars.  If agreeable to both of these, let me know.  I want sugar readings within the next few days.  Also, he needs to make sure he eats regular meals.

## 2014-06-27 NOTE — Telephone Encounter (Signed)
Order placed for referral to endocrinology.  

## 2014-06-28 NOTE — Telephone Encounter (Signed)
Left message for pt to call back  °

## 2014-06-29 NOTE — Telephone Encounter (Signed)
Would agree, follow regimen as detailed on previous AVS Follow up in clinic

## 2014-06-29 NOTE — Telephone Encounter (Signed)
Spoke w/ pt.  He reports that his wt is up to 315, his legs are swollen and he "just keeps getting bigger and bigger".  Pt reports that he has not been following the instructions from last ov, has not been taking torsemide based on wt and has not taken metolazone at all.  Pt requests instructions be sent to him via MyChart, as he has misplaced his instruction sheet.  Reviewed verbally w/ pt and he understands directions for today.   He is sched to see Eula Listenyan Dunn, PA on 07/04/14.  He will call back if sx do not improve before his appt.

## 2014-06-29 NOTE — Telephone Encounter (Signed)
Left message for pt to call back  °

## 2014-06-30 ENCOUNTER — Other Ambulatory Visit: Payer: Self-pay | Admitting: Internal Medicine

## 2014-07-02 DIAGNOSIS — E780 Pure hypercholesterolemia: Secondary | ICD-10-CM

## 2014-07-02 DIAGNOSIS — I5032 Chronic diastolic (congestive) heart failure: Secondary | ICD-10-CM

## 2014-07-02 DIAGNOSIS — R609 Edema, unspecified: Secondary | ICD-10-CM

## 2014-07-02 DIAGNOSIS — R0602 Shortness of breath: Secondary | ICD-10-CM

## 2014-07-02 DIAGNOSIS — I4891 Unspecified atrial fibrillation: Secondary | ICD-10-CM

## 2014-07-02 DIAGNOSIS — I10 Essential (primary) hypertension: Secondary | ICD-10-CM

## 2014-07-02 DIAGNOSIS — N184 Chronic kidney disease, stage 4 (severe): Secondary | ICD-10-CM

## 2014-07-02 DIAGNOSIS — E0821 Diabetes mellitus due to underlying condition with diabetic nephropathy: Secondary | ICD-10-CM

## 2014-07-02 DIAGNOSIS — D696 Thrombocytopenia, unspecified: Secondary | ICD-10-CM

## 2014-07-02 DIAGNOSIS — G4733 Obstructive sleep apnea (adult) (pediatric): Secondary | ICD-10-CM

## 2014-07-04 ENCOUNTER — Encounter: Payer: Medicare Other | Admitting: Physician Assistant

## 2014-07-04 ENCOUNTER — Other Ambulatory Visit: Payer: Self-pay | Admitting: Internal Medicine

## 2014-07-04 ENCOUNTER — Other Ambulatory Visit: Payer: Self-pay | Admitting: Cardiovascular Disease

## 2014-07-04 ENCOUNTER — Ambulatory Visit: Payer: Medicare Other | Admitting: Endocrinology

## 2014-07-04 ENCOUNTER — Encounter: Payer: Self-pay | Admitting: *Deleted

## 2014-07-04 NOTE — Progress Notes (Signed)
Patient was a no show.   This encounter was created in error - please disregard. 

## 2014-07-05 ENCOUNTER — Other Ambulatory Visit: Payer: Self-pay | Admitting: *Deleted

## 2014-07-05 MED ORDER — METOLAZONE 5 MG PO TABS: 5.0000 mg | ORAL_TABLET | Freq: Every day | ORAL | Status: AC | PRN

## 2014-07-10 ENCOUNTER — Encounter: Payer: Self-pay | Admitting: *Deleted

## 2014-07-12 NOTE — Telephone Encounter (Signed)
Unread mychart message mailed to patient 

## 2014-07-17 ENCOUNTER — Other Ambulatory Visit: Payer: Self-pay | Admitting: Internal Medicine

## 2014-07-25 NOTE — Telephone Encounter (Signed)
Left message, notifying pt that Rxs not in his chart, asking who had originally prescribed them and how he is taking them.

## 2014-07-25 NOTE — Telephone Encounter (Signed)
Spoke to patient about hydralazine. States he was Rx'd it at ED visit in December, he was seen for SOB and HBP. Taking 4 times daily. Last BP from yesterday was 153/40s, HR in the 60s. Needing refill. Pt also mentioned he has had 3 low sugar readings over the last month, most recent this morning, in the 30s. Symptomatic, fell out of the bed, EMS came to evaluate, was not taken to ED this time. Verified Lantus dose. Verified Humalog dose, meds updated in chart. States other fasting readings are 150-200. Pt declined to reschedule Dr. Welford RochePhadke appt, states too many medical bills at this time from the hospital. Next appt with Dr. Lorin PicketScott 08/10/14. Would you like to see him sooner?

## 2014-07-26 NOTE — Telephone Encounter (Signed)
I am ok to refill the hydralazine.  Have him stop his humalog dose in the evening.  (sometimes he takes this prior to bed instead of with his supper).  Also, have him decrease his Lantus dose to 30 units in the evening (per chart - on 35 units).  Needs to let us know if has more low readings.  Needs to make sure he is eating a snack prior to bed.  Also, I need him to reschedule the appt with Dr Welford RochePhadke.  Let him know that this is what I recommend and feel is best for him.  Let me know if any problems.

## 2014-07-26 NOTE — Telephone Encounter (Signed)
Pt notified and  verbalized understanding. CBG this morning 168, did not take any insulin yesterday. Verbalized understanding to stop evening Humalog and to decrease evening Lantus. Will call back with any low readings. States he has been having a bedtime snack. Declines appt with Dr. Welford RochePhadke. States he know what he is doing wrong and seeing another doctor isn't going to fix it. States he is working to improve his behavior and his wife is helping him also. FYI

## 2014-07-26 NOTE — Telephone Encounter (Signed)
Pt did take Lantus yesterday evening (35 units).  Did not take humalog.  Sugar this am 160s.  Will continue to hold pm humalog.  Will decrease lantus to 30 units in the pm.  Instructed to call us Friday with sugar readings unless problem or concern before.  Also advised glucagon pen.  Will send in rx.

## 2014-08-03 ENCOUNTER — Other Ambulatory Visit: Payer: Self-pay | Admitting: Internal Medicine

## 2014-08-03 NOTE — Telephone Encounter (Signed)
I did not prescribe this medication.  He will need to get this prescription from MD that prescribed.

## 2014-08-03 NOTE — Telephone Encounter (Signed)
Last OV 11.17.15.  Rx discontinued by you on 11.17.15.  Please advise refill

## 2014-08-05 ENCOUNTER — Ambulatory Visit: Payer: Self-pay | Admitting: Physician Assistant

## 2014-08-10 ENCOUNTER — Inpatient Hospital Stay: Payer: Self-pay | Admitting: Internal Medicine

## 2014-08-10 ENCOUNTER — Ambulatory Visit (INDEPENDENT_AMBULATORY_CARE_PROVIDER_SITE_OTHER): Payer: Medicare Other | Admitting: Internal Medicine

## 2014-08-10 ENCOUNTER — Encounter: Payer: Self-pay | Admitting: Internal Medicine

## 2014-08-10 DIAGNOSIS — I10 Essential (primary) hypertension: Secondary | ICD-10-CM

## 2014-08-10 DIAGNOSIS — E7800 Pure hypercholesterolemia, unspecified: Secondary | ICD-10-CM

## 2014-08-10 DIAGNOSIS — I5032 Chronic diastolic (congestive) heart failure: Secondary | ICD-10-CM

## 2014-08-10 DIAGNOSIS — R600 Localized edema: Secondary | ICD-10-CM

## 2014-08-10 DIAGNOSIS — N184 Chronic kidney disease, stage 4 (severe): Secondary | ICD-10-CM

## 2014-08-10 DIAGNOSIS — R0602 Shortness of breath: Secondary | ICD-10-CM

## 2014-08-10 DIAGNOSIS — R609 Edema, unspecified: Secondary | ICD-10-CM

## 2014-08-10 DIAGNOSIS — E0821 Diabetes mellitus due to underlying condition with diabetic nephropathy: Secondary | ICD-10-CM

## 2014-08-10 LAB — CBC
HCT: 24.2 % — ABNORMAL LOW (ref 40.0–52.0)
HGB: 7.7 g/dL — ABNORMAL LOW (ref 13.0–18.0)
MCH: 29.4 pg (ref 26.0–34.0)
MCHC: 31.9 g/dL — AB (ref 32.0–36.0)
MCV: 92 fL (ref 80–100)
Platelet: 128 10*3/uL — ABNORMAL LOW (ref 150–440)
RBC: 2.63 10*6/uL — ABNORMAL LOW (ref 4.40–5.90)
RDW: 17.9 % — ABNORMAL HIGH (ref 11.5–14.5)
WBC: 11.4 10*3/uL — AB (ref 3.8–10.6)

## 2014-08-10 LAB — BASIC METABOLIC PANEL
Anion Gap: 8 (ref 7–16)
BUN: 23 mg/dL — ABNORMAL HIGH (ref 7–18)
CO2: 27 mmol/L (ref 21–32)
Calcium, Total: 7.9 mg/dL — ABNORMAL LOW (ref 8.5–10.1)
Chloride: 106 mmol/L (ref 98–107)
Creatinine: 2.03 mg/dL — ABNORMAL HIGH (ref 0.60–1.30)
EGFR (African American): 41 — ABNORMAL LOW
GFR CALC NON AF AMER: 34 — AB
GLUCOSE: 151 mg/dL — AB (ref 65–99)
OSMOLALITY: 288 (ref 275–301)
POTASSIUM: 4.5 mmol/L (ref 3.5–5.1)
Sodium: 141 mmol/L (ref 136–145)

## 2014-08-10 LAB — TROPONIN I

## 2014-08-10 LAB — CK TOTAL AND CKMB (NOT AT ARMC)
CK, TOTAL: 384 U/L — AB (ref 39–308)
CK, Total: 359 U/L — ABNORMAL HIGH (ref 39–308)
CK-MB: 4.5 ng/mL — ABNORMAL HIGH (ref 0.5–3.6)
CK-MB: 5.2 ng/mL — ABNORMAL HIGH (ref 0.5–3.6)

## 2014-08-10 NOTE — Progress Notes (Signed)
Pre visit review using our clinic review tool, if applicable. No additional management support is needed unless otherwise documented below in the visit note. 

## 2014-08-11 ENCOUNTER — Other Ambulatory Visit: Payer: Self-pay | Admitting: Cardiovascular Disease

## 2014-08-11 DIAGNOSIS — I5032 Chronic diastolic (congestive) heart failure: Secondary | ICD-10-CM

## 2014-08-11 DIAGNOSIS — D649 Anemia, unspecified: Secondary | ICD-10-CM

## 2014-08-11 DIAGNOSIS — R0600 Dyspnea, unspecified: Secondary | ICD-10-CM

## 2014-08-11 DIAGNOSIS — R609 Edema, unspecified: Secondary | ICD-10-CM

## 2014-08-11 LAB — CBC WITH DIFFERENTIAL/PLATELET
BASOS PCT: 0.2 %
Basophil #: 0 10*3/uL (ref 0.0–0.1)
EOS ABS: 0 10*3/uL (ref 0.0–0.7)
Eosinophil %: 0.5 %
HCT: 22.8 % — ABNORMAL LOW (ref 40.0–52.0)
HGB: 7.5 g/dL — AB (ref 13.0–18.0)
LYMPHS ABS: 0.5 10*3/uL — AB (ref 1.0–3.6)
Lymphocyte %: 6 %
MCH: 29.5 pg (ref 26.0–34.0)
MCHC: 32.9 g/dL (ref 32.0–36.0)
MCV: 90 fL (ref 80–100)
MONO ABS: 2.3 x10 3/mm — AB (ref 0.2–1.0)
Monocyte %: 29.9 %
NEUTROS PCT: 63.4 %
Neutrophil #: 4.8 10*3/uL (ref 1.4–6.5)
Platelet: 100 10*3/uL — ABNORMAL LOW (ref 150–440)
RBC: 2.55 10*6/uL — ABNORMAL LOW (ref 4.40–5.90)
RDW: 17.5 % — ABNORMAL HIGH (ref 11.5–14.5)
WBC: 7.6 10*3/uL (ref 3.8–10.6)

## 2014-08-11 LAB — CK TOTAL AND CKMB (NOT AT ARMC)
CK, TOTAL: 313 U/L — AB (ref 39–308)
CK-MB: 4.3 ng/mL — ABNORMAL HIGH (ref 0.5–3.6)

## 2014-08-11 LAB — BASIC METABOLIC PANEL
Anion Gap: 6 — ABNORMAL LOW (ref 7–16)
BUN: 27 mg/dL — AB (ref 7–18)
CREATININE: 2.2 mg/dL — AB (ref 0.60–1.30)
Calcium, Total: 8.1 mg/dL — ABNORMAL LOW (ref 8.5–10.1)
Chloride: 104 mmol/L (ref 98–107)
Co2: 31 mmol/L (ref 21–32)
EGFR (African American): 38 — ABNORMAL LOW
GFR CALC NON AF AMER: 31 — AB
Glucose: 223 mg/dL — ABNORMAL HIGH (ref 65–99)
Osmolality: 293 (ref 275–301)
POTASSIUM: 3.8 mmol/L (ref 3.5–5.1)
Sodium: 141 mmol/L (ref 136–145)

## 2014-08-11 LAB — TROPONIN I

## 2014-08-12 ENCOUNTER — Encounter: Payer: Self-pay | Admitting: Internal Medicine

## 2014-08-12 LAB — CBC WITH DIFFERENTIAL/PLATELET
BASOS PCT: 0.3 %
Basophil #: 0 10*3/uL (ref 0.0–0.1)
Eosinophil #: 0 10*3/uL (ref 0.0–0.7)
Eosinophil %: 0.6 %
HCT: 22.6 % — AB (ref 40.0–52.0)
HGB: 7.3 g/dL — ABNORMAL LOW (ref 13.0–18.0)
Lymphocyte #: 0.4 10*3/uL — ABNORMAL LOW (ref 1.0–3.6)
Lymphocyte %: 6.1 %
MCH: 29.3 pg (ref 26.0–34.0)
MCHC: 32.1 g/dL (ref 32.0–36.0)
MCV: 91 fL (ref 80–100)
Monocyte #: 2.2 x10 3/mm — ABNORMAL HIGH (ref 0.2–1.0)
Monocyte %: 32.5 %
Neutrophil #: 4.2 10*3/uL (ref 1.4–6.5)
Neutrophil %: 60.5 %
Platelet: 83 10*3/uL — ABNORMAL LOW (ref 150–440)
RBC: 2.48 10*6/uL — AB (ref 4.40–5.90)
RDW: 17 % — AB (ref 11.5–14.5)
WBC: 6.9 10*3/uL (ref 3.8–10.6)

## 2014-08-12 LAB — BASIC METABOLIC PANEL
Anion Gap: 6 — ABNORMAL LOW (ref 7–16)
BUN: 31 mg/dL — AB (ref 7–18)
CHLORIDE: 104 mmol/L (ref 98–107)
Calcium, Total: 8.6 mg/dL (ref 8.5–10.1)
Co2: 32 mmol/L (ref 21–32)
Creatinine: 2.19 mg/dL — ABNORMAL HIGH (ref 0.60–1.30)
EGFR (African American): 38 — ABNORMAL LOW
EGFR (Non-African Amer.): 31 — ABNORMAL LOW
Glucose: 117 mg/dL — ABNORMAL HIGH (ref 65–99)
OSMOLALITY: 291 (ref 275–301)
POTASSIUM: 3.7 mmol/L (ref 3.5–5.1)
Sodium: 142 mmol/L (ref 136–145)

## 2014-08-12 LAB — IRON AND TIBC
IRON SATURATION: 15 %
Iron Bind.Cap.(Total): 312 ug/dL (ref 250–450)
Iron: 47 ug/dL — ABNORMAL LOW (ref 65–175)
Unbound Iron-Bind.Cap.: 265 ug/dL

## 2014-08-12 LAB — RETICULOCYTES
Absolute Retic Count: 0.094 10*6/uL (ref 0.019–0.186)
Reticulocyte: 3.5 % — ABNORMAL HIGH (ref 0.4–3.1)

## 2014-08-12 LAB — FOLATE: Folic Acid: 16.4 ng/mL (ref 3.1–17.5)

## 2014-08-12 LAB — FERRITIN: FERRITIN (ARMC): 70 ng/mL (ref 8–388)

## 2014-08-12 LAB — HEMOGLOBIN: HGB: 9 g/dL — ABNORMAL LOW (ref 13.0–18.0)

## 2014-08-12 LAB — LACTATE DEHYDROGENASE: LDH: 268 U/L — ABNORMAL HIGH (ref 85–241)

## 2014-08-12 NOTE — Progress Notes (Signed)
Subjective:    Patient ID: [name], male    DOB: 02/25/1935, 80 y.o.   MRN: [id number]  HPI 80 year old male with past history of hypertension, hypercholesterolemia, diabetes with renal insufficiency (previously followed by Dr Hyacinth MeekerMiller) and thrombocytopenia who comes in today for a scheduled follow up.  He has been recently hospitalized with worsening sob secondary to congestive heart failure and weakness. He was doing better for a short period of time.  Recently has noticed increased cough and congestion.  [facility].  Diagnosed with pneumonia.  On Levaquin.  States feels worse.  Cough is mostly non productive.  Chest is sore to touch and sore with coughing.  Increased sob.  Increased lower extremity swelling.    Also has known COPD and is followed by Dr Kendrick FriesMcQuaid.  No acid reflux.  No increased heart rate or palpitations.   Sugars varying.  See attached list.  Still issues with some low sugars.  We have adjusted his insulin.  He is not taking short acting insulin now at supper.             Past Medical History  Diagnosis Date  . Arthritis   . Sleep apnea     a. On CPAP; b. Severe OSA by sleep study 01/2014.  Marland Kitchen. COPD (chronic obstructive pulmonary disease)   . Hypercholesterolemia   . Hypertension   . Thrombocytopenia   . CKD (chronic kidney disease), stage III   . Diverticulosis   . History of colon polyps   . Type II diabetes mellitus   . Chronic diastolic CHF (congestive heart failure), NYHA class 3     a. 06/2013 Echo: EF 55-60%, mild conc LVH, mildly dil LA/RA, mild-mod Ao sclerosis w/o stenosis;  b. 02/2014 Echo: EF 55-60%, mild conc LVH, mildly dil LA/RA, mild-mod Ao sclerosis w/o stenosis.  . Chest pain     a. 01/2013 Stress only myoview: low risk->Med Rx.  Marland Kitchen. PAF (paroxysmal atrial fibrillation)     a. On amio.  Previously on Xarelto but self discontinued 2/2 concern r/t swelling.    Current Outpatient Prescriptions on File Prior to Visit   Medication Sig Dispense Refill  . amiodarone (PACERONE) 200 MG tablet TAKE 1 TABLET (200 MG TOTAL) BY MOUTH DAILY. 30 tablet 6  . atorvastatin (LIPITOR) 20 MG tablet Take 1 tablet (20 mg total) by mouth daily. 90 tablet 1  . BAYER CONTOUR NEXT TEST test strip TEST BLOOD SUGAR 3 TIMES A DAY 300 each 3  . DEXILANT 60 MG capsule TAKE 1 CAPSULE BY MOUTH ONCE A DAY 30 capsule 1  . escitalopram (LEXAPRO) 10 MG tablet TAKE 1/2 TABLET BY MOUTH ONCE A DAY 15 tablet 2  . hydrALAZINE (APRESOLINE) 50 MG tablet TAKE 1 TABLET BY MOUTH 4 TIMES A DAY 120 tablet 1  . hyoscyamine (LEVBID) 0.375 MG 12 hr tablet TAKE 1 TABLET (0.375 MG TOTAL) BY MOUTH 2 (TWO) TIMES DAILY. (Patient taking differently: TAKE 1 TABLET (0.375 MG TOTAL) BY MOUTH DAILY.) 60 tablet 2  . insulin glargine (LANTUS) 100 UNIT/ML injection TAKE 26 UNITS IN THE AM AND 30 UNITS IN THE PM.    . insulin lispro (HUMALOG) 100 UNIT/ML injection INJECT 10 UNITS AT BREAKFAST, 10 UNITS AT LUNCH    . LYRICA 75 MG capsule TAKE ONE CAPSULE BY MOUTH TWICE A DAY 60 capsule 1  . metolazone (ZAROXOLYN) 5 MG tablet Take 1 tablet (5 mg total) by mouth daily as needed.  30 tablet 3  . OXYGEN 3 Liters daily    . PROAIR HFA 108 (90 BASE) MCG/ACT inhaler INHALE 2 PUFFS 4 TIMES A DAY AS NEEDED FOR SHORTNESS OF BREATH 8.5 each 0  . SPIRIVA RESPIMAT 2.5 MCG/ACT AERS USE 2 PUUFS ONCE A DAY (Patient taking differently: USE 2 PUFFS TWICE A DAY) 4 g 0  . SYMBICORT 80-4.5 MCG/ACT inhaler INHALE 2 PUFFS INTO THE LUNGS 2 (TWO) TIMES DAILY. 10.2 g 5  . Tamsulosin HCl (FLOMAX) 0.4 MG CAPS Take 0.4 mg by mouth daily.     Marland Kitchen zoster vaccine live, PF, (ZOSTAVAX) 16109 UNT/0.65ML injection Inject 19,400 Units into the skin once. (Patient not taking: Reported on 08/10/2014) 1 each 0   No current facility-administered medications on file prior to visit.    Review of Systems Patient denies lightheadedness or dizziness.  No sinus or allergy symptoms.  No palpitations.  No increased heart  rate.  Does report increased chest pain with coughing and with palpitations.  increased cough.  Mostly non productive.  Increased sob with exertion.  Sob with minimal activity.  Recently diagnosed with pneumonia.  On Levaquin.  Feels worse.   No nausea or vomiting.  Some abdomen swelling and fullness.  No urine change.   Sugars varying.  Still some lows.  See attached list for details.   Have discussed the importance of eating regular meals and not going long periods without eating.  Discussed how to adjust his insulin.  Off oral medications.        Objective:   Physical Exam  Filed Vitals:   08/10/14 1314  BP: 140/60  Pulse: 62  Temp: 97.8 F (26.72 C)   80 year old male in no acute distress.  HEENT:  Nares - clear.  Oropharynx - without lesions. NECK:  No audible carotid bruit.   HEART:  Rate controlled.  Appears to be regular.    LUNGS:  Increased congestion base of lung.  No wheezing.    RADIAL PULSE:  Equal bilaterally.  ABDOMEN:  Nontender.  Bowel sounds present and normal.  No audible abdominal bruit.  Some abdominal fullness.   EXTREMITIES:  Edema - increased lower extremity swelling - left > right.         Assessment & Plan:  1. Severe obesity (BMI >= 40) Diet and exercise.    2. Chronic diastolic heart failure Increased fluid intention and sob as outlined.  Worsening sob.  Given symptoms and worsening clinical status, I do feel he warrants further evaluation and treatment at the hospital.  Pt transferred to ER for evaluation.    3. Essential hypertension, benign Blood pressure has been doing well.  Follow.    4. Diabetes mellitus due to underlying condition with diabetic nephropathy Sugars as outlined and attached.  Only on insulin now.  Adjusting.  Trying to avoid low sugars.  Have discussed diet and exercise.  Discussed importance of eating regular meals and not going long periods without eating.  Follow.    5. CKD (chronic kidney disease) stage 4, GFR 15-29  ml/min Worsening renal function, noted last hospitalization.  Has not followed through with nephrology referral.  Further w/up and evaluation in hospital.    6. Shortness of breath Worsening despite being treated for pneumonia.  Worsening fluid retention.  Transfer to hospital for further treatment.    7. Edema extremities See above.  To hospital for further evaluation and treatment.    8. Hypercholesterolemia Low cholesterol diet and exercise.  On  atorvastatin.  Follow lipid panel and liver function tests.    9. MSK.  S/p knee surgery.   Continues to f/u with Dr Gavin Potters.       HEALTH MAINTENANCE.  Prostate and PSAs through Dr Achilles Dunk. Colonoscopy 10/21/11 - diverticulosis and several polyps. Due follow up colonoscopy in 2016.    I spent more than 25 minutes with this patient and more than 50% of the time was spent in consultation regarding the above, specifically discussing his preadmission symptoms, his treatment and hospital course, his blood sugars, etc.

## 2014-08-13 LAB — BASIC METABOLIC PANEL
Anion Gap: 4 — ABNORMAL LOW (ref 7–16)
BUN: 33 mg/dL — ABNORMAL HIGH (ref 7–18)
CO2: 32 mmol/L (ref 21–32)
CREATININE: 2.03 mg/dL — AB (ref 0.60–1.30)
Calcium, Total: 8.3 mg/dL — ABNORMAL LOW (ref 8.5–10.1)
Chloride: 103 mmol/L (ref 98–107)
GFR CALC AF AMER: 41 — AB
GFR CALC NON AF AMER: 34 — AB
GLUCOSE: 179 mg/dL — AB (ref 65–99)
Osmolality: 289 (ref 275–301)
Potassium: 4.1 mmol/L (ref 3.5–5.1)
SODIUM: 139 mmol/L (ref 136–145)

## 2014-08-13 LAB — CBC WITH DIFFERENTIAL/PLATELET
Basophil #: 0 10*3/uL (ref 0.0–0.1)
Basophil %: 0.3 %
EOS ABS: 0 10*3/uL (ref 0.0–0.7)
Eosinophil %: 0.5 %
HCT: 26 % — AB (ref 40.0–52.0)
HGB: 8.6 g/dL — AB (ref 13.0–18.0)
LYMPHS PCT: 3.2 %
Lymphocyte #: 0.3 10*3/uL — ABNORMAL LOW (ref 1.0–3.6)
MCH: 29.4 pg (ref 26.0–34.0)
MCHC: 33 g/dL (ref 32.0–36.0)
MCV: 89 fL (ref 80–100)
Monocyte #: 3.1 x10 3/mm — ABNORMAL HIGH (ref 0.2–1.0)
Monocyte %: 31.8 %
NEUTROS PCT: 64.2 %
Neutrophil #: 6.2 10*3/uL (ref 1.4–6.5)
Platelet: 96 10*3/uL — ABNORMAL LOW (ref 150–440)
RBC: 2.91 10*6/uL — ABNORMAL LOW (ref 4.40–5.90)
RDW: 16.7 % — ABNORMAL HIGH (ref 11.5–14.5)
WBC: 9.6 10*3/uL (ref 3.8–10.6)

## 2014-08-14 ENCOUNTER — Telehealth: Payer: Self-pay | Admitting: Nurse Practitioner

## 2014-08-14 LAB — BASIC METABOLIC PANEL
Anion Gap: 7 (ref 7–16)
BUN: 36 mg/dL — ABNORMAL HIGH (ref 7–18)
CO2: 31 mmol/L (ref 21–32)
CREATININE: 2.2 mg/dL — AB (ref 0.60–1.30)
Calcium, Total: 8.7 mg/dL (ref 8.5–10.1)
Chloride: 104 mmol/L (ref 98–107)
GFR CALC AF AMER: 38 — AB
GFR CALC NON AF AMER: 31 — AB
Glucose: 107 mg/dL — ABNORMAL HIGH (ref 65–99)
Osmolality: 292 (ref 275–301)
Potassium: 3.9 mmol/L (ref 3.5–5.1)
SODIUM: 142 mmol/L (ref 136–145)

## 2014-08-14 LAB — HEMOGLOBIN: HGB: 8.1 g/dL — AB (ref 13.0–18.0)

## 2014-08-14 NOTE — Telephone Encounter (Signed)
The patient was discharged from [facility] today for CHF . He has been put on [facility]' schedule 1.28.16.

## 2014-08-14 NOTE — Telephone Encounter (Signed)
Will request discharge summary.

## 2014-08-15 ENCOUNTER — Telehealth: Payer: Self-pay | Admitting: *Deleted

## 2014-08-15 NOTE — Telephone Encounter (Signed)
Discharge Date: 08/14/14  Transition Care Management Follow-up Telephone Call  How have you been since you were released from the hospital? Pt states "doing better"    Do you understand why you were in the hospital? YES   Do you understand the discharge instrcutions? YES  Items Reviewed:  Medications reviewed: YES  Allergies reviewed: NO  Dietary changes reviewed: NO  Referrals reviewed: YES, PT and Home health nurse came to visit him today.  [facility]     Functional Questionnaire:   Activities of Daily Living (ADLs):   He states they are independent in the following:  States they require assistance with the following:    Any transportation issues/concerns?: NO   Any patient concerns? NO   Confirmed importance and date/time of follow-up visits scheduled: YES Thursday 08/17/14 @ 9:30 with Naomie Deanarrie Doss    Confirmed with patient if condition begins to worsen call PCP or go to the ER.  Patient was given the Call-a-Nurse line [phone]: YES

## 2014-08-16 ENCOUNTER — Other Ambulatory Visit: Payer: Self-pay | Admitting: Internal Medicine

## 2014-08-16 ENCOUNTER — Telehealth: Payer: Self-pay | Admitting: *Deleted

## 2014-08-16 NOTE — Telephone Encounter (Signed)
Jonetta OsgoodSheila Davis from [facility] called, states there is a level 1 interaction between Lexapro and Amiodarone.  Please advise

## 2014-08-16 NOTE — Telephone Encounter (Signed)
Spoke with Velna HatchetSheila, advised her of MDs message.

## 2014-08-16 NOTE — Telephone Encounter (Signed)
Notify pt that since he is only on lexapro 5mg  (low dose) - I think will be ok to take, but I would like to have him decrease to qod x 2 weeks and then stop.   We will see if he needs to be on this medication.

## 2014-08-17 ENCOUNTER — Other Ambulatory Visit: Payer: Self-pay | Admitting: Internal Medicine

## 2014-08-17 ENCOUNTER — Encounter: Payer: Self-pay | Admitting: Nurse Practitioner

## 2014-08-17 ENCOUNTER — Ambulatory Visit (INDEPENDENT_AMBULATORY_CARE_PROVIDER_SITE_OTHER): Payer: Medicare Other | Admitting: Nurse Practitioner

## 2014-08-17 VITALS — BP 140/60 | HR 65 | Temp 98.0°F | Resp 18 | Ht 73.0 in | Wt 315.1 lb

## 2014-08-17 DIAGNOSIS — Z23 Encounter for immunization: Secondary | ICD-10-CM

## 2014-08-17 DIAGNOSIS — J441 Chronic obstructive pulmonary disease with (acute) exacerbation: Secondary | ICD-10-CM

## 2014-08-17 MED ORDER — PREGABALIN 75 MG PO CAPS: 75.0000 mg | ORAL_CAPSULE | Freq: Two times a day (BID) | ORAL | Status: AC

## 2014-08-17 MED ORDER — PREGABALIN 75 MG PO CAPS: 75.0000 mg | ORAL_CAPSULE | Freq: Two times a day (BID) | ORAL | Status: DC

## 2014-08-17 NOTE — Progress Notes (Signed)
Pre visit review using our clinic review tool, if applicable. No additional management support is needed unless otherwise documented below in the visit note. 

## 2014-08-17 NOTE — Progress Notes (Signed)
   Subjective:    Patient ID: [name], male    DOB: 11-14-1934, 80 y.o.   MRN: [id number]  HPI  [name] is a 80 yo male with a request for Lyrica refill and following up from CHF 08/10/14 admit through d/c 08/14/14.   1)  Daughter took pt to hospital, getting out of breath easily, not sleeping well, feels fatigued. Intermittent cough with small amount of brown phlegm. Transfused 1 unit of blood    Wt Readings from Last 3 Encounters:  08/17/14 315 lb 1.9 oz (142.937 kg)  08/10/14 315 lb 12 oz (143.223 kg)  06/07/14 302 lb 8 oz (137.213 kg)   Wife smoking 2 ppd in the house in a separate room.  26th- nurse and nurse aide came and PT tomorrow   Set up for home health, PT, nurse and nursing aide   Review of Systems  Constitutional: Positive for fatigue. Negative for fever, chills and diaphoresis.  HENT: Positive for congestion.   Eyes: Negative for visual disturbance.  Respiratory: Positive for shortness of breath. Negative for cough, chest tightness and wheezing.   Cardiovascular: Negative for chest pain, palpitations and leg swelling.  Gastrointestinal: Negative for nausea, vomiting and diarrhea.  Musculoskeletal: Negative for myalgias.  Skin: Negative for rash.  Neurological: Negative for dizziness, weakness, numbness and headaches.  Psychiatric/Behavioral: Positive for sleep disturbance.       Nocturia keeping him up       Objective:   Physical Exam  Constitutional: He is oriented to person, place, and time. He appears well-developed and well-nourished. No distress.  BP 140/60 mmHg  Pulse 65  Temp(Src) 98 F (36.7 C) (Oral)  Resp 18  Ht 6\' 1"  (1.854 m)  Wt 315 lb 1.9 oz (142.937 kg)  BMI 41.58 kg/m2  SpO2 94%   HENT:  Head: Normocephalic and atraumatic.  Right Ear: External ear normal.  Left Ear: External ear normal.  Cardiovascular: Normal rate, regular rhythm, normal heart sounds and intact distal pulses.  Exam reveals no gallop and no friction rub.     No murmur heard. Pulmonary/Chest: Effort normal. No respiratory distress. He has no wheezes. He has no rales. He exhibits no tenderness.  Decreased breath sounds  Abdominal:  Protuberant abdomen   Musculoskeletal: He exhibits edema.  2+ pitting edema.   Neurological: He is alert and oriented to person, place, and time.  Skin: Skin is warm and dry. No rash noted. He is not diaphoretic.  Psychiatric: He has a normal mood and affect. His behavior is normal. Judgment and thought content normal.      Assessment & Plan:

## 2014-08-17 NOTE — Patient Instructions (Addendum)
You are doing a great job!   Please continue following up with Cardiology and Gastroenterology in the coming weeks.    Follow up with us as needed.

## 2014-08-17 NOTE — Telephone Encounter (Signed)
I will handle this at the visit.

## 2014-08-17 NOTE — Telephone Encounter (Signed)
Last refill 12.9.15.  Pt has appoint with you today.  Please advise refill

## 2014-08-18 NOTE — Assessment & Plan Note (Signed)
D/c from the hospital on 08/14/14. Pt is recovering well. He has already by set up with home health, PT, nurse, and a nurse aide. He is also following up with Cardiology and GI within the next few weeks.

## 2014-08-21 ENCOUNTER — Ambulatory Visit: Payer: Self-pay | Admitting: Internal Medicine

## 2014-08-26 ENCOUNTER — Other Ambulatory Visit: Payer: Self-pay | Admitting: Internal Medicine

## 2014-08-27 ENCOUNTER — Other Ambulatory Visit: Payer: Self-pay | Admitting: Internal Medicine

## 2014-08-29 ENCOUNTER — Other Ambulatory Visit: Payer: Self-pay | Admitting: Internal Medicine

## 2014-09-05 ENCOUNTER — Other Ambulatory Visit: Payer: Self-pay | Admitting: Internal Medicine

## 2014-09-06 ENCOUNTER — Other Ambulatory Visit: Payer: Self-pay | Admitting: *Deleted

## 2014-09-06 MED ORDER — TORSEMIDE 20 MG PO TABS: 40.0000 mg | ORAL_TABLET | Freq: Two times a day (BID) | ORAL | Status: AC

## 2014-09-07 ENCOUNTER — Inpatient Hospital Stay: Payer: Self-pay | Admitting: Internal Medicine

## 2014-09-07 ENCOUNTER — Ambulatory Visit: Payer: Medicare Other | Admitting: Cardiovascular Disease

## 2014-09-07 DIAGNOSIS — I5033 Acute on chronic diastolic (congestive) heart failure: Secondary | ICD-10-CM

## 2014-09-07 DIAGNOSIS — I481 Persistent atrial fibrillation: Secondary | ICD-10-CM

## 2014-09-07 DIAGNOSIS — D649 Anemia, unspecified: Secondary | ICD-10-CM

## 2014-09-09 DIAGNOSIS — R601 Generalized edema: Secondary | ICD-10-CM

## 2014-09-18 ENCOUNTER — Telehealth: Payer: Self-pay | Admitting: *Deleted

## 2014-09-18 NOTE — Telephone Encounter (Signed)
Patient was admitted to Hospice on 09/14/14 & died on Jan 25, 2015. Updated chart & mailed sympathy card to patient home address.

## 2014-09-19 ENCOUNTER — Ambulatory Visit
Admit: 2014-09-19 | Disposition: A | Discharge status: Home / Self Care | Payer: Self-pay | Attending: Internal Medicine | Admitting: Internal Medicine

## 2014-09-19 DEATH — deceased

## 2014-09-23 ENCOUNTER — Other Ambulatory Visit: Payer: Self-pay | Admitting: Internal Medicine

## 2014-11-07 NOTE — Op Note (Signed)
PATIENT NAME:  Robert SmokerOLIVE, [name] MR#:  [id number] DATE OF BIRTH:  [birth year]  DATE OF PROCEDURE:  02/16/2012  PREOPERATIVE DIAGNOSIS: Visually significant cataract of the left eye.   POSTOPERATIVE DIAGNOSIS: Visually significant cataract of the left eye.   OPERATIVE PROCEDURE: Cataract extraction by phacoemulsification with implant of intraocular lens to left eye.   SURGEON: Galen ManilaWilliam Adira Limburg, MD.   ANESTHESIA:  1. Managed anesthesia care.  2. Topical tetracaine drops followed by 2% Xylocaine jelly applied in the preoperative holding area.   COMPLICATIONS: None.   TECHNIQUE:  Stop and chop   DESCRIPTION OF PROCEDURE: The patient was examined and consented in the preoperative holding area where the aforementioned topical anesthesia was applied to the left eye and then brought back to the Operating Room where the left eye was prepped and draped in the usual sterile ophthalmic fashion and a lid speculum was placed. A paracentesis was created with the side port blade and the anterior chamber was filled with viscoelastic. A near clear corneal incision was performed with the steel keratome. A continuous curvilinear capsulorrhexis was performed with a cystotome followed by the capsulorrhexis forceps. Hydrodissection and hydrodelineation were carried out with BSS on a blunt cannula. The lens was removed in a stop and chop technique and the remaining cortical material was removed with the irrigation-aspiration handpiece. The capsular bag was inflated with viscoelastic and the Technis ZCBOO 21.5-diopter lens, serial number [id number], was placed in the capsular bag without complication. The remaining viscoelastic was removed from the eye with the irrigation-aspiration handpiece. The wounds were hydrated. The anterior chamber was flushed with Miostat and the eye was inflated to physiologic pressure. The wounds were found to be water tight. The eye was dressed with Vigamox. The patient was given protective glasses  to wear throughout the day and a shield with which to sleep tonight. The patient was also given drops with which to begin a drop regimen today and will follow-up with me in one day.   At the onset of the case, a Clementeen HoofBeaver Blade was used to scrape the central portion of the corneal epithelium as it was microcystic and prevented adequate view for safe intraocular surgery.  ____________________________ Jerilee FieldWilliam L. Aariv Medlock, MD wlp:cbb D: 02/16/2012 20:12:13 ET T: 02/17/2012 09:27:03 ET JOB#: [id number]  cc: Teondre L. Chari Parmenter, MD, <Dictator> Jerilee FieldWILLIAM L Verlinda Slotnick MD ELECTRONICALLY SIGNED 02/24/2012 12:49

## 2014-11-10 NOTE — Consult Note (Signed)
 General Aspect 80 year old male with past history of morbid obesity, hypertension, hypercholesterolemia, diabetes with renal insufficiency, thrombocytopenia, episode of atrial fibrillation in early 2014.  history of COPD, obstructive sleep apnea, on CPAP.  smoked for 25 years, stopped 30 years ago.  elective cardioversion on April 9 214 which was successful.   He has chronic diastolic heart failure and was switched from Lasix to Torsemide. The dose decreased few months ago to 40 mg in the morning and 20 mg in the p.m due to worsening renal function.  He was on Xarelto but stopped it due to concerns about leg edema ? He presented with worsening dyspnea and was found to be in CHF. BNP was 4500.  He improved with IV lasix.  He reports severe constipation . No BM in 7 days.    He's had several pound weight loss 304 pounds down to 299 pounds. He reports weight at home is 292 pounds.   Breathing has been better for him. His biggest complaints are neuropathy in his legs. Lyrica has been helping his but he is concerned that the medication could be causing swelling. He continues to have some pitting edema both legs. He has significant knee and hip pain and knee surgery In general he leads a sedentary lifestyle    History of  severe neck pain. He reports having an episode of neck pain previously that seemed to resolve after an MRI. Then he developed hip pain and was seeking care from an orthopedic specialist.   He reports being compliant with his CPAP   Echocardiogram  shows normal ejection fraction with moderately dilated left atrium, high normal right ventricular systolic pressure   Physical Exam:  GEN no acute distress   NECK supple   RESP normal resp effort  clear BS   CARD Regular rate and rhythm  No murmur   ABD denies tenderness   PSYCH alert, A+O to time, place, person   Review of Systems:  Subjective/Chief Complaint dtspnea, constipation   General: Weight loss or gain    Skin: No Complaints   ENT: No Complaints   Eyes: No Complaints   Neck: No Complaints   Respiratory: Short of breath   Cardiovascular: Dyspnea  Orthopnea  Edema   Gastrointestinal: No Complaints   Vascular: No Complaints   Neurologic: No Complaints   Hematologic: No Complaints   Endocrine: No Complaints   Review of Systems: All other systems were reviewed and found to be negative   Home Medications: Medication Instructions Status  oxyCODONE 5 mg oral tablet 1 tab(s) orally every 4 to 6 hours, As Needed Active  acetaminophen 650 mg oral tablet, extended release 1 tab(s) orally every 8 hours, As Needed Active  amiodarone 200 mg oral tablet 1 tab(s) orally once a day Active  atenolol 50 mg oral tablet 1 tab(s) orally 2 times a day Active  atorvastatin 20 mg oral tablet 1 tab(s) orally once a day (at bedtime) Active  budesonide-formoterol 80 mcg-4.5 mcg/inh inhalation aerosol 2 puff(s) inhaled 2 times a day Active  cloNIDine 0.1 mg oral tablet 1 tab(s) orally 2 times a day Active  docusate sodium 100 mg oral tablet 1 tab(s) orally 2 times a day Active  escitalopram 5 mg oral tablet 1 tab(s) orally once a day Active  glyBURIDE 5 mg oral tablet 1 tab(s) orally once a day Active  hyoscyamine 0.375 mg oral tablet, extended release 1 tab(s) orally every 12 hours Active  Lantus 100 units/mL subcutaneous solution 26   unit(s) subcutaneous in the morning and 35 units in the evening  Active  HumaLOG Pen 24 unit(s) subcutaneous in the morning, 22 units in the afternoon, and 14 units at supper Active  Xopenex HFA CFC free 45 mcg/inh inhalation aerosol 2 puff(s) inhaled every 4 hours, As Needed Active  multivitamin 1 tab(s) orally once a day Active  omeprazole 20 mg oral delayed release tablet 1 tab(s) orally once a day Active  potassium chloride 20 mEq oral tablet, extended release 1 tab(s) orally once a day Active  Zanaflex 4 mg oral capsule 1 cap(s) orally once a day, As Needed Active   Flomax 0.4 mg oral capsule 1 cap(s) orally once a day Active  Januvia 100 mg oral tablet 1 tab(s) orally once a day Active  Lyrica 75 mg oral capsule 1 cap(s) orally 2 times a day Active  torsemide 20 mg oral tablet 2 tab(s) orally in the morning and 1 tab in the evening  Active   Lab Results: Routine Chem:  07-Dec-14 04:09   Glucose, Serum  175  BUN  46  Creatinine (comp)  1.80  Sodium, Serum 141  Potassium, Serum 3.7  Chloride, Serum 104  CO2, Serum  33  Calcium (Total), Serum 9.2  Anion Gap  4  Osmolality (calc) 297  eGFR (African American)  41  eGFR (Non-African American)  36 (eGFR values <72m/min/1.73 m2 may be an indication of chronic kidney disease (CKD). Calculated eGFR is useful in patients with stable renal function. The eGFR calculation will not be reliable in acutely ill patients when serum creatinine is changing rapidly. It is not useful in  patients on dialysis. The eGFR calculation may not be applicable to patients at the low and high extremes of body sizes, pregnant women, and vegetarians.)   Radiology Results: XRay:    05-Dec-14 09:15, Chest 1 View AP or PA  Chest 1 View AP or PA   REASON FOR EXAM:    weakness  COMMENTS:       PROCEDURE: DXR - DXR CHEST 1 VIEWAP OR PA  - Jun 24 2013  9:15AM     CLINICAL DATA:  Complaints of weakness and shortness of breath    EXAM:  CHEST - 1 VIEW    COMPARISON:  06/24/2012    FINDINGS:  The cardiac silhouette is enlarged. Mild areas of increased density  projects in the lung bases. No focal regions of consolidation or  focal infiltrates. Visualized osseous structures unremarkable.     IMPRESSION:  Mild areas increased density within the lung bases likely  representing areas of atelectasis. Alternatively these findings may  represents pole of regions of scarring. Compared to previous studies  findings stable. Low lung volumes.      Electronically Signed    By: HMargaree MackintoshM.D.    On:06/24/2013 09:17          Verified By: HMikki Santee M.D., MD    Oxycodone: Itching  Vital Signs/Nurse's Notes: **Vital Signs.:   07-Dec-14 08:28  Vital Signs Type Pre Medication  Pulse Pulse 69  Systolic BP Systolic BP 1924 Diastolic BP (mmHg) Diastolic BP (mmHg) 64  Mean BP 94    Impression 1. Acute on chronic diastolic heart failure: continue IV lasix and monitor renal function. Needs BP control.  Echo pending. Low sodium diet and daily weights.   2. Persistent A-fib: maintained in NSR on Amiodarone. Check TSH given reported constipation and being on Amidoarone.  He is now agreeable to  resume anticoaguation with Xarelto 15 mg once daily.   3. sleep apnea: continue CPAP.   4. Morbid obesity.   Electronic Signatures: Kathlyn Sacramento (MD)  (Signed 07-Dec-14 10:36)  Authored: General Aspect/Present Illness, History and Physical Exam, Review of System, Home Medications, Labs, Radiology, Allergies, Vital Signs/Nurse's Notes, Impression/Plan   Last Updated: 07-Dec-14 10:36 by Kathlyn Sacramento (MD)

## 2014-11-10 NOTE — H&P (Signed)
PATIENT NAME:  [name], [name] MR#:  [id number] DATE OF BIRTH:  [birth year]  DATE OF ADMISSION:  06/24/2013  PRIMARY CARE PHYSICIAN:  Dr. Dale Durhamharlene Scott.   REFERRING PHYSICIAN:  Dr. Brandt LoosenJulie Manly.   CHIEF COMPLAINT:  Weakness and unable to walk with recurrent falls.   HISTORY OF PRESENT ILLNESS:  The patient is a 80 year old male with a known history of COPD, atrial fibrillation, diabetes, with a recent right partial [facility] about two days ago and was discharged home with Home Health services, and started falling. The patient was trying to use urinal and slid out of the recliner. Wife was unable to help. He called EMS once. He was placed back in the chair but then he fell two more times and slid out of a recliner and was brought into the Emergency Department as he was feeling very weak. He has not had a bowel movement since the last four days and he was using accessory muscles of respiration for breathing. He seemed quite short of breath, also. He was found to be tachycardic with a respiration rate of 28 per minute and was hypoxic to 92% on room air and he is being admitted for further evaluation and management.   PAST MEDICAL HISTORY:  1.  COPD.  2.  Diabetes.  3.  Hypertension.  4.  Atrial fibrillation.  5.  Hyperlipidemia.   PAST SURGICAL HISTORY:  1.  Sinus surgery. 2.  Tonsillectomy.  3.  Right knee partial replacement.   ALLERGIES:  POSSIBLE REACTION of TETANUS VACCINE and OXYCODONE causes a rash, and he is already using that as he is in a lot of pain.   FAMILY HISTORY:  Positive for father who died of heart failure. Mother had dementia. Sister died of MI two years ago. His grandfather also had MI.    SOCIAL HISTORY:  No tobacco or alcohol use. He used to work at [employer]. He is married, lives with his wife.   MEDICATIONS AT HOME:  1.  Tylenol 650 mg p.o. daily as needed.  2.  Amiodarone 200 mg p.o. daily.  3.  Atenolol 50 mg p.o.  b.i.d.  4.  Atorvastatin 20 mg p.o. at bedtime.  5.  Budesonide/formoterol 2 puffs inhaled twice a day.  6.  Clonidine 0.1 mg p.o. b.i.d.  7.  Colace 100 mg p.o. b.i.d.  8.  Lexapro 5 mg p.o. daily.  9.  Flomax 0.4 mg p.o. daily. 10.  Glimepiride 5 mg p.o. daily. 11.  Humalog pen 24 units subcutaneous every morning and 22 units in the afternoon with 14 units at supper. 12.  Hyoscyamine 0.375 mg p.o. b.i.d.  13.  Januvia 100 mg p.o. daily.  14.  Insulin Lantus 26 units subcutaneous every morning,  35 units subcutaneous every evening.  15.  Lyrica 75 mg p.o. b.i.d. 16.  Multivitamin once daily.  17.  Omeprazole 20 mg p.o. daily.  18.  Oxycodone 5 mg p.o. every 4 to 6 hours as needed.  19.  Potassium chloride 20 mEq p.o.  20.  Torsemide 20 mg 2 tablets p.o. every morning and 1 tablet in the evening.  21.  Xopenex 2 puffs inhaled every 4 hours as needed. 22.  Zanaflex 4 mg p.o. daily as needed.   REVIEW OF SYSTEMS:  CONSTITUTIONAL:  No fever. Positive fatigue and weakness.  EYES:  No blurred or double vision.  ENT:  No tinnitus or ear pain.  RESPIRATORY:  Positive for shortness of breath.  No cough, has mild wheezing. Also dyspnea on exertion. Cannot walk more than a block, almost after a half a block he starts getting short of breath.  GASTROINTESTINAL:  No nausea, vomiting, or diarrhea. Positive for constipation, has not had a bowel movement for last four days.  GENITOURINARY:  No dysuria, hematuria. ENDOCRINE:  No polyuria, nocturia. HEMATOLOGY:  No anemia or easy bruising.  SKIN:  No rash or lesion.  MUSCULOSKELETAL:  Arthritis, status post right knee partial replacement.  NEUROLOGIC:  No tingling or numbness, positive for weakness.  PSYCHIATRIC:  No history of anxiety or depression.   PHYSICAL EXAMINATION:  VITAL SIGNS: Temperature 98.3, heart rate 73 per minute, respirations 24 per minute, blood pressure 173/68. On recheck it was 200/86 mmHg. He was saturating 85% on room air and  was placed 2 L oxygen via nasal cannula. It came up to 94% on 2 L oxygen via nasal cannula.  GENERAL:  The patient is a 81 year old male, lying in the bed, in acute respiratory distress.  EYES:  Pupils are equal, round, reactive to light and accommodation. No scleral icterus. Extraocular muscles intact.  HEENT:  Head atraumatic, normocephalic. Oropharynx and nasopharynx clear.  NECK:  Supple. No jugular venous distention. No thyroid enlargement or tenderness.  LUNGS:  Decreased breath sounds at the bases. Using accessory muscles for respirations. Mild bilateral wheezing, no rales or rhonchi.  CARDIOVASCULAR:  S1, S2 normal. No murmurs, rubs or gallops.  ABDOMEN:  Soft, obese, nontender, and nondistended, hypoactive bowel sounds.  EXTREMITIES:  1 to 2+ pedal edema. No cyanosis, clubbing.  NEUROLOGIC:  Nonfocal examination. Cranial nerves II through XII intact. Muscle strength 5 to 4/5 in extremities, global weakness. Sensation intact.  PSYCHIATRIC:  The patient is alert and oriented x 3.  SKIN:  No obvious rash, lesion or ulcer. He does have a surgical scar present on his right knee.  LABORATORY, DIAGNOSTIC, AND RADIOLOGICAL DATA:  Normal BMP except BUN of 37, creatinine 2.03, blood sugar 256 and now it has been running in the 400s. Normal liver function tests. Normal first set of troponin. CBC showed white count of 12.0, hemoglobin 10.5, hematocrit 30.3, platelet 96. PT 19.4, INR 1.7. PTT 37.6. Negative urinalysis.  Chest x-ray in the ED showed atelectasis, possible scarring at the bases. Low lung volumes.   V/Q scan showed a low probability for PE.   Right lower extremity Doppler showed no evidence of DVT.   EKG Showed normal sinus rhythm.   No ST-Rollins changes.   ASSESSMENT AND PLAN: 1.  Acute hypoxic respiratory failure, likely due to chronic obstructive pulmonary disease exacerbation. He was tachypneic, using accessory muscles for respirations. His respiratory rate was 28 per minute & is  hypoxic anywhere from 85% to 92% on room air. We will start him on IV steroids, DuoNebs and Spiriva. Monitor him on off unit telemetry. 2.  Acute on chronic kidney disease, stage III to IV. His baseline creatinine in 2013, the last time was 2.1. So it is not quite changed before that it use to be normal. Avoid nephrotoxin and hydrate him with intravenous fluids and monitor renal function.  3.  Partial right knee surgery at [facility]. Will need placement. We will consult PT, OT and social worker for placement need.  4.  Uncontrolled diabetes. We will check hemoglobin A1c.  We will advance his Lantus, place him on sliding scale insulin and also place him back on NovoLog.  5.  Uncontrolled hypertension. His systolic pressure was  in 190s to 200, likely due to uncontrolled pain. We will add hydralazine and resume his home blood pressure medication. Monitor him on off unit telemetry.   CODE STATUS:  FULL CODE.   TOTAL TIME TAKING CARE OF THIS PATIENT:  55 minutes.  ____________________________ Ellamae Sia. Sherryll Burger, MD vss:jm D: 06/24/2013 16:26:59 ET Rollins: 06/24/2013 17:19:51 ET JOB#: [id number]  cc: Abdulraheem Pineo S. Sherryll Burger, MD, <Dictator> Dale Goldonna, MD Alda Berthold., MD Ellamae Sia [facility] MD ELECTRONICALLY SIGNED 06/24/2013 18:26

## 2014-11-10 NOTE — Discharge Summary (Signed)
PATIENT NAME:  [name], [name] DATE OF BIRTH:  19-Mar-1935  DATE OF ADMISSION:  06/24/2013 DATE OF DISCHARGE: 06/28/2013    For a detailed note, please look at the history and physical done on admission by Dr. Sherryll Rollins.   DIAGNOSES AT DISCHARGE: Are as follows: Acute respiratory failure secondary to congestive heart failure.  Acute on chronic diastolic congestive heart. Diabetes. Obstructive sleep apnea. Chronic atrial fibrillation. Status post recent right knee replacement. Chronic obstructive pulmonary disease. Depression. Constipation. Gastroesophageal reflux disease.   The patient is being discharged on an [employer] low-sodium, low-fat diet.   ACTIVITY: As tolerated.   Follow up is with Dr. Dale Durhamharlene Rollins in the next 1 to 2 weeks.   DISCHARGE MEDICATIONS: Are as follows: Tylenol 650 q.8 hours as needed, amiodarone 200 mg daily, atenolol 50 mg b.i.d., atorvastatin 20 mg daily, Symbicort 80/4.5, 2 puffs b.i.d.; clonidine 0.1 mg b.i.d., Colace 100 mg b.i.d., Lexapro 5 mg daily, glyburide 5 mg daily, hyoscyamine 0.375 mg 1 tab b.i.d., Lantus 26 units in the morning, 35 units in the evening; Humalog pen 24 units in the morning, 22 afternoon in the afternoon and 14 units with supper; Xopenex 2 puffs q.4 hours as needed, multivitamin daily, omeprazole 20 mg daily, potassium 20 mEq daily, Zanaflex 4 mg daily as needed, Flomax 0.4 mg daily, Januvia 100 mg daily, Lyrica 75 mg b.i.d., torsemide 40 mg b.i.d., oxycodone 5 mg q.4-6 hours as needed, Xarelto 15 mg daily, hydralazine 50 mg t.i.d.   Consultants during the hospital course are Dr. Deeann SaintHoward Rollins from orthopedics, Dr. Lorine BearsMuhammad Rollins from cardiology.   PERTINENT STUDIES DONE DURING THE HOSPITAL COURSE:  Are as follows: A chest x-ray done on admission showing mild areas of increased density in the lung bases, likely representing atelectasis. A nuclear medicine ventilation perfusion scan showing low probability for  pulmonary embolism. Dopplers of the right lower extremity showing no evidence of any acute DVT. A 2-dimensional echocardiogram done in December 7th showing ejection fraction to be 55% to 60%, moderate LVH, mildly dilated left atrium, mildly dilated right atrium, moderate to mild aortic valve sclerosis/stenosis.   HOSPITAL COURSE: This is a 80 year old male with medical problems as mentioned above, presented to the hospital on 06/24/2013, due to shortness of breath and acute on chronic renal failure.  1.  Acute respiratory failure. The most likely cause of the patient's acute respiratory failure was decompensated CHF. The patient presented with worsening lower extremity edema and significant weight gain. The patient was started on aggressive IV diuresis with Lasix and responded well to it. He was about 11 liters negative since admission. He has been switched over from his IV Lasix to oral torsemide and is tolerating it well. He clinically feels much better. His O2 sats are stable on 2 liters, which is what he was on, and he is currently being discharged on his maintenance meds for heart failure including beta blockers and Lasix. He is not on an ACE inhibitor given his chronic renal insufficiency.  2.  Acute congestive heart failure. This was acute or chronic diastolic dysfunction. This is likely secondary to his obstructive sleep apnea. The patient's echo showed EF of 55% to 60%. The patient was diuresed aggressively with IV Lasix, has responded well to it and is about 11 liters negative since admission and clinically feels better. He has been switched over from IV Lasix to oral torsemide. The patient clinically feels much better. He is being discharged on oral torsemide and  atenolol. The patient is not on an ACE inhibitor given his chronic renal insufficiency. The patient likely needs to have a metabolic profile checked within the next few days.  3.  Acute or chronic kidney disease. The patient had likely stage  III to IV chronic kidney disease. His baseline creatinine is around 2.1.  It went as high as 2.4. His creatinine presently, on day of discharge, is 2.1. He likely will be referred to nephrology as an outpatient for followup of his kidney disease.  4.  Status post partial right knee replacement at [facility]. The patient presented to the hospital from home after having his knee surgery. He was seen by physical therapy, as he was having frequent falls and had significant weakness at home. After physical therapy recommendation, they thought he would benefit from short-term rehab, which he is currently being discharged to.  5.  Diabetes. The patient has brittle diabetes. Hemoglobin A1c is 7.2 though. He was maintained on some Levemir and sliding scale insulin while in the hospital, although he will resume his Januvia and Lantus and NovoLog with meals as stated.  6.  Uncontrolled hypertension. The patient's antihypertensives were adjusted while in the hospital. The patient currently is being discharged on atenolol, clonidine and hydralazine. His blood pressure meds further may need to be adjusted, as his blood pressure is still quite labile, but not as uncontrolled as it was when he first presented to the hospital.  7.  Chronic atrial fibrillation. The patient currently is in normal sinus rhythm. We will continue his amiodarone and atenolol for rate control. He was offered Xarelto prior to coming in, although after further discussion with cardiology, this has been reinitiated.  8.  Constipation. This was a result of the patient being on chronic pain meds and being not as mobile given his recent knee surgery. It has since resolved with some lactulose and Fleet enema, and also some mag citrate. He will continue Colace b.i.d. for his constipation.   The patient is a FULL CODE.   He is being discharged to a skilled nursing facility for ongoing rehab.   TIME SPENT ON DISCHARGE:  50 minutes.    ____________________________ [name]. Robert Kaiser, MD vjs:dmm D: 06/28/2013 11:46:15 ET T: 06/28/2013 12:35:48 ET JOB#: [id number]  cc: [name]. Robert Kaiser, MD, <Dictator> Robert [place], MD Houston Siren MD ELECTRONICALLY SIGNED 06/30/2013 14:32

## 2014-11-10 NOTE — Consult Note (Signed)
Brief Consult Note: Diagnosis: Weakness following right knee Makoplasty.   Patient was seen by consultant.   Orders entered.   Comments: 80 year old male had a right knee Makoplasty by Dr Erin SonsHarold Kernodle on Wednesday and went home Thursday 06/23/13.  He slid to floor 2-3 times and was unable to walk so wife called EMTs and he was admitted through the Emergency Room yesterday.  chronic obstructive pulmonary disease exacerbated and he was having breathing difficulties as well. white blood count normal.    Exam:  No fever.  Alert but some confusion.  Right knee incision benign. Some cellulitis distal tibia. circulation/sensation/motor function good otherwise. Tried to get out of bed by himself just now against instructions.    X-rays: pending  Rx: Physical Therapy for partial weight bearing right leg       keflex and septra ds for cellulitis.       Ice and ace for knee.  Electronic Signatures: Valinda HoarMiller, Keeven Matty E (MD)  (Signed 06-Dec-14 13:37)  Authored: Brief Consult Note   Last Updated: 06-Dec-14 13:37 by Valinda HoarMiller, Stephenie Navejas E (MD)

## 2014-11-11 NOTE — Consult Note (Signed)
General Aspect Primary Cardiologist: Dr. Mariah Milling, MD __________________  80 year old male with history of chronic diastolic CHF, IDDM with renal insufficiency, HLD, HTN, morbid obesity, thrombocytopenia, episode of a-fib in early 2014 s/p successful elective cardioversion 10/27/2012, COPD, & OSA on CPAP who presented to [facility] ED this morning after calling both our office and his PCP office earlier this week with right sided flank pain. He is on a sliding scale toresemide dosing regimen based on his weight (see HPI). Has been taking too much based on weight. Earlier this morning he was up getting ready to sit to read a magazine and felt weak. He fell and could not get up. It took about an hour for his wife to realize he had fallen. They called EMS for transport. Upon EMS arrival he was noted to have bilateral crackles. Upon his arrival to [facility] he was found to have a SCr of 3.36, pBNP 731, TnI 0.04, WBC 9.9, hgb 10.1. CXR showed Mild bibasilar atelectatic change.  No edema or consolidation. CT abdomen showed No acute abnormality identified in the abdomen or pelvis. Colonic diverticulosis without evidence of diverticulitis. Stable to minimally increased size of left adrenal nodule, most consistent with an adenoma based on enhancement characteristics on the prior CT. Bibasilar subsegmental atelectasis. Developing infection not excluded in the right middle lobe. He continues to complain of right flank pain with movement only. He is constipated. He has not voided since his arrival. Cardiology was consulted for further evaluation.  _________________  PMH: 1. Chronic diastolic CHF 2. IDDM with renal insufficiency 3. HLD 4. HTN 5. Morbid obesity 6. Thrombocytopenia 7. Episode of a-fib in 2014 s/p elective cardioversion 10/27/2012 8. COPD 9. OSA on CPAP ___________________   Present Illness 80 year old male with the above problem list who presented with the above complaint.   Patient with known chronic  diastolic CHF, last echo on 06/26/2913 showed an EF of 55-60%, with moderately dilated left atrium, high normal right ventricular systolic pressure. He is currently on a sliding scale of torsemide wihich is weight based. For weight <300, take torsemide 40 mg once a day, For weight 300 to 305, take torsemide 40 mg twice a day, For weight 306 to 310, take torsemide 80 mg twice a day, For weight >310, take metolazone 5 mg in the Am, 30 minutes later, take torsemide 80 mg , take torsemide 80 again in the PM. He reports today that his weight is 292 (not yet weighed). Last office visit his weight was 296. He reports at home he has been taking 2 tabs in the AM and 1 tab in the PM. History of episode of a-fib early 2014. He underwent cardioversion 10/27/2012.  He called our office earlier this week with right flank pain and was advised to see his PCP. He called his PCP and was advised to go to the ER. He did not go to the ED immediately. He waited until this AM when he had fallen and could not get up. Flank pain is worse with movement. No chest pain. Some SOB. When he does take the torsemide he voids quite a bit, however he does like to drink a lot of ice water, tea and coffee throughout the day.   See general aspect for study details.   Physical Exam:  GEN well developed, well nourished, no acute distress, obese   HEENT hearing intact to voice, moist oral mucosa   NECK supple   RESP normal resp effort  crackles  CARD Regular rate and rhythm  Normal, S1, S2  No murmur   ABD denies tenderness  no hernia  distended   EXTR positive edema, 1+ pitting edema below the knees   SKIN normal to palpation   NEURO motor/sensory function intact   PSYCH alert, A+O to time, place, person, good insight   Review of Systems:  Subjective/Chief Complaint weak, SOB   General: Fatigue  Weakness   Skin: No Complaints   ENT: No Complaints   Eyes: No Complaints   Neck: No Complaints   Respiratory: Short of  breath   Cardiovascular: Dyspnea   Gastrointestinal: No Complaints   Genitourinary: No Complaints   Vascular: No Complaints   Musculoskeletal: No Complaints   Neurologic: No Complaints   Hematologic: No Complaints   Endocrine: No Complaints   Psychiatric: No Complaints   Review of Systems: All other systems were reviewed and found to be negative   Medications/Allergies Reviewed Medications/Allergies reviewed   Family & Social History:  Family and Social History:  Family History Coronary Artery Disease  Mother: CAD, HTN, arthritis, DM; father: cancer   Social History negative ETOH, negative Illicit drugs   + Tobacco Prior (greater than 1 year)  quit 1986 (25 pack years)   Place of Living Home     COPD:    Atrial Fibrillation:    htn:    Diabetes:    COPD:    Knee Surgery - Right:    cataract surgery:    back surgery L3 L4:   Home Medications: Medication Instructions Status  amLODIPine 5 mg oral tablet 1 tab(s) orally once a day Active  torsemide 20 mg oral tablet 2 tab(s) orally 2 times a day Active  carvedilol 6.25 mg oral tablet 1 tab(s) orally 2 times a day Active  amiodarone 200 mg oral tablet 1 tab(s) orally once a day Active  atorvastatin 20 mg oral tablet 1 tab(s) orally once a day (at bedtime) Active  cloNIDine 0.1 mg oral tablet 1 tab(s) orally 2 times a day Active  escitalopram 5 mg oral tablet 1 tab(s) orally once a day Active  hyoscyamine 0.375 mg oral tablet, extended release 1 tab(s) orally every 12 hours Active  potassium chloride 20 mEq oral tablet, extended release 1 tab(s) orally once a day Active  Lyrica 75 mg oral capsule 1 cap(s) orally 2 times a day Active  Dexilant 60 mg oral delayed release capsule 1 cap(s) orally once a day Active  Symbicort 80 mcg-4.5 mcg/inh inhalation aerosol 2 puff(s) inhaled 2 times a day Active  tamsulosin 0.4 mg oral capsule 1 cap(s) orally once a day Active  HumaLOG 100 units/mL subcutaneous solution  24 unit(s) subcutaneous at breakfast, 22 units at lunch, and 14 units at supper Active  Lantus 100 units/mL subcutaneous solution 26 unit(s) subcutaneous once a day in the morning Active  Lantus 100 units/mL subcutaneous solution 35 unit(s) subcutaneous once a day (at bedtime) Active   Lab Results:  Hepatic:  12-Nov-15 09:32   Bilirubin, Total 0.6  Alkaline Phosphatase 104 (46-116 NOTE: New Reference Range 02/07/14)  SGPT (ALT) 17 (14-63 NOTE: New Reference Range 02/07/14)  SGOT (AST) 19  Total Protein, Serum 7.0  Albumin, Serum  3.1  Routine Chem:  12-Nov-15 09:32   B-Type Natriuretic Peptide ([facility])  731 (Result(s) reported on 01 Jun 2014 at 11:09AM.)  Lipase  56 (Result(s) reported on 01 Jun 2014 at 10:32AM.)  Glucose, Serum  236  BUN  63  Creatinine (comp)  3.36  Sodium, Serum 138  Potassium, Serum  3.0  Chloride, Serum  93  CO2, Serum  35  Calcium (Total), Serum  7.8  Osmolality (calc) 301  eGFR (African American)  23  eGFR (Non-African American)  19 (eGFR values <25mL/min/1.73 m2 may be an indication of chronic kidney disease (CKD). Calculated eGFR, using the MRDR Study equation, is useful in  patients with stable renal function. The eGFR calculation will not be reliable in acutely ill patients when serum creatinine is changing rapidly. It is not useful in patients on dialysis. The eGFR calculation may not be applicable to patients at the low and high extremes of body sizes, pregnant women, and vegetarians.)  Anion Gap 10  Cardiac:  12-Nov-15 09:32   Troponin I 0.04 (0.00-0.05 0.05 ng/mL or less: NEGATIVE  Repeat testing in 3-6 hrs  if clinically indicated. >0.05 ng/mL: POTENTIAL  MYOCARDIAL INJURY. Repeat  testing in 3-6 hrs if  clinically indicated. NOTE: An increase or decrease  of 30% or more on serial  testing suggests a  clinically important change)  Routine Hem:  12-Nov-15 09:32   WBC (CBC) 9.9  RBC (CBC)  3.37  Hemoglobin (CBC)  10.1   Hematocrit (CBC)  29.7  Platelet Count (CBC)  111 (Result(s) reported on 01 Jun 2014 at 10:33AM.)  MCV 88  MCH 29.9  MCHC 33.9  RDW 13.8   EKG:  EKG Interp. by me   Interpretation EKG shows NSR, 79, RBBB, TWI III, aVF   Radiology Results: [place]:    12-Nov-15 12:34, US Kidney Bilateral  US Kidney Bilateral   REASON FOR EXAM:    ARF/CKD  COMMENTS:       PROCEDURE: [place]  - US KIDNEY  - Jun 01 2014 12:34PM     CLINICAL DATA:  Acute renal failure, chronic kidney disease    EXAM:  RENAL/URINARY TRACT ULTRASOUND COMPLETE    COMPARISON:  CT abdomen and pelvis 06/01/2014    FINDINGS:  Right Kidney:  Length: 12.7 cm. Normal cortical thickness and echogenicity. No  mass, hydronephrosis or shadowing calcification. No perinephric  fluid.    Left Kidney:    Length: 13.5 cm. Normal cortical thickness and echogenicity. No  mass, hydronephrosis or shadowing calcification. No perinephric  fluid.    Bladder:    Normal appearance     IMPRESSION:  Normal exam.      Electronically Signed    By: Lavonia Dana M.D.    On: 06/01/2014 12:56         Verified By: Burnetta Sabin, M.D.,  CT:    12-Nov-15 10:44, CT Abdomen Pelvis WO for Stone  CT Abdomen Pelvis WO for Stone   REASON FOR EXAM:    right flank pain  COMMENTS:       PROCEDURE: CT  - CT ABDOMEN /PELVIS WO (STONE)  - Jun 01 2014 10:44AM     CLINICAL DATA:  Right flank pain, shortness of breath, and weakness  for 5 days.    EXAM:  CT ABDOMEN AND PELVIS WITHOUT CONTRAST    TECHNIQUE:  Multidetector CT imaging of the abdomen and pelvis was performed  following the standard protocol without IV contrast.  COMPARISON:  CT abdomen 10/04/2010    FINDINGS:  There is a small region of consolidation in the inferior aspect of  the right middle lobe. Subsegmental atelectasis is present in both  lower lobes. There is no pleural effusion. Three-vessel coronary  artery calcification is present. Calcified hilar lymph nodes  are  again seen.    The liver, gallbladder, spleen, right adrenal gland, and pancreas  are unremarkable. 2.2 cm left adrenal nodule is present (previously  2.0 cm). Both kidneys are mildly lobular in contour without a  discrete mass identified on this unenhanced examination. Mild  bilateral perinephric stranding is similar to the prior study. No  hydronephrosis or renal calculi are identified. No stones are  identified along the course of the ureters, which are nondilated.    2.4 cm duodenal diverticulum is again seen. There is no evidence of  bowel obstruction. There is diverticulosis of the descending and  sigmoid colon without evidence of acute diverticulitis. Appendix is  identified in the right mid abdomen and is unremarkable.  Subcentimeter calcification in the root of the mesentery is  unchanged.    Extensive atherosclerotic vascular calcification is noted. No free  fluid or enlarged lymph nodes are identified. Bladder is  unremarkable. Prostate is normal in size. There is grade 1  anterolisthesis of L4 on L5. Prior interbody and posterior fusion  has been performed at L4-5.   IMPRESSION:  1. No acute abnormality identified in the abdomen or pelvis.  2. Colonic diverticulosis without evidence of diverticulitis.  3. Stable to minimally increased size of left adrenal nodule, most  consistent with an adenoma based on enhancement characteristics on  the prior CT.  4. Bibasilar subsegmental atelectasis. Developing infection not  excluded in the right middle lobe.      Electronically Signed    By: Logan Bores    On: 06/01/2014 11:08       Verified By: Ferol Luz, M.D.,    Oxycodone: Itching  Vital Signs/Nurse's Notes:  **Vital Signs.:   12-Nov-15 12:45  Vital Signs Type Admission  Temperature Temperature (F) 98.4  Celsius 36.8  Temperature Source oral  Pulse Pulse 60  Respirations Respirations 16  Systolic BP Systolic BP 295  Diastolic BP (mmHg) Diastolic BP  (mmHg) 63  Mean BP 87  Pulse Ox % Pulse Ox % 93  Oxygen Delivery 4L    Impression 80 year old male with history of chronic diastolic CHF, IDDM with renal insufficiency, HLD, HTN, morbid obesity, thrombocytopenia, episode of a-fib in early 2014 s/p successful elective cardioversion 10/27/2012, COPD, & OSA on CPAP who presented to [facility] ED this morning after calling both our office and his PCP office earlier this week with right sided flank pain. He is on a sliding scale toresemide dosing regimen based on his weight (see HPI). Has been taking too much based on weight. Earlier this morning he was up getting ready to sit to read a magazine and felt weak. He fell and could not get up. It took about an hour for his wife to realize he had fallen. They called EMS for transport. Upon EMS arrival he was noted to have bilateral crackles. Upon his arrival to [facility] he was found to have a SCr of 3.36, pBNP 731, TnI 0.04, WBC 9.9, hgb 10.1. CXR showed Mild bibasilar atelectatic change.  No edema or consolidation. CT abdomen showed No acute abnormality identified in the abdomen or pelvis. Colonic diverticulosis without evidence of diverticulitis. Stable to minimally increased size of left adrenal nodule, most consistent with an adenoma based on enhancement characteristics on the prior CT. Bibasilar subsegmental atelectasis. Developing infection not excluded in the right middle lobe. He continues to complain of right flank pain with movement only. He is constipated. He has not voided since his arrival. Cardiology was consulted for further  evaluation.   1. COPD exacerbation: -CXR does not show signs of edema -Temp at triage 99.5, hypoxic requiring 3L via [place], sats of 80% per the nurse with minimal exertion -CT scan suggestive of possible developing infection in the right middle, would monitor. Minimal cough  2. Right flank pain acute renal on chronic kidney disease stage III bladder & kidneys normal on kidney US -Would  place a Foley if bladder scan positive for significant volume -SCr currently 3.36, up from 1.83 one month ago -He has been taking too much torsemide,  he was not following his sliding scale  based on weight, he was told to back down on torsemide 2 weeks ago but continued to taking high dose torsemide -Would hold torsemide and lightly hydrate - monitor SCr  3. Chronic diastolic CHF: -Does not appear to be acute HF at this time, BNP low -Continue Coreg 6.25 mg bid -06/2013 Echo: EF 55-60%, mild conc LVH, mildly dil LA/RA, mild-mod Ao sclerosis w/o stenosis;  b. 02/2014 Echo: EF 55-60%, mild conc LVH, mildly dil LA/RA, mild-mod Ao sclerosis w/o stenosis  4. History of PAF: -Status post cardioversion 10/27/2012 -Continue amiodarone 200 mg daily  -Continue Coreg as above  5) hypoxia: will likely need home oxygen. sats 80% here   Electronic Signatures: Rise Mu (PA-C)  (Signed 12-Nov-15 14:46)  Authored: General Aspect/Present Illness, History and Physical Exam, Review of System, Family & Social History, Past Medical History, Home Medications, EKG , Radiology, Allergies, Impression/Plan Ida Rogue (MD)  (Signed 12-Nov-15 15:10)  Authored: General Aspect/Present Illness, History and Physical Exam, Review of System, Family & Social History, Labs, EKG , Vital Signs/Nurse's Notes, Impression/Plan  Co-Signer: General Aspect/Present Illness, History and Physical Exam, Review of System, Family & Social History, Past Medical History, Home Medications, EKG , Radiology, Allergies, Impression/Plan   Last Updated: 12-Nov-15 15:10 by Ida Rogue (MD)

## 2014-11-11 NOTE — H&P (Signed)
PATIENT NAME:  [name], [name] MR#:  [id number] DATE OF BIRTH:  [birth year]  DATE OF ADMISSION:  01/17/2014  REFERRING PHYSICIAN:  Dr. Cyril Rollins.   PRIMARY CARE PHYSICIAN:  Dr. Dale Rollins.   PRIMARY CARDIOLOGIST:  Dr. Mariah Rollins.   CHIEF COMPLAINT:  Chest pain.   HISTORY OF PRESENT ILLNESS:  The patient is an obese 80 year old male with a history of diastolic CHF, diabetes, CKD, chronic thrombocytopenia and history of A. Fib.  Of note, he complained of an episode of chest pain starting yesterday with a shot to the left hand, then came back across and then went to the right.  Chest pain on the left side is resolved, but the patient has some residual pain on the right pectoral muscle area.  This was associated with shortness of breath, a little weak and dizzy.  Therefore he came into the hospital.  Initial troponins negative.  EKG shows new right bundle whereas before it was no bundle branch block.  He has no left-sided chest pain at this point, but he does have some right chest pain and some spasms.  Hospitalist services were contacted for further evaluation and management.   PAST MEDICAL HISTORY:  History of COPD, diabetes, hypertension, chronic A. Fib, hyperlipidemia, depression, obstructive sleep apnea, on CPAP, chronic thrombocytopenia, diastolic CHF, CKD appears to be at least stage 4, chronic anemia.   ALLERGIES:  OXYCODONE, WHICH HE STATES ONLY CAUSES HIM TO HAVE ITCHING.   OUTPATIENT MEDICATIONS:  Amiodarone 200 mg once a day, atenolol 50 mg three times a day, atorvastatin 20 mg once a day at bedtime, clonidine 0.1 mg 2 times a day, Dexilant 60 mg once a day, citalopram 5 mg once a day, hyoscyamine 3.75 mg extended-release once every 12 hours, Januvia 100 mg once a day, Lantus 26 units in the morning and 46 units in the evening, Lyrica 75 mg 2 times a day, oxycodone 5 mg 2 times a day as needed, potassium chloride 20 mEq once a day, Symbicort 2 puffs 2 times a day, tamsulosin 0.4 mg once a day,  tizanidine 0.4 mg 1/2 tab to one cap once a day at bedtime as needed, torsemide 40 mg once a day in the morning and 20 mg in the evening.   FAMILY HISTORY:  Dad had heart failure.  Mom dementia.  A sister with MI.  Grandfather also had MI.   SOCIAL HISTORY:  No tobacco, alcohol use.  Is retired.   REVIEW OF SYSTEMS:  CONSTITUTIONAL:  Positive for some weakness.  EYES:  Has had some double vision in the mornings for a few weeks, is going to go see ophthalmology next week.   EARS, NOSE, THROAT:  Has some chronic tinnitus.  RESPIRATORY:  Chronic dyspnea on exertion and shortness of breath, is not increased from baseline.  Has a little dry cough, occasional wheezing, cannot walk more than a block, has swelling in the legs which he states is better for the last month.  GASTROINTESTINAL:  No nausea or vomiting or diarrhea.  Has chronic constipation.  No bloody or tarry stools.  GENITOURINARY:  Denies dysuria, hematuria.  ENDOCRINE:  Has polydipsia, drinks about 90 to 120 ounces of water daily, in excess to coffee and tea, multiple cups a day.  HEMATOLOGIC:  Positive for easy bruising.  SKIN:  No rashes.  MUSCULOSKELETAL:  Has chronic arthritis and some chronic bilateral shoulder pain.  NEUROLOGIC:  No focal weakness.  Has numbness in the right lower extremity chronically.  PSYCHIATRIC:  No anxiety or depression.   PHYSICAL EXAMINATION: VITAL SIGNS:  Temperature 98.2.  Initial pulse rate 57, respiratory rate is 24, blood pressure 154/68, O2 sat 93% on room air.  GENERAL:  The patient is an obese male sitting at the edge of the bed, no obvious distress, talking in full sentences.  HEENT:  Normocephalic, atraumatic.  Pupils are equal and reactive.  Anicteric sclerae.  Moist mucous membranes.  NECK:  Supple.  HEART:  Normal S1, S2, somewhat bradycardic.  LUNGS:  Clear upper bases.  Lower bases bilaterally there is mild fine crackles.  ABDOMEN:  Soft, very obese, nontender, nondistended.   EXTREMITIES:  Has 3+ edema to mid-shins.  NEUROLOGIC:  Cranial nerves II through XII grossly intact.  Strength is five out of five in all extremities and sensation is intact to light touch.  PSYCHIATRIC:  Awake, alert, oriented x 3.   LABORATORIES AND IMAGING STUDIES:  Glucose is 186.  BNP is 548, BUN 21, creatinine 2.15, sodium 136, potassium 4.  Troponin negative.  White count of 6.7, hemoglobin 9.4, platelets are 100.  EKG:  Sinus brady with first degree AV block, right bundle branch block.  No acute ST elevations or depressions.  X-ray of the chest, PA and lateral, showing cardiomegaly with pulmonary vascular congestion and possible mild interstitial edema.   ASSESSMENT AND PLAN:  We have a 80 year old with multiple chronic medical issues including chronic kidney disease stage 4, diastolic congestive heart failure, hypertension, diabetes here with typical and atypical chest pains.  Typical chest appears to be resolved, but the patient has some right-sided chest pain now.  We would admit the patient to the hospital, rule out acute coronary syndrome given the multiple risk factors and obtain cardiology consult, cycle cardiac markers.  We will start the patient on aspirin.  Continue the beta blocker and the statin and if he does rule out for acute coronary syndrome, a stress test has been ordered for tomorrow.  We would also continue his outpatient torsemide, obtain a congestive heart failure clinic appointment as he does not appear to abide by any fluid restriction.  Would check a hemoglobin A1c, lipid profile as well as a TSH.  Continue insulin.  Add a nitroglycerin patch.   CODE STATUS:  THE PATIENT IS A FULL CODE.   Total time spent about 40 minutes.   ____________________________ Robert EatonShayiq Ahmadzia, Rollins sa:ea D: 01/17/2014 16:23:19 ET T: 01/17/2014 17:44:31 ET JOB#: [id number]  cc: Robert EatonShayiq Ahmadzia, Rollins, <Dictator> Robert Ibaimothy J. Gollan, Rollins Robert Durhamharlene Scott, Rollins [name] ELECTRONICALLY  SIGNED 01/27/2014 17:13

## 2014-11-11 NOTE — H&P (Signed)
PATIENT NAME:  [name], [name] MR#:  [id number] DATE OF BIRTH:  May 30, 1935  DATE OF ADMISSION:  06/01/2014  PRIMARY CARE PHYSICIAN: Dale Metcalfe, MD.   REFERRING PHYSICIAN: Dr.Schaevitz, ED physician.   CHIEF COMPLAINT: Right flank pain and shortness of breath for the past 3 days.   HISTORY OF PRESENT ILLNESS: A 80 year old Caucasian male with a history of hypertension, diabetes, CKD, diastolic CHF presented to the ED for the above chief complaint. The patient is alert, awake, oriented, in no acute distress. The patient has had right flank pain for the past 3 days which is almost constant and sharp without radiation. The patient denies any fevers or chills. No dysuria, hematuria, or incontinence. Flank pain exacerbated by movement. The patient also complains of shortness of breath and wheezing for the past 3 days. He denies any chest pain, palpitations, orthopnea, or nocturnal dyspnea, but has leg edema. He said his leg edema is better than before. The patient came to the ED for further evaluation. He was noticed to have increased creatinine and BUN. He was treated with IV fluids and Solu-Medrol nebulizer.   PAST MEDICAL HISTORY:  1.  COPD.  2.  Chronic atrial fibrillation, status post cardioversion. Not on anticoagulation.  3.  Hypertension.  4.  Diabetes.  5.  OSA on CPAP.  6.  Chronic diastolic CHF.   7.  CKD stage IV.  8.  Anemia of chronic disease.   PAST SURGICAL HISTORY: Right knee surgery, bilateral shoulder arthroscopy procedures, lower back surgery.   FAMILY HISTORY: Significant for heart failure in his mother and grandfather. His mother had dementia.   ALLERGIES: OXYCODONE.   HOME MEDICATIONS: Torsemide 20 mg 2 tablets b.i.d., Flomax 0.4 mg p.o. daily, Symbicort 80/425 mcg inhalation 2 puffs b.i.d., potassium chloride 20 mEq 1 tablet p.o. daily, Lyrica 75 mg p.o. b.i.d., Lantus 26 units once a day in the morning 35 units at bedtime, hyoscyamine 0.375 mg 1 tablet every 12  hours, Humalog 23 units subcutaneous at breakfast, 22 units at lunch, and 14 units at supper, escitalopram 5 mg p.o. daily, Dexilant 60 mg p.o. daily, clonidine 0.1 mg p.o. b.i.d., Coreg 6.25 mg p.o. b.i.d., atorvastatin 20 mg p.o. at bedtime, Norvasc 5 mg p.o. daily, amiodarone 200 mg p.o. daily.   REVIEW OF SYSTEMS:  CONSTITUTIONAL: The patient denies any fever or chills. No headache or dizziness, but has weakness.  EYES: No double vision, blurry vision.   ENT: No postnasal drip, slurred speech, or dysphagia.  CARDIOVASCULAR: No chest pain, palpitation, orthopnea, or nocturnal dyspnea, but has leg edema.  PULMONARY: Positive for cough, shortness of breath, but no sputum or hemoptysis. The patient has wheezing.  GASTROINTESTINAL: No abdominal pain, nausea, vomiting, or diarrhea but has right flank pain without radiation.  GENITOURINARY: No dysuria, hematuria, or incontinence.  SKIN: No rash or jaundice.  NEUROLOGY: No syncope, loss of consciousness, or seizure.  ENDOCRINE: No polyuria, polydipsia, heat or cold intolerance.  HEMATOLOGY: No easy bleeding or bleeding.    PHYSICAL EXAMINATION: VITAL SIGNS: Temperature 98.6, blood pressure 130/56, pulse 65, oxygen saturation 99% on oxygen.  GENERAL: The patient is alert, awake, oriented, in no acute distress.  HEENT: Pupils round, equal, and reactive to light and accommodation.   NECK: Supple. No JVD or carotid bruit. No lymphadenopathy. No thyromegaly.  CARDIOVASCULAR: S1, S2 regular rate and rhythm. No murmurs or gallops.  PULMONARY: Bilateral air entry. Mild expiratory wheezing with crackles on both sides.  No use of accessory muscles to breathe.  ABDOMEN: Obese, soft. No distention. No tenderness. No organomegaly. Bowel sounds present. No right flank tenderness.  EXTREMITIES: Bilateral lower extremity edema 1 to 2+. No clubbing or cyanosis. No calf tenderness. Bilateral pedal pulses present.  NEUROLOGIC: A and O x 3. No focal deficit. Power  5/5. Sensation intact.   LABORATORY DATA: Ultrasound of kidney and bladder is normal. CAT scan of abdomen and pelvis with no acute abnormality, colonic diverticulosis without evidence of diverticulitis, bibasilar subsegmental atelectasis. Chest x-ray, mild basilar atelectasis changes. No edema or consolidation WBC 9.9, hemoglobin 10.1, platelets 111,000. Lipase 56, glucose 236, BUN 36, creatinine 3.36, sodium 138, potassium 3.0, chloride 93, bicarbonate 35, troponin 0.04. BNP 731. EKG showed normal sinus rhythm at 79 BPM with RBBB.   IMPRESSIONS: 1.  Acute renal failure, on chronic kidney disease.  2.  Acute chronic obstructive pulmonary disease exacerbation.  3.  History of chronic diastolic congestive heart failure.  4.  Hypertension.  5.  Diabetes  6.  Obstructive sleep apnea, on CPAP.  7.  Anemia of chronic disease.  8.  Hypokalemia   PLAN OF TREATMENT: 1.  The patient will be admitted to medical floor with telemonitor. For acute renal failure, we will hold torsemide and give gentle IV fluid support. Follow up BMP.  2.  For COPD exacerbation, we will give DuoNebs, Solu-Medrol, and continue Symbicort.  3.  Hypertension. Continue the patient's home medications of clonidine, Coreg, but hold torsemide due to acute renal failure.  4.  For diabetes, we will start a sliding scale and Levemir.   5.  We will get a nephrology and cardiology consult.  6.  I discussed the patient's condition and plan of treatment with the patient.   TIME SPENT: About 63 minutes.    ____________________________ Shaune PollackQing Merwyn Hodapp, MD qc:at D: 06/01/2014 13:43:17 ET T: 06/01/2014 14:26:48 ET JOB#: [id number]  cc: Shaune PollackQing Zuleica Seith, MD, <Dictator> Shaune PollackQING Arasely Akkerman MD ELECTRONICALLY SIGNED 06/02/2014 15:36

## 2014-11-11 NOTE — Discharge Summary (Signed)
PATIENT NAME:  [name], [name] MR#:  [id number] DATE OF BIRTH:  Nov 22, 1934  DATE OF ADMISSION:  03/24/2014  DATE OF DISCHARGE:  03/27/2014   CONSULTATION: Cardiology, Lorine BearsMuhammad Arida, MD   DISCHARGE DIAGNOSES:  1.  Acute on chronic diastolic congestive heart failure. 2.  History of paroxysmal atrial fibrillation. 3.  Chronic kidney disease stage III. 4.  Hypertension. 5.  Diabetes. 6.  Obstructive sleep apnea.   CONDITION: Stable.   CODE STATUS: FULL CODE.   HOME MEDICATIONS: Please refer to the medication reconciliation list.   DIET: Low-sodium, low-fat, low-cholesterol diet.   ACTIVITY: As tolerated.   FOLLOWUP CARE:  Followup with PCP within 1 to 2 weeks. Follow up with Dr. Kirke CorinArida and Dr. Cherylann RatelLateef within 1 to 2 weeks.   REASON FOR ADMISSION: Worsening dyspnea on exertion.   HOSPITAL COURSE: The patient is a 80 year old Caucasian male with a history of COPD, chronic atrial fibrillation not on anticoagulation, hypertension, diabetes, history of diastolic CHF, who presented to the ED with shortness of breath on exertion, and worsening leg edema.  For detailed history and the physical examination, please refer to the admission note dictated by Dr. Nemiah CommanderKalisetti.     LABORATORY DATA ON ADMISSION:  Showed BUN 23, creatinine 2.06.  Electrolytes were normal.  Chest x-ray shows pulmonary edema, congestion.    HOSPITAL COURSE: The patient was admitted for acute on chronic diastolic CHF.  After admission, the patient was treated with Lasix 80 mg IV b.i.d. with potassium supplement.  Echocardiograph showed ejection fraction 54% to 55%.  Lasix was decreased to 40 mg b.i.d. due to worsening renal function.  Dr. Kirke CorinArida evaluated the patient and suggests that the patient may change to torsemide 40 mg p.o. b.i.d. after discharge.  The patient's symptoms have much improved. He denies any cough or shortness of breath, but still has leg swelling.   Physical examination shows mild bilateral basilar rales.  Lung sounds are much better.   ASSESSMENT AND PLAN: 1.  Chronic atrial fibrillation. The patient is not on anticoagulation according to cardiologist, but the patient is on amiodarone and Dr. Kirke CorinArida changed from atenolol to Coreg and decreased the Coreg dose to 6.25 mg p.o. b.i.d. due to bradycardia.  2.  Sleep apnea, the patient is on continuous process airway pressure at home.  3.  Diabetes. Controlled with sliding scale Levemir.   4.  Chronic kidney disease stage III.  The patient's renal function was a little bit worse after Lasix 80 mg IV b.i.d., so Lasix was decreased to 40 mg b.i.d.  Renal function decreased to his baseline. The patient's vital signs are stable. He is clinically stable and will be discharged to home today.    This consultation and discharge plan discussed with the patient, nurse, case manager, and Dr. Kirke CorinArida.     TIME SPENT: About 38 minutes.     ____________________________ Shaune PollackQing Cataleyah Colborn, MD qc:DT D: 03/27/2014 13:39:48 ET Rollins: 03/27/2014 14:49:16 ET JOB#: [id number]  cc: Shaune PollackQing Taelyn Broecker, MD, <Dictator> Shaune PollackQING Farrin Shadle MD ELECTRONICALLY SIGNED 03/27/2014 16:00

## 2014-11-11 NOTE — Consult Note (Signed)
PATIENT NAME:  [name], [name] MR#:  [id number] DATE OF BIRTH:  09-17-1934  DATE OF CONSULTATION:  06/16/2014  CONSULTING PHYSICIAN:  Pauletta BrownsYuriy Orphia Mctigue, MD  REASON FOR CONSULTATION: Headaches.   HISTORY OF PRESENT ILLNESS: This is a 80 year old gentleman with past medical history of chronic obstructive pulmonary disease, chronic atrial fibrillation and congestive heart failure, obstructive sleep apnea, status post readmission for shortness of breath, suspected chronic obstructive pulmonary disease exacerbation found to have a right lung pneumonia suspected to be community-acquired pneumonia and started on antibiotics, as well as steroids. This morning, patient complained of diffuse pressure-like headache in the posterior aspect of his head associated with periods of nausea but no vomiting. No photophobia, no phonophobia. The patient does not regularly have headaches. On further examination, the patient did have elevated blood pressure with systolic of 188 over diastolic 80. Currently  blood pressure is better controlled. Clonidine has been increased and the patient's blood pressure has improved and no further headaches at this point.   PAST MEDICAL HISTORY: COPD, Atrial fibrillation, hypertension, diabetes, obstructive sleep apnea, chronic diastolic congestive heart failure, anemia of chronic disease.   PAST SURGICAL HISTORY: Right knee surgery.   FAMILY HISTORY: Significant for heart failure.   IMAGING:   Has been reviewed.   LABORATORY DATA: Work-up has been reviewed.    ALLERGIES:  OXYCODONE.   REVIEW OF SYSTEMS: Currently, positive shortness of breath but has improved. No fevers. Currently, no chest pain and slight abdominal discomfort. The patient states he has not had a problem. Colace has been ordered. No anxiety. No depression.   PHYSICAL EXAMINATION:  VITAL SIGNS: Include a blood pressure 166/72 down from 188/80 earlier. Temperature of 97.6, pulse 73, respirations 20.  NEUROLOGIC:   Speech appears to be fluent. No signs of dysarthria. No signs of aphasia. extraocular movements are intact. Visual fields are intact. Facial sensation is intact. Facial motor is intact. Tongue is midline. Uvula elevates symmetrically. Shoulder shrug intact. Motor strength appears to be 5/5 bilaterally, upper and lower extremities. Sensation intact. Reflexes diminished throughout. Coordination intact. Gait could not be assessed.   IMPRESSION: A 80 year old gentleman with history of atrial fibrillation, chronic obstructive pulmonary disease admitted with hypoxic respiratory failure, found to have a right lung pneumonia, on antibiotics complaining of a headache which has improved.   PLAN: I think this is hypertensive urgency, blood pressure improved. Headache has resolved. I would not start any treatment at this point. From a neurological standpoint, would strongly consider anticoagulation as outpatient and due to high risk of strokes and specifically with chronic hypertension, atrial fibrillation, diastolic heart failure, obesity, obstructive sleep apnea, sedentary lifestyle.    It was a pleasure seeing this patient. Please call with any questions.     ____________________________ Pauletta BrownsYuriy Taryn Shellhammer, MD yz:kl D: 06/16/2014 16:37:12 ET Rollins: 06/16/2014 16:55:45 ET JOB#: [id number]  cc: Pauletta BrownsYuriy Kallon Caylor, MD, <Dictator> Pauletta BrownsYURIY Jamil Armwood MD ELECTRONICALLY SIGNED 06/19/2014 13:39

## 2014-11-11 NOTE — H&P (Signed)
PATIENT NAME:  [name], THELEN MR#:  [id number] DATE OF BIRTH:  July 08, 1935  DATE OF ADMISSION:  06/13/2014  PRIMARY CARE PHYSICIAN: Dale Issaquah, MD.  REFERRING EMERGENCY ROOM  PHYSICIAN: Sheran Fava. Fanny Bien, MD.   CHIEF COMPLAINT: Shortness of breath.   HISTORY OF PRESENT ILLNESS: A 80 year old male who has a history of COPD, chronic atrial fibrillation recently discharged from hospital after having acute worsening of renal function, discharged home with oxygen. Also has diabetes, hypertension, and no obstructive sleep apnea, CPAP and diastolic CHF.  He stated that last 1 week, initially, he was feeling better, but his daughter had some sore throat and after that, last 1 or 2 days, he started feeling some shortness of breath, so he also had some cough and feeling very short of breath, so instead of calling his doctor, decided to come to the Emergency Room, and on arrival, he was found having some hypoxia, tachypnea and pneumonia evidence on chest x-ray, which is new compared to last week chest x-ray, so given to hospitalist team for further management. On questioning, he denies any fever or chills. He denies any nausea or vomiting.   REVIEW OF SYSTEMS:  CONSTITUTIONAL: Negative for fever, fatigue, generalized weakness, weight loss or weight gain.  EYES: No blurring, double vision, discharge or redness.  EARS, NOSE, THROAT: No tinnitus, ear pain or hearing loss.  RESPIRATORY: Patient has some cough and feeling short of breath, mild wheezing.  CARDIOVASCULAR: No chest pain, orthopnea, edema, arrhythmia, palpitations.  GASTROINTESTINAL: No nausea, vomiting, diarrhea, abdominal pain.  GENITOURINARY: No dysuria, hematuria, increased frequency.  ENDOCRINE: No heat or cold intolerance. No excessive sweating.  SKIN: No acne, rashes, or lesions.  MUSCULOSKELETAL: No pain or swelling in the joints.  NEUROLOGICAL: No numbness, weakness, tremor or vertigo.  PSYCHIATRIC: No anxiety, insomnia, bipolar  disorder.   PAST MEDICAL HISTORY: 1.  COPD.  2.  Chronic atrial fibrillation status post cardioversion. No anticoagulation.  3.  Hypertension.  4.  Diabetes.  5.  Obstructive sleep apnea on CPAP.  6.  Chronic diastolic CHF. 7.  CKD stage IV.  8.  Anemia of chronic disease.    PAST SURGICAL HISTORY:  Right knee surgery, bilateral shoulder arthroscopic procedure, lower back surgery.   FAMILY HISTORY: Significant for heart failure in mother and grandfather and mother had dementia.  ALLERGIES:  ALLERGIC TO OXYCODONE.   SOCIAL HISTORY: Denies smoking. Lives at home. Retired, was working in [employer].   PHYSICAL EXAMINATION: VITAL SIGNS: In ER, temperature 98.3, pulse 97, respirations 27, blood pressure 172/92, pulse oximetry is 92 on oxygen supplementation.  GENERAL: The patient is fully alert and oriented. He is obese. Does appear in mild distress, due to respiratory issues, but cooperative with history taking and physical examination.  HEENT: Head and neck atraumatic. Conjunctivae pink. Oral mucosa moist.  NECK: Supple. No JVD.  RESPIRATORY: Bilateral equal air entry wheezing and some crepitation present.  CARDIOVASCULAR: S1, S2 present, regular. No murmur.  ABDOMEN: Soft, nontender. Bowel sounds present. No organomegaly.  SKIN: No acne, rashes, or lesions.  LEGS: No edema.  NEUROLOGICAL: Power 4/5. No tremor or rigidity. No numbness. Follows commands. No dilated JVD.   JOINTS: No swelling or tenderness.  PSYCHIATRIC: Does not appear in any acute psychiatric illness at this time.   IMPORTANT LABORATORY DATA:   Chest x-ray, portable, single view, shows a new patchy airspace disease in right midlung worrisome for pneumonia.     Glucose 165, BUN 38, creatinine 2.23, sodium 143,  potassium 3.6, chloride 107, CO2 of 31. Calcium 7.9.   Total protein 6.7, albumin 2.9, bilirubin 0.7, SGOT 36, and SGPT 33.   Troponin 0.08.   WBC 17.1, hemoglobin 9.7, platelet count 136,000  and MCV is 89.   ASSESSMENT AND PLAN: A 80 year old male with a past history of chronic obstructive pulmonary disease, atrial fibrillation, hypertension, diabetes, continuous positive airway pressure obstructive sleep apnea, chronic kidney disease, and diastolic congestive heart failure, came to Emergency Room with some cough and shortness of breath. 1.  Acute on chronic respiratory failure. Last week he was discharged with 3 liters oxygen supplementation at home feeling some shortness of breath now. This is secondary to pneumonia, which is new finding on chest x-ray.  Will give Rocephin and azithromycin and get sputum culture.  2.  Chronic obstructive pulmonary disease exacerbation. There is slight wheezing also present, so we will have to start him on IV steroid and nebulizer therapy. We might be able to switch steroid to oral weekly once his pneumonia comes under control.  3.  Chronic kidney disease, stage IV.  His creatinine was more than 3 last week, which appears to be getting better today, so no further intervention needed. We will try to avoid nephrotoxic medications.  4.  Diabetes. Will decrease the dose of baseline Lantus, what he was getting at home and will keep him on insulin sliding scale coverage.  5.  Diastolic heart failure. This is chronic. Currently, he is not in any heart failure. Will continue monitoring.  6.  Hypertension and atrial fibrillation.  He is not on any anticoagulation at baseline. We will continue carvedilol as he is taking at home.   CODE STATUS: Full code.   TOTAL TIME SPENT ON THIS ADMISSION: 50 minutes.    ____________________________ Hope PigeonVaibhavkumar G. Elisabeth PigeonVachhani, MD vgv:DT D: 06/13/2014 12:33:28 ET T: 06/13/2014 12:54:54 ET JOB#: [id number]  cc: Hope PigeonVaibhavkumar G. Elisabeth PigeonVachhani, MD, <Dictator> Dale Durhamharlene Scott, MD Altamese DillingVAIBHAVKUMAR Kharizma Lesnick MD ELECTRONICALLY SIGNED 06/19/2014 9:01

## 2014-11-11 NOTE — Consult Note (Signed)
General Aspect Primary Care Provider:  Charm BargesSCOTT,CHARLENE S, MD Primary Cardiologist:  Concha Se. Gollan, MD    Patient Profile   [age] y/o male with a h/o diastolic chf, paf, and ckd iii, who presents today with a 1 month h/o progressive volume overload and dyspnea.   Problem List    Past Medical History   Diagnosis  Date   ???  Arthritis     ???  Sleep apnea         a. On CPAP; b. Severe OSA by sleep study 01/2014.   ???  COPD (chronic obstructive pulmonary disease)     ???  Hypercholesterolemia     ???  Hypertension     ???  Thrombocytopenia     ???  CKD (chronic kidney disease), stage III     ???  Diverticulosis     ???  History of colon polyps     ???  Type II diabetes mellitus     ???  Chronic diastolic CHF (congestive heart failure), NYHA class 3         a. 06/2013 Echo: EF 55-60%, mild conc LVH, mildly dil LA/RA, mild-mod Ao sclerosis w/o stenosis.   ???  Chest pain         a. 01/2013 Stress only myoview: low risk->Med Rx.   ???  PAF (paroxysmal atrial fibrillation)         a. On amio.  Previously on Xarelto but self discontinued 2/2 concern r/t swelling.       Past Surgical History   Procedure  Laterality  Date   ???  Cataract extraction           Left eye   ???  Back surgery    2000   ???  Trigger finger release  Left     ???  Colonoscopy       ???  Cardioversion       ???  Medial partial knee replacement    06/2013       right    Allergies    Allergies   Allergen  Reactions   ???  Nsaids         Other reaction(s): Other (See Comments) Renal insufficiency   ???  Oxycodone     ???  Tetanus Immune Globulin         Other reaction(s): UNKNOWN   ???  Tramadol  ______________   Present Illness HPI   80 y/o male with the above complex problem list.  He has a h/o chronic diastolic chf in the setting of underlying htn, morbid obesity, and OSA. He was last admitted [facility] in July for chest and shoulder pain.  He underwent stress testing during that admission, which was  low-risk with nl LV fxn and he was discharged home.  Over the past month, he has been experiencing progressive bilateral lower ext edema, now extending into thighs/scrotum, increasing abd girth, 10+ lb wt gain, DOE, and orthopnea.  He has been on torsemide 20mg  bid and called the office earlier this week to report his Ss.  He was advised to present to the ED for admission but refused and instead made an appt to be seen today.  Since Wednesday, he has been taking torsemide 40mg  bid, but has not noticed any significant change in his volume status. ________________  Family History    Family History   Problem  Relation  Age of Onset   ???  Arthritis  Mother     ???  Heart disease  Mother     ???  Hypertension  Mother     ???  Diabetes  Mother     ???  Arthritis  Father     ???  Cancer  Father         prostate   ???  Diabetes  Sister     ???  Heart disease  Maternal Grandmother     ???  Diabetes  Maternal Grandmother     ???  Heart disease  Maternal Grandfather      _____________________________  Social History    Social History   ???  Marital Status:  Married       Spouse Name:  N/A       Number of Children:  N/A   ???  Years of Education:  N/A       Occupational History   ???  Not on file.       Social History Main Topics   ???  Smoking status:  Former Smoker -- 1.00 packs/day for 25 years       Types:  Cigarettes       Quit date:  07/21/1984   ???  Smokeless tobacco:  Never Used   ???  Alcohol Use:  No   ???  Drug Use:  No   ???  Sexual Activity:  Not on file       Other Topics  Concern   ???  Not on file       Social History Narrative     Lives in [place] with his wife and dtr.  He does not routinely exercise.   ____________________   Review of Systems General:  No chills, fever, night sweats or weight changes.   Cardiovascular:  No chest pain, +++dyspnea on exertion, +++ edema, +++ orthopnea, no palpitations, +++ paroxysmal nocturnal dyspnea. Dermatological:  No rash, lesions/masses Respiratory: +++ cough, +++ dyspnea Urologic: No hematuria, dysuria Abdominal:   +++ increasing abd girth.  No nausea, vomiting, diarrhea, bright red blood per rectum, melena, or hematemesis Neurologic:  No visual changes, wkns, changes in mental status. All other systems reviewed and are otherwise negative except as noted above.  __________________  Physical Exam   Blood pressure 154/64, pulse 60, height  (1.854 m), weight 315 lb (142.883 kg).  General: Pleasant, NAD Psych: Normal affect. Neuro: Alert and oriented X 3. Moves all extremities spontaneously. HEENT: Normal         Neck: Supple without bruits.  JVP difficult to ascertain 2/2 girth.  Pt is not able to lie back for better assessment. Lungs:  Resp regular.  Speech is labored.  Bibasilar crackles. Heart: RRR no s3, s4, or murmurs. Abdomen: Soft, non-tender, non-distended, BS + x 4.   Extremities: No clubbing, cyanosis or edema. DP/PT/Radials 2+ and equal bilaterally. _________________________  Accessory Clinical Findings   ECG - RSR, 60, 1st deg AVB, RBBB. No acute st/t changes. ______________________    Impression Assessment & Plan   1.  Acute on chronic diastolic CHF:  Pt presents today with a 1 month h/o progressive DOE, orthopnea, pnd, increasing abd girth, 10+ lb wt gain, and lower ext edema now extending into his thighs, buttocks, and scrotum.  He has not noted any significant relief in Ss or wt loss despite doubling up his outpt diuretics over the past 2.5 days.  He has significant volume overload on exam.  I have recommended inpatient admission for aggressive IV diuresis and close monitoring of renal function.  He  is agreeable.  We have contacted bed control and also the admitting physician with the hospitalist team @ [facility] and have arranged for a direct admission today.  Recommend lasix  IV BID over the weekend and continuation of HR/BP home meds.   2. HTN:  BP modestly elevated  in clinic today. Cont home doses of bb, clonidine.  Follow with aggressive diuresis.   3.  CKD III:  Baseline creat has more recently been trending around 2.  Follow with diuresis.   4.  Severe OSA:  Uses CPAP @ home.   5.  Morbid Obesity:  Would benefit from nutritional counseling.  Activity is pretty limited @ home.   6.  PAF:  In sinus and on amio and bb.  He is not currently on anticoagulation, having taken himself off of xarelto earlier this year b/c it was responsible for lower extremity swelling.  He may be willing to retry this and we can address this while hospitalized over the weekend.  Of note, he does have a h/o gastritis, anemia, and thrombocytopenia.   7.  H/O Chest Pain:  Non-ischemic MV in July.     Nicolasa Ducking, NP 03/24/2014, 3:29 PM   Electronic Signatures: Ok Anis (NP)   (Signed 04-Sep-15 15:47)  Authored: General Aspect/Present Illness, Home Medications, Allergies, Impression/Plan Nahser, Antony Blackbird (MD)   (Signed 08-Sep-15 13:58)  Co-Signer: General Aspect/Present Illness, Home Medications, Allergies, Impression/Plan  Last Updated: 08-Sep-15 13:58 by Elease Hashimoto Antony Blackbird (MD)

## 2014-11-11 NOTE — Discharge Summary (Signed)
Dates of Admission and Diagnosis:  Date of Admission 01-Jun-2014   Date of Discharge 03-Jun-2014   Admitting Diagnosis Chronic Obstructive Pulmonary Disease exacerbation   Final Diagnosis Acute on chronic renal failure  Chronic Obstructive Pulmonary Disease exacerbation Chronic diastolic heart failure Hypertension Diabetes mellitus Sleep Apnea    Chief Complaint/History of Present Illness A 80 year old Caucasian male with a history of hypertension, diabetes, CKD, diastolic CHF presented to the ED for the above chief complaint. The patient is alert, awake, oriented, in no acute distress. The patient has had right flank pain for the past 3 days which is almost constant and sharp without radiation. The patient denies any fevers or chills. No dysuria, hematuria, or incontinence. Flank pain exacerbated by movement. The patient also complains of shortness of breath and wheezing for the past 3 days. He denies any chest pain, palpitations, orthopnea, or nocturnal dyspnea, but has leg edema. He said his leg edema is better than before. The patient came to the ED for further evaluation. He was noticed to have increased creatinine and BUN. He was treated with IV fluids and Solu-Medrol nebulizer.   Allergies:  Oxycodone: Itching  Pertinent Past History:  Pertinent Past History 1.  COPD.  2.  Chronic atrial fibrillation, status post cardioversion. Not on anticoagulation.  3.  Hypertension.  4.  Diabetes.  5.  OSA on CPAP.  6.  Chronic diastolic CHF.   7.  CKD stage IV.  8.  Anemia of chronic disease.   Hospital Course:  Hospital Course 1.  Acute renal failure, on chronic kidney disease prob from overdiuresis- prob ATN  hold diuretics . Monitor for fluid overload . f/u with nephro  , improving- d/c home today. 2.  Acute chronic obstructive pulmonary disease exacerbation- . continue DuoNebs, Solu-Medrol, and continue Symbicort.     need home Oxygen- arrange for that- before d/c. 3.  History of  chronic diastolic congestive heart failure- hold torsemide due to acute renal failure.      visiting nurse to keep a check on that.  4.  Hypertension. . Continue the patient's home medications of clonidine, Coreg, 5.  Diabetes with steroid induced hyperglycemia- continue levemir and iss 6.  Obstructive sleep apnea, on CPAP.  7.  Anemia of chronic disease.  8.  Hypokalemia - replete   Condition on Discharge Stable   Code Status:  Code Status Full Code   DISCHARGE INSTRUCTIONS HOME MEDS:  Medication Reconciliation: Patient's Home Medications at Discharge:     Medication Instructions  amiodarone 200 mg oral tablet  1 tab(s) orally once a day   atorvastatin 20 mg oral tablet  1 tab(s) orally once a day (at bedtime)   clonidine 0.1 mg oral tablet  1 tab(s) orally 2 times a day   escitalopram 5 mg oral tablet  1 tab(s) orally once a day   hyoscyamine 0.375 mg oral tablet, extended release  1 tab(s) orally every 12 hours   lyrica 75 mg oral capsule  1 cap(s) orally 2 times a day   dexilant 60 mg oral delayed release capsule  1 cap(s) orally once a day   symbicort 80 mcg-4.5 mcg/inh inhalation aerosol  2 puff(s) inhaled 2 times a day   tamsulosin 0.4 mg oral capsule  1 cap(s) orally once a day   humalog 100 units/ml subcutaneous solution  24 unit(s) subcutaneous at breakfast, 22 units at lunch, and 14 units at supper   lantus 100 units/ml subcutaneous solution  35 unit(s) subcutaneous once a day (  at bedtime)   carvedilol 6.25 mg oral tablet  1 tab(s) orally 2 times a day   lantus 100 units/ml subcutaneous solution  26 unit(s) subcutaneous once a day in the morning   prednisone 10 mg oral tablet  Start at 60 mg and taper by 10 mg daily until complete   spiriva respimat 2.5 mcg/inh inhalation aerosol  2 puff(s) inhaled once a day   albuterol cfc free 90 mcg/inh inhalation aerosol  2 puff(s) inhaled 4 times a day, As Needed - for Shortness of Breath    PRESCRIPTIONS: PRINTED AND PLACED ON  CHART  STOP TAKING THE FOLLOWING MEDICATION(S):    potassium chloride 20 meq oral tablet, extended release: 1 tab(s) orally once a day amlodipine 5 mg oral tablet: 1 tab(s) orally once a day torsemide 20 mg oral tablet: 2 tab(s) orally 2 times a day  Physician's Instructions:  Home Health? Yes   Home Health Service Nurse   Home Oxygen? Yes   Oxygen delivery at home: 3L  Nasal Cannula   Diet Low Sodium  Carbohydrate Controlled (ADA) Diet   Diet Consistency Regular Consistency   Activity Limitations As tolerated   Return to Work Not Applicable   Time frame for Follow Up Appointment 1-2 weeks  Nephrology clinic   Time frame for Follow Up Appointment 1-2 weeks  Cardiology - Dr. Mariah Milling   Other Comments Follow with nephrology in 1-2 weeks to folow renal function and with cardiology to check control of DHF.     Thedore Mins, Harmeet(Consultant): [facility], [address], [place], [place] [address], [place] [phone]   Lorin Picket, Charlene(Family Physician): [facility], [address], [place], [place] [address], [place] [phone]  TIME SPENT:  Total Time: Greater than 30 minutes   Electronic Signatures: Altamese Dilling (MD)  (Signed [phone] 08:56)  Authored: ADMISSION DATE AND DIAGNOSIS, CHIEF COMPLAINT/HPI, Allergies, PERTINENT PAST HISTORY, HOSPITAL COURSE, DISCHARGE INSTRUCTIONS HOME MEDS, PATIENT INSTRUCTIONS, Follow Up Physician, TIME SPENT   Last Updated: 18-Nov-15 08:56 by Altamese Dilling (MD)

## 2014-11-11 NOTE — Discharge Summary (Signed)
PATIENT NAME:  [name], [name] MR#:  [id number] DATE OF BIRTH:  [birth year]  DATE OF ADMISSION:  06/13/2014 DATE OF DISCHARGE:  06/17/2014  DISCHARGE DIAGNOSES: 1. Multifocal pneumonia.  2. Chronic obstructive pulmonary disease exacerbation.  3. Acute on chronic hypoxic respiratory failure.  4. Chronic kidney disease stage 4.  5. Chronic diastolic heart failure.  6. Uncontrolled hypertension.  7. Headache.  8. Constipation.   SECONDARY DIAGNOSES: 1. Chronic obstructive pulmonary disease.  2. Chronic atrial fibrillation, status post cardioversion, not on any anticoagulation.  3. Hypertension.  4. Diabetes.  5. Obstructive sleep apnea on CPAP.  6. Anemia of chronic disease.   CONSULTATIONS:  1. Pulmonary, Dr. Erin Fulling.  2. Neurology, Dr. Loretha Brasil.  3. Physical therapy.   PROCEDURES AND RADIOLOGY: Chest x-ray on the 24th of November showed pneumonia. CT scan of the chest on the 27th of November showed patchy pneumonia.   LABORATORY PANEL: Blood cultures x 2 were negative. Sputum culture had many gram-negative rods with gram-positive rods. Moderate WBCs.   HISTORY AND SHORT HOSPITAL COURSE: The patient is a 80 year old male with above-mentioned medical problems who was admitted for shortness of breath and was found to have acute on chronic respiratory failure secondary to patchy multifocal pneumonia and COPD exacerbation. Please see Dr. Larose Hires dictated history and physical for further details. Pulmonary consultation was obtained with Dr. Erin Fulling who recommended CT scan of the chest, which was obtained with results dictated above. The patient had uncontrolled hypertension leading to persistent severe headache for which neurology consultation was obtained, who agreed with getting the blood pressure under control which was done by up titrating  some of his blood pressure medication. The patient was feeling much better by the 28th of November and was discharged home in stable  condition.   PHYSICAL EXAMINATION: On the date of discharge: VITAL SIGNS: Were as follows: Temperature 97.5, heart rate 60 per minute, respirations 20 per minute, blood pressure 160/72. He was saturating 96% on 2 liters oxygen via nasal cannula.   CARDIOVASCULAR: S1, S2 normal. No murmurs, rubs or gallops.   LUNGS: Clear to auscultation bilaterally. No wheezing, rales, rhonchi or crepitation.   ABDOMEN: Soft and benign.   NEUROLOGIC: Nonfocal examination. All other physical examination remained at baseline.   DISCHARGE MEDICATIONS:   Medication Instructions  amiodarone 200 mg oral tablet  1 tab(s) orally once a day   atorvastatin 20 mg oral tablet  1 tab(s) orally once a day (at bedtime)   escitalopram 5 mg oral tablet  1 tab(s) orally once a day   hyoscyamine 0.375 mg oral tablet, extended release  1 tab(s) orally every 12 hours   lyrica 75 mg oral capsule  1 cap(s) orally 2 times a day   dexilant 60 mg oral delayed release capsule  1 cap(s) orally once a day   symbicort 80 mcg-4.5 mcg/inh inhalation aerosol  2 puff(s) inhaled 2 times a day   tamsulosin 0.4 mg oral capsule  1 cap(s) orally once a day   humalog 100 units/ml subcutaneous solution  24 unit(s) subcutaneous at breakfast, 22 units at lunch, and 14 units at supper   lantus 100 units/ml subcutaneous solution  26 unit(s) subcutaneous once a day in the morning   lantus 100 units/ml subcutaneous solution  35 unit(s) subcutaneous once a day (at bedtime)   carvedilol 6.25 mg oral tablet  1 tab(s) orally 2 times a day   spiriva respimat 2.5 mcg/inh inhalation aerosol  2 puff(s) inhaled once a  day   albuterol cfc free 90 mcg/inh inhalation aerosol  2 puff(s) inhaled 4 times a day, As Needed - for Shortness of Breath   clonidine 0.3 mg oral tablet  1 tab(s) orally 2 times a day   hydralazine 50 mg oral tablet  1 tab(s) orally 4 times a day   polyethylene glycol 3350 oral powder for reconstitution  17 gram(s) orally once a day, As  Needed, constipation , As needed, constipation   docusate-senna 50 mg-8.6 mg oral tablet  1 tab(s) orally 3 times a day   amoxicillin-clavulanate 875 mg-125 mg oral tablet  1 tab(s) orally every 12 hours x 7 days   prednisone 10 mg oral tablet  Start at 60 mg and taper by 10 mg daily until complete       DISCHARGE DIET: Low sodium, low fat, low cholesterol, 1800 ADA.   DISCHARGE ACTIVITY: As tolerated.   DISCHARGE INSTRUCTIONS AND FOLLOW-UP: The patient was instructed to follow up with his primary care physician, Dr. Dale Durhamharlene Scott, in 1 to 2 weeks. He will need follow-up with Dr. Ned ClinesHerbon Fleming from pulmonary in 2 to 4 weeks and with Dr. Kandice HamsJohn Bruce from bariatric surgery in 4 to 6 weeks. He remains at very high risk for readmission.   TOTAL TIME DISCHARGING THIS PATIENT: 55 minutes.    ____________________________  vss:TT D: 06/17/2014 15:37:32 ET T: 06/17/2014 16:07:33 ET JOB#: [id number]  cc: Irania Durell S. Sherryll BurgerShah, MD, <Dictator> Dale Durhamharlene Scott, MD Herbon E. Meredeth IdeFleming, MD Primus BravoJon Bruce, MD Dory LarsenKurian D. Kasa, MD Pauletta BrownsYuriy Zeylikman, MD Ellamae SiaVIPUL S [facility] MD ELECTRONICALLY SIGNED 06/17/2014 19:12

## 2014-11-11 NOTE — Consult Note (Signed)
PATIENT NAME:  [name], [name] MR#:  [id number] DATE OF BIRTH:  1934/11/05  DATE OF CONSULTATION:  03/25/2014  REFERRING PHYSICIAN:  Gladstone Lighter, MD  CONSULTING PHYSICIAN:  Grainger Mccarley Lilian Kapur, MD  REASON FOR CONSULTATION: Chronic kidney disease stage III.   HISTORY OF PRESENT ILLNESS: The patient is a pleasant 80 year old Caucasian male with past medical history of COPD, chronic atrial fibrillation, diabetes mellitus, hypertension, obstructive sleep apnea, diastolic heart failure, anemia of chronic kidney disease, chronic kidney disease stage III, who presented to [facility] with increasing shortness of breath upon exertion, as well as increasing lower extremity edema. He has been sitting up to sleep for quite some time. He has increasing lower extremity edema as well as weight. He also has dyspnea with exertion. He has been following with Dr. Rockey Situ for underlying congestive heart failure. He is on torsemide 40 mg p.o. b.i.d. at home. He reports adherence with this, however, this is currently in question. We are asked to see him for evaluation and management of chronic kidney disease stage III. The patient's baseline creatinine appears to be 2.15 from June 2015. He has not seen a nephrologist, though he was previously referred to our office but never made his appointment. Creatinine currently is 2.07. Mild hypernatremia is also noted with a serum sodium of 147. The patient also appears to be anemic which could be related to his kidney disease, as MCV was noted as being 93. Apparently, he has been referred to a hematologist as well.   PAST MEDICAL HISTORY: 1.  COPD.  2.  Chronic atrial fibrillation.  3.  Diabetes mellitus.  4.  Hypertension.  5.  Obstructive sleep apnea.  6.  Diastolic congestive heart failure.  7.  Chronic kidney disease stage III with baseline creatinine 2.1.  8.  Anemia of chronic kidney disease.   PAST SURGICAL HISTORY:  1.  Right knee surgery.   2.  Bilateral shoulder arthroscopy.  3.  Lower back surgery.   ALLERGIES: OXYCODONE.   CURRENT INPATIENT MEDICATIONS INCLUDE:  1.  Tylenol 650 mg p.o. every four hours p.r.n.  2.  Amiodarone 200 mg p.o. daily.  3.  Lipitor 20 mg p.o. daily.  4.  Symbicort 80/4.5 two puffs inhaled b.i.d.  5.  Clonidine 0.1 mg p.o. b.i.d.  6.  Colace 100 mg p.o. b.i.d.  7.  Lexapro 5 mg p.o. daily.  8.  Lasix 80 mg IV b.i.d.  9.  Heparin 5000 units subcutaneous q. 8 hours.  10.   Levbid 0.375 mg p.o. b.i.d.  11.   Aspart insulin 24 units at breakfast, 22 units at lunch and 14 units at supper.  12.   Levemir 30 mg subcutaneous q. 12 hours.  13.   Sliding scale insulin.  14.   Multivitamin 1 tablet p.o. daily.  15.   Zofran 4 mg IV q. 4 hours p.r.n.  16.   Protonix 40 mg p.o. every 6:00 a.m.  17.   Potassium chloride 20 mEq p.o. daily.  18.   Lyrica 75 mg p.o. b.i.d.  19.   Senna 1 tablet p.o. b.i.d.  20.   Januvia 100 mg p.o. daily.  21.   Flomax 0.4 mg p.o. daily.  22.   Coreg 12.5 mg p.o. b.i.d.  23.   Zanaflex 2 mg p.o. daily p.r.n. muscle spasm.   SOCIAL HISTORY: The patient resides close to [facility]. He is married. He has a remote history of tobacco use, but denies alcohol or illicit  drug use.   FAMILY HISTORY: The patient's mother had dementia and the patient's father had a history of congestive heart failure.   REVIEW OF SYSTEMS: CONSTITUTIONAL: The patient denies fevers, chills. Endorses weight gain.  EYES: Denies diplopia, blurry vision.  HEENT: Reports headache but denies hearing loss.  Denies epistaxis, sore throat.  CARDIOVASCULAR: Denies chest pain, but does have some palpitations. Does have history of atrial fibrillation and dyspnea with exertion.  RESPIRATORY: Endorses shortness of breath as well as cough, but denies hemoptysis.  GASTROINTESTINAL: Denies nausea, vomiting, or bloody stools.  GENITOURINARY: Denies frequency, urgency. Does have some periodic or dysuria.   MUSCULOSKELETAL: Denies lower extremity pain. Reports pain in his trapezius area.  INTEGUMENTARY: Denies skin rashes or lesions.  NEUROLOGIC: Denies focal weakness. Does have lower extremity numbness.  PSYCHIATRIC: Denies depression, bipolar disorder.  ENDOCRINE: Denies polyuria or polydipsia.  HEMOLYMPHATIC AND LYMPHATIC: Has history of anemia, which is likely due to his kidney disease.  ALLERGY AND IMMUNOLOGIC: Denies seasonal allergies or history.   PHYSICAL EXAMINATION: VITAL SIGNS: Temperature 97.7, pulse 90, respirations 20, blood pressure 172/75, pulse oximetry 93% on 2 liters.  GENERAL: Reveals an obese Caucasian male who appears his stated age, currently in no acute distress.  HEENT: Normocephalic, atraumatic. Extraocular movements are intact. Pupils equal, round, and reactive to light. No scleral icterus. Conjunctivae are pale. No epistaxis noted. Gross hearing intact. Oral mucosa moist.  NECK: Supple. Difficult to assess for JVD given habitus.  LUNGS: Demonstrate bibasilar rales with normal respiratory effort.  HEART: S1, S2. Periods of irregularity noted.  ABDOMEN: Obese, soft, nontender, nondistended. Bowel sounds positive. No rebound or guarding or organomegaly appreciated.  EXTREMITIES: No clubbing or cyanosis noted, 2+ pitting edema noted in bilateral lower extremities.  NEUROLOGICAL: The patient is awake, alert, and oriented to time, person, and place. Strength is 5/5 in both upper and lower extremities.  SKIN: Warm and dry. No rashes noted.  GENITOURINARY: No suprapubic tenderness is noted at this time.  PSYCHIATRIC: The patient with appropriate affect and appears to have good insight into his current illness.   LABORATORY DATA: Sodium 147, potassium 3.8, chloride 107, CO2 of 33, BUN 27, creatinine 2.07, glucose 116, EGFR of 30, calcium of 8.4, total protein 6.4, albumin 3.0, total bilirubin 0.5, alkaline phosphatase 111, AST 19, ALT 16. CK 214, troponin less than 0.02. CBC  shows WBC 7.7, hemoglobin 8.9, hematocrit 27, platelets 99,000.   IMPRESSION: This is a 80 year old Caucasian male with past medical history of chronic obstructive pulmonary disease, chronic atrial fibrillation, diabetes mellitus, hypertension, obstructive sleep apnea on CPAP, diastolic heart failure, chronic kidney disease stage III, anemia of chronic kidney disease who presented to [facility] with acute diastolic heart failure.  1.  Chronic kidney disease, stage III. The patient has a history of long-standing diabetes mellitus as well as hypertension, which could potentially lead to underlying chronic kidney disease. We have reviewed his lab trends and it appears he does have chronic kidney disease stage III. He was previously referred to our practice but never came for evaluation. We will proceed now with SPEP, UPEP, ANA, urinalysis, urine protein to creatinine ratio a renal, and renal ultrasound. We will need to watch creatinine trend closely while on aggressive diuretic therapy. He will also certainly need long-term renal followup once discharged.  2.  Acute diastolic heart failure. The patient certainly appears to have symptoms of diastolic heart failure now. He has had significant weight gain and has lower extremity edema.  Agree with Lasix 80 mg IV b.i.d., but as above will need to watch creatinine trend closely with aggressive diuresis.  3.  Hypernatremia. Serum sodium noted to be 147. Will need to watch this trend as well,  and may need to consider cutting back the diuretic therapy if sodium continues to rise.  4.  Anemia of chronic kidney disease. Hemoglobin noted as being 8.9 with an MCV of 93. We will proceed with SPEP, UPEP, and serum iron studies.   I would like to thank Dr. Tressia Miners for this kind referral. Further plan as the patient progresses.    ____________________________ Tama High, MD mnl:at D: 03/25/2014 16:23:56 ET T: 03/25/2014 17:25:45  ET JOB#: [id number]  cc: Tama High, MD, <Dictator> Mariah Milling Gaither Biehn MD ELECTRONICALLY SIGNED 04/06/2014 16:01

## 2014-11-11 NOTE — H&P (Signed)
PATIENT NAME:  [name], [name] MR#:  [id number] DATE OF BIRTH:  February 16, 1935  DATE OF ADMISSION:  03/24/2014  ADMITTING PHYSICIAN: Dr. Enid Baas.  PRIMARY CARE PHYSICIAN: Dr. Dale Robins.   PRIMARY CARDIOLOGIST: Dr. Julien Nordmann.   CHIEF COMPLAINT: Worsening dyspnea on exertion.   HISTORY OF PRESENT ILLNESS: [name] is a 80 year old, obese, Caucasian male with past medical history significant for diastolic CHF, diabetes mellitus, CKD, history of atrial fibrillation, not on any anticoagulation after the cardioversion, history of sleep apnea on CPAP, was sent in from Dr. Windell Hummingbird office today for worsening weight gain, dyspnea on exertion, and likely CHF exacerbation.   The patient states he has been noticing that his abdomen is getting more distended and swollen and also he is retaining a lot of fluid in his legs. He does not eat salt, but he does not follow fluid restriction. He drinks about 3 liters of water every day. 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 Mariah Milling in the office who saw him in the office today. Due to his worsening dyspnea on exertion, weight gain he needed IV diuresis at this time, so was sent over to the hospital. The patient denies any chest pain but he feels like he has some chest tightness occasionally when he walks. No fevers, chills, or cough at this time.   PAST MEDICAL HISTORY:  1. COPD. 2. Chronic atrial fibrillation, status post cardioversion, not on anticoagulation.  3. Diabetes mellitus.  4. Hypertension.  5. Obstructive sleep apnea on CPAP.  6. Diastolic CHF.  7. CKD stage IV.  8. Anemia of chronic disease.   PAST SURGICAL HISTORY:  1. Right knee surgery.  2. Bilateral shoulder arthroscopy procedures.  3. Lower back surgery.   ALLERGIES TO MEDICATIONS: OXYCODONE.   CURRENT HOME MEDICATIONS:  1. Amiodarone 200 mg p.o. daily.  2. Atenolol 50 mg p.o. daily.  3. Atorvastatin 20  mg p.o. daily.  4. Symbicort 80/4.5 two puffs b.i.d.  5. Clonidine 0.1 mg p.o. b.i.d.  6. Lexapro 5 mg p.o. daily.  7. Nexium 40 mg p.o. before breakfast.  8. Humalog 24 units at breakfast, next 22 units at lunch, and 14 units at supper.  9. Hyoscyamine 0.375 mg p.o. b.i.d.  10. Januvia 100 mg p.o. daily.  11. Lantus 26 units in the a.m. and 35 units in the p.m.  12. Levalbuterol inhaler every 4 hours as needed for wheezing.  13. Multivitamin 1 tablet p.o. daily.  14. Potassium chloride 10 mEq in the morning and 10 mEq at bedtime.  15. Pregabalin 75 mg p.o. b.i.d.  16. Flomax 0.4 mg p.o. daily.  17. Torsemide 40 mg p.o. b.i.d.   SOCIAL HISTORY: He lives at home, ambulates with the help of a cane at baseline. Next, denies any smoking or alcohol at this time.  FAMILY HISTORY: Significant for heart failure in father and also grandfather. Mom with dementia.   REVIEW OF SYSTEMS: CONSTITUTIONAL: No fever, fatigue, or weakness.  EYES: No blurred vision, double vision, inflammation, or glaucoma. ENT: No tinnitus, ear pain, hearing loss, epistaxis, or discharge.  RESPIRATORY: Positive for chronic dyspnea on exertion, no cough. Positive for COPD and sleep apnea.  CARDIOVASCULAR: No chest pain at this time. No orthopnea or edema. Positive for dyspnea on exertion. No palpitations or syncope.  GASTROINTESTINAL: No nausea, vomiting, diarrhea, abdominal pain. Positive for chronic constipation.  GENITOURINARY: No dysuria, hematuria, renal calculus, frequency,  or incontinence. ENDOCRINE: No polyuria, nocturia, thyroid problems, heat, or cold intolerance.  HEMATOLOGY: No anemia, easy bruising or bleeding.  SKIN: No acne, rash, or lesions.  MUSCULOSKELETAL: Has arthritis and chronic back issues and shoulder pain. NEUROLOGIC: No numbness, weakness, CVA, TIA, or seizures.  PSYCHOLOGICAL: No anxiety, insomnia, or depression.  PHYSICAL EXAMINATION:  VITAL SIGNS: Temperature 98.2 degrees Fahrenheit, pulse  56, respirations 20, blood pressure 171/61, pulse oximetry 95% on 2 liters.  GENERAL: Heavily built, well-nourished male sitting in bed, not in any acute distress.  HEENT: Normocephalic, atraumatic. Pupils equal, round, reacting to light. Anicteric sclerae. Extraocular movements intact. Oropharynx is clear without any erythema, mass, or exudates. NECK: Supple. No thyromegaly, JVD, or carotid bruits. No lymphadenopathy. Normal range of motion without pain.  LUNGS: Moving air bilaterally. Bibasilar crackles heard. No wheeze or rhonchi. Minimal use of accessory muscles even with minimal exertion. Unable to complete his sentences without having to pause to take a deep breath.  CARDIOVASCULAR: S1, S2. Regular rate and rhythm. No murmurs, rubs, or gallops.  ABDOMEN: Obese with fluid retained. Nontender though. Bowel sounds are normal.    EXTREMITIES: Has 3+ edema up to the knees. Unable to palpate good dorsalis pedal pulses due to the edema. No clubbing or cyanosis. SKIN: No acne, rash, or lesions.  LYMPHATIC: No cervical lymphadenopathy.  NEUROLOGIC: Cranial nerves II through XII grossly intact. No focal motor or sensory deficits.  PSYCHOLOGICAL: The patient is awake, alert, oriented x 3.  LABORATORY DATA: WBC 7.9, hemoglobin 8.9, hematocrit 27.3, platelet count is 111,000.   Sodium 145, potassium 3.9, chloride 108, bicarbonate 33, BUN 23, creatinine 2.06, glucose 61, and calcium of 8.3.   ALT 60, AST 90, alkaline phosphatase 111, total bilirubin is 0.5, albumin of 3.0. BNP is elevated at 1388, CK 188, CK-MB 3.3, troponin less than 0.02.   EKG is pending at this time and also a chest x-ray is pending at this time.   ASSESSMENT AND PLAN: A 80 year old male with chronic obstructive pulmonary disease, sleep apnea, and diastolic congestive heart failure, hypertension, diabetes, who was sent in from cardiology office due to congestive heart failure exacerbation.  1. Acute on chronic diastolic congestive  heart failure exacerbation. Already on torsemide b.i.d. at home. We will change it to Lasix 80 mg IV b.i.d. here along with some potassium supplements and will also get cardiology consult and echocardiogram for him. Continue oxygen support as needed.  2. Chronic atrial fibrillation. He had cardioversion done. He could not tolerate Xarelto in the past. Currently for some reason, he is not on any anticoagulation. Continue amiodarone and monitor on telemetry. Rate, we will control using atenolol.  3. Obstructive sleep apnea on CPAP. Also has history of chronic obstructive pulmonary disease, appears stable on inhalers, which will be continued at this time.  4. Diabetes mellitus. Continue his insulin, aspart, insulin sliding scale, and also on Lantus here. 5. Chronic kidney disease. Now that he is being diuresed, we will get nephrology consult.  6. Hypertension. Continue atenolol, clonidine at this time.  7. Benign prostatic hypertrophy on Flomax.  8. Deep vein thrombosis prophylaxis. We will place him on subcutaneous heparin.   CODE STATUS OF THE PATIENT: Full code.   TIME SPENT ON ADMISSION: 50 minutes.     ____________________________ Enid Baasadhika Eural Holzschuh, MD rk:lt D: 03/24/2014 20:27:12 ET T: 03/24/2014 22:07:01 ET JOB#: [id number]  cc: Enid Baasadhika Jerret Mcbane, MD, <Dictator> Antonieta Ibaimothy J. Gollan, MD Dale Durhamharlene Scott, MD Enid BaasADHIKA Blinda Turek MD ELECTRONICALLY SIGNED 03/27/2014 17:31

## 2014-11-11 NOTE — Consult Note (Signed)
General Aspect 80 y/o male with a h/o diastolic CHF & afib, and w/o prior h/o CAD, who presented to the ED last night with chest pain. __________  Past Medical History 1. PAF      a. 10/2012 s/p DCCV.      b. On amiodarone.      c. Has not taken Xarelto since spring of 2014 - thought it caused swelling. 2.  Chronic Diastolic CHF      a. 09/5595 Echo: EF 55-60%, mod conc LVH. Mild bi-atrial enlargement, ild to mod aortic sclerosis /o stenosis. 3. Morbid Obesity 4. HTN 5. HL 6. DM 2 7. COPD (smoked in th 80's) 8. Thrombocytopenia 9. Depression 10. OSA - on CPAP 11. Anemia 12. CKD III-IV 13. Cataracts on L s/p surgical correction 14. Gastritis by EGD 01/2013 _______________   Present Illness . 80 y/o male with the above complex problem list.  He does not have a prior h/o CAD.  He was last admitted to [facility] in 06/2013 with acute on chronic diast CHF.  EF was nl.  He was diuresed and subsequently discharged in good condition.  According to D/C summary, he was sent home on Xarelto 15 mg daily in the setting of prior h/o afib, however pt says he hasn't taken it since last spring following his DCCV.    At baseline, he has chronic DOE after walking about 150 yds.  He does weigh himself daily and says that his weight is generally around 300 lbs.  He sleeps comfortably on 1 pillow @ night.  He has chronic abd bloating and lower ext edema, which he says has been stable. He has not previously experienced exertional chest pain.  He was last seen in clinic 6/18 with c/o dyspnea and LEE.  He was maintained on his prior home dose of torsemide and advised to keep legs elevated and wear compression hose.  On 6/29, he began to experience persistent left chest and shoulder discomfort w/o associated Ss.  He says that it started in his chest, at rest, and then moved to his left shoulder.  This lasted all day and did not significantly limit his activity.  By 6/30, the discomfort moved to his right shoulder and  was worse with rotation of his right shoulder/arm.  He called his PCP and spoke to the covering provider, who recommended that he present to the ED.  There, trop was neg.  ECG showed RBBB (old - first noted 07/2013 on office ECG).  H/H 9.6/28.1 chronic anemia), TSH 5.33, Creat 2.08 (prev 1.7-2.0).  He continues to c/o right shoulder pain as above.  We have been asked to eval.   Physical Exam:  GEN well developed, no acute distress, pleasant, nad.   HEENT pink conjunctivae, moist oral mucosa   NECK supple  obese - difficult to assess jvp.  no bruits.   RESP normal resp effort  diminshed breath sounds bilat.   CARD Regular rate and rhythm  No murmur   ABD denies tenderness  soft  normal BS  protuberant.   EXTR negative cyanosis/clubbing, 1-2 + bilat LEE to mid calf.   SKIN normal to palpation   NEURO grossly intact/nonfocal.   PSYCH alert, A+O to time, place, person   Review of Systems:  Subjective/Chief Complaint muscle pain in the chest   General: has been weak since spring of 2014.   Skin: No Complaints   ENT: No Complaints   Eyes: No Complaints   Neck: No Complaints  Respiratory: chronic doe.   Cardiovascular: Chest pain or discomfort  Dyspnea  Edema   Gastrointestinal: No Complaints   Genitourinary: No Complaints   Vascular: No Complaints   Musculoskeletal: right shoulder pain - worse with rotation.   Neurologic: No Complaints   Hematologic: No Complaints   Endocrine: No Complaints   Psychiatric: No Complaints   Review of Systems: All other systems were reviewed and found to be negative   Medications/Allergies Reviewed Medications/Allergies reviewed   Family & Social History:  Family and Social History:  Family History father died w/ chf, mother died w/ demential. Sister had an MI.   Social History prev smoked 1ppd in the 80's - hasn't smoked since.  No etoh/drugs.   Place of Living Home  lives locally with his wife.  he does not routinely  exercise.          Admit Diagnosis:   CHEST PAIN: Onset Date: 18-Jan-2014, Status: Active, Description: CHEST PAIN  Home Medications: Medication Instructions Status  amiodarone 200 mg oral tablet 1 tab(s) orally once a day Active  atenolol 50 mg oral tablet 1 tab(s) orally 2 times a day Active  atorvastatin 20 mg oral tablet 1 tab(s) orally once a day (at bedtime) Active  cloNIDine 0.1 mg oral tablet 1 tab(s) orally 2 times a day Active  escitalopram 5 mg oral tablet 1 tab(s) orally once a day Active  hyoscyamine 0.375 mg oral tablet, extended release 1 tab(s) orally every 12 hours Active  potassium chloride 20 mEq oral tablet, extended release 1 tab(s) orally once a day Active  Januvia 100 mg oral tablet 1 tab(s) orally once a day Active  Lyrica 75 mg oral capsule 1 cap(s) orally 2 times a day Active  Dexilant 60 mg oral delayed release capsule 1 cap(s) orally once a day Active  oxyCODONE 5 mg oral tablet 1 tab(s) orally 2 times a day, As Needed Active  tiZANidine 4 mg oral capsule 0.5-1 cap(s) orally once a day (at bedtime) as neeed Active  torsemide 20 mg oral tablet 2 tab(s) orally in the morning and 1 tab in the evening  Active  Symbicort 80 mcg-4.5 mcg/inh inhalation aerosol 2 puff(s) inhaled 2 times a day Active  tamsulosin 0.4 mg oral capsule 1 cap(s) orally once a day Active  HumaLOG 100 units/mL subcutaneous solution 24 unit(s) subcutaneous at breakfast, 22 units at lunch, and 14 units at supper Active  Lantus 100 units/mL subcutaneous solution 26 unit(s) subcutaneous once a day in the morning Active  Lantus 100 units/mL subcutaneous solution 35 unit(s) subcutaneous once a day (at bedtime) Active   Lab Results:  Thyroid:  01-Jul-15 04:52   Thyroid Stimulating Hormone  5.33 (0.45-4.50 (International Unit)  ----------------------- Pregnant patients have  different reference  ranges for TSH:  - - - - - - - - - -  Pregnant, first trimetser:  0.36 - 2.50 uIU/mL)  Routine  Chem:  01-Jul-15 04:52   Result Comment DIFFERENTIAL - RESULTS VERIFIED BY REPEAT TESTING.  - SMEAR SCANNED  Result(s) reported on 18 Jan 2014 at 07:07AM.  Glucose, Serum  122  BUN  25  Creatinine (comp)  2.08  Sodium, Serum 142  Potassium, Serum  3.4  Chloride, Serum 102  CO2, Serum  34  Calcium (Total), Serum  8.4  Anion Gap  6  Osmolality (calc) 289  eGFR (African American)  35  eGFR (Non-African American)  30 (eGFR values <59mL/min/1.73 m2 may be an indication of chronic kidney disease (CKD).  Calculated eGFR is useful in patients with stable renal function. The eGFR calculation will not be reliable in acutely ill patients when serum creatinine is changing rapidly. It is not useful in  patients on dialysis. The eGFR calculation may not be applicable to patients at the low and high extremes of body sizes, pregnant women, and vegetarians.)  Magnesium, Serum  1.6 (1.8-2.4 THERAPEUTIC RANGE: 4-7 mg/dL TOXIC: > 10 mg/dL  -----------------------)  Cholesterol, Serum 148  Triglycerides, Serum 127  HDL (INHOUSE)  37  VLDL Cholesterol Calculated 25  LDL Cholesterol Calculated 86 (Result(s) reported on 18 Jan 2014 at 05:56AM.)  Cardiac:  30-Jun-15 11:09   CPK-MB, Serum 2.6 (Result(s) reported on 18 Jan 2014 at 01:40AM.)  Troponin I < 0.02 (0.00-0.05 0.05 ng/mL or less: NEGATIVE  Repeat testing in 3-6 hrs  if clinically indicated. >0.05 ng/mL: POTENTIAL  MYOCARDIAL INJURY. Repeat  testing in 3-6 hrs if  clinically indicated. NOTE: An increase or decrease  of 30% or more on serial  testing suggests a  clinically important change)    16:34   CK, Total 179 (39-308 NOTE: NEW REFERENCE RANGE  08/22/2013)  CPK-MB, Serum 3.0 (Result(s) reported on 17 Jan 2014 at 06:01PM.)  CPK-MB, Serum -    16:45   Troponin I < 0.02 (0.00-0.05 0.05 ng/mL or less: NEGATIVE  Repeat testing in 3-6 hrs  if clinically indicated. >0.05 ng/mL: POTENTIAL  MYOCARDIAL INJURY. Repeat  testing in  3-6 hrs if  clinically indicated. NOTE: An increase or decrease  of 30% or more on serial  testing suggests a  clinically important change)    22:31   CK, Total 206 (39-308 NOTE: NEW REFERENCE RANGE  08/22/2013)  CPK-MB, Serum 3.5 (Result(s) reported on 18 Jan 2014 at 01:32AM.)  Troponin I < 0.02 (0.00-0.05 0.05 ng/mL or less: NEGATIVE  Repeat testing in 3-6 hrs  if clinically indicated. >0.05 ng/mL: POTENTIAL  MYOCARDIAL INJURY. Repeat  testing in 3-6 hrs if  clinically indicated. NOTE: An increase or decrease  of 30% or more on serial  testing suggests a  clinically important change)  Routine Hem:  01-Jul-15 04:52   WBC (CBC) 6.7  RBC (CBC)  3.04  Hemoglobin (CBC)  9.6  Hematocrit (CBC)  28.1  Platelet Count (CBC)  101  MCV 93  MCH 31.6  MCHC 34.2  RDW  15.0  Neutrophil % 56.7  Lymphocyte % 9.3  Monocyte % 32.9  Eosinophil % 0.8  Basophil % 0.3  Neutrophil # 3.8  Lymphocyte #  0.6  Monocyte #  2.2  Eosinophil # 0.1  Basophil # 0.0   EKG:  EKG Interp. by me   Interpretation EKG shows sinus brady, 52, rbbb, twi III (nuc med ECG)   Radiology Results: XRay:    30-Jun-15 12:36, Chest PA and Lateral  Chest PA and Lateral   REASON FOR EXAM:    sob  COMMENTS:   LMP: (Male)    PROCEDURE: DXR - DXR CHEST PA (OR AP) AND LATERAL  - Jan 17 2014 12:36PM     CLINICAL DATA:  Shortness of breath    EXAM:  CHEST  2 VIEW    COMPARISON:  06/25/2013    FINDINGS:  Cardiomegaly pulmonary vascular congestion and possible mild  interstitial edema. Mild bibasilar opacities, likely atelectasis. No  pleural effusion or pneumothorax.    Mild degenerative changes of the visualized thoracolumbar spine.     IMPRESSION:  Cardiomegaly with pulmonary vascular congestion and possible mild  interstitial  edema.    Mild bibasilar opacities, likely atelectasis.      Electronically Signed    By: Julian Hy M.D.    On: 01/17/2014 12:46     Verified By: Julian Hy, M.D.,    Oxycodone: Itching  Vital Signs/Nurse's Notes: **Vital Signs.:   01-Jul-15 05:01  Vital Signs Type Routine  Temperature Temperature (F) 97.8  Celsius 36.5  Temperature Source oral  Pulse Pulse 52  Respirations Respirations 20  Systolic BP Systolic BP 948  Diastolic BP (mmHg) Diastolic BP (mmHg) 73  Mean BP 107  Pulse Ox % Pulse Ox % 90  Pulse Ox Activity Level  At rest  Oxygen Delivery Room Air/ 21 %  *Intake and Output.:   Daily 01-Jul-15 07:00  Grand Totals Intake:   Output:  400    Net:  -400 24 Hr.:  -400  Urine ml     Out:  400  Length of Stay Totals Intake:   Output:  400    Net:  -400    Impression 1.  Midsternal/left chest, shoulder, and right shoulder pain:   atypical chest pain Pt presented with a 1 day h/o persistent left chest, shoulder, and right shoulder discomfort w/o assoc Ss.  The right shoulder discomfort was worse with rotation of the right shoulder/arm.  Despite prolonged Ss, troponins have been wnl.  ECG shows RBBB w/o acute st/t changes.  RBBB has been seen on office ECGs dating back to earlier this year.  Suspect that Ss are primarily MSK in nature however, given risk factors for CAD, it is not unreasonable to pursue a  lexiscan myoview to r/o ischemia. ----stress myoview is low risk, no significant ischemia (rest images not needed) ok to d/c home --needs flexeril and vicodin for pain, avoid NSAIDS  2.  Chronic Diastolic CHF:   Wt has been stable @ home.  Suspect he lives with a moderate amount of volume overload at all times.  His BP is elevated this AM and he is on multiple BP meds @ home - just received these.  He does have significant lower ext edema and abd bloating, though this is his baseline.  He is on torsemide @ home and there has been reluctance in the past to escalate his diuretic dosing further 2/2 CKD III-IV.   ---Since he is hospitalized and we will be able to f/u creat acutely, we can give some IV lasix today to get  some volume off.  --- Cont home meds and follow BPs with an eye toward titrating clonidine if pressures continue to run high.  3.  HTN:   BP elev as above.  Cont home meds and follow.  4.  HL:   Cont statin.  5.  PAF:   In sinus.  Cont Amio.  CHA2DS2VASc = 4.  Ideally should be on anticoagulation.  Was prev on Xarelto however he d/c'd b/c he thought it was causing swelling, however than he realized that that could have been coming from lyrica.  Most likely it's 2/2 #2.  Consider resuming xarelto @ $RemoveB'15mg'ZbMnGzoT$  daily, keeping in mind that he has a h/o gastritis by EGD in 01/2013 along with chronic anemia/thrombocytopenia.  6.  OSA:   Uses CPAP @ home.  7.  DM:   Per IM.  8.  Hypokalemia:   Supp.   Electronic Signatures: Rogelia Mire (NP)  (Signed 01-Jul-15 10:11)  Authored: General Aspect/Present Illness, History and Physical Exam, Review of System, Family & Social History, Home  Medications, Labs, EKG , Radiology, Allergies, Vital Signs/Nurse's Notes, Impression/Plan Ida Rogue (MD)  (Signed 01-Jul-15 14:00)  Authored: General Aspect/Present Illness, History and Physical Exam, Review of System, Health Issues, EKG , Impression/Plan  Co-Signer: General Aspect/Present Illness, Home Medications, Allergies   Last Updated: 01-Jul-15 14:00 by Ida Rogue (MD)

## 2014-11-11 NOTE — Discharge Summary (Signed)
PATIENT NAME:  [name], [name] MR#:  [id number] DATE OF BIRTH:  Jun 15, 1935  DATE OF ADMISSION:  01/17/2014 DATE OF DISCHARGE:  01/18/2014  PRIMARY CARE PHYSICIAN:  Dr. Dale Arvin.   FINAL DIAGNOSES:  1. Atypical chest pain, which was actually musculoskeletal shoulder pain.  2. Chronic kidney disease.  3. Hypertension.  4. Sleep apnea.  5. Hypokalemia, hypomagnesemia.  6. Anemia.  7. History of congestive heart failure.  8. Lower extremity edema. No signs on this hospital stay.   MEDICATIONS ON DISCHARGE: Include amiodarone 200 mg daily, atenolol 50 mg twice a day, atorvastatin 20 mg at bedtime, clonidine 0.1 mg twice a day, Lexapro 5 mg daily, hyoscyamine 0.375 mg every 12 hours, potassium chloride 20 mEq extended-release daily, Januvia 100 mg daily, Lyrica 75 mg twice a day, Dexilant 60 mg daily, oxycodone 5 mg 1 tablet twice a day as needed, torsemide 20 mg 2 tablets in the morning and 1 tablet in the evening, Symbicort 84.5 two puffs twice a day, Flomax 0.4 mg daily. Humalog 24 units at breakfast, 22 units at lunch, 14 units at supper.  Lantus 35 units at bedtime, 26 units in the morning.  Tizanidine 4 mg half tablet at bedtime as needed for muscle spasm. Prednisone 5 mg 3 tablets day one, 2 tablets day two, 1 tablet day three, 1/2 tablet days four and five.   DIET:  Low sodium, carbohydrate -controlled diet, regular consistency.   ACTIVITY: As tolerated.   FOLLOWUP:  With Dr. Mariah Milling, cardiology, in 1-2 weeks with Dr. Dale Ansted. The patient was admitted as an observation on 01/17/2014. The patient complained of pain in the left shoulder up into his neck and into his chest.  Then that disappeared and it went into his right shoulder, up into his neck and to his back and to his chest. The patient was brought in as an observation and a stress test was ordered.   LABORATORY AND RADIOLOGICAL DATA DURING THE HOSPITAL COURSE: Included a CPK of 2.6. BNP 548. Glucose 186, BUN 21, creatinine  2.15, sodium 136, potassium 4.0, chloride 102, CO2 of 28. White blood cell count 6.7, H and H 9.4 and 28.3, platelet count of 100. Troponin negative.   EKG: Sinus bradycardia, first degree AV block, right bundle branch block.  Chest x-ray: Cardiomegaly, pulmonary vascular congestion.   Troponin negative x 2 more times. TSH 5.33. LDL 86, HDL 37, triglycerides 125. Magnesium 1.6. Creatinine upon discharge 2.05, potassium 3.4, hemoglobin 9.6.   Stress test as reported by Dr. Mariah Milling was negative.   HOSPITAL COURSE PER PROBLEM LIST:  1. For this atypical chest pain, this is clearly musculoskeletal in nature. Complaining of shoulder pain. The patient actually called the medical doctor's office who referred him in to the ER. ER physician referred him in for admission. Stress test negative. Cardiac enzymes negative. Patient ran out of his muscle relaxant. I did give him a quick prednisone taper unable to give other anti-inflammatories secondary to chronic kidney disease.  2. Chronic kidney disease. GFR is 30, making this chronic kidney disease stage IV.  3. Hypertension. Blood pressure a little borderline. If he is going to take the tizanidine muscle relaxant that can really cause hypotension. He must watch his blood pressure.  4. Sleep apnea. Weight loss needed. The patient does have obesity with a BMI of 39.7. Weight loss is needed.  5. Hypokalemia and hypomagnesemia. This was replaced.  6. Anemia. This can be followed up as outpatient.  7. History of  congestive heart failure and edema. No signs on this hospital stay. Respiratory status is stable. Breathing comfortably on room air. Lungs were clear upon discharge.  TIME SPENT ON DISCHARGE: 35 minutes.    ____________________________ Herschell Dimesichard J. Renae GlossWieting, MD rjw:[id number] D: 01/18/2014 16:15:09 ET T: 01/19/2014 03:57:16 ET JOB#: [id number]  cc: Herschell Dimesichard J. Renae GlossWieting, MD, <Dictator> Dale Durhamharlene Scott, MD  Salley ScarletICHARD J Gailen Venne MD ELECTRONICALLY SIGNED 01/19/2014  16:21

## 2014-11-19 NOTE — Consult Note (Signed)
General Aspect 80 year old male with past history of morbid obesity, chronic diastolic CHF, hypertension, hypercholesterolemia, diabetes with renal insufficiency, thrombocytopenia patient of Dr. Nicki Reaper,?? episode of atrial fibrillation in early 2014,?? COPD, obstructive sleep apnea, on CPAP, chronic diastolic CHF,?? smoked for 25 years, chronic leg edema,  elective cardioversion on October 27 2012, presenting with worsening SOB,  acute on chronic diastolic CHF, resolving bronchitis.  His dyspnea has been getting worse over the past week. He went to urgent care, started on an antibiotic for 5 days for possible bronchitis. He finished his antibiotic yesterday, went for a routine followup visit with his PCP today and during the exam she felt that he was tachypneic.  PCP referred him to the ER.   In the ER, Chest x-ray showed mild pulmonary edema. Worsening of his chronic leg edema noted. No BM in 5 days. Improving cough, no sputum. On chronic oxygen. Baseline hypoxia on RA with exertion, noted in 2015. Labs showing worsening of his chronic anemia. Baseline HCT 27 to 28, now 22. Started on lasix IV overnight Has not been on amloidpine as an outpt, started by PMD recently in past few days?   Present Illness . Admission to the hospital November 12 with discharge 06/03/2014, admitted with shortness of breath, weakness, flank pain. His creatinine was elevated, greater than 3 consistent with dehydration from overuse of torsemide. He had a cough with shortness of breath and was started on antibiotics for bronchitis. Of note he had lower extremity edema in the hospital despite dehydration He was started on oxygen for significant hypoxia. Saturations into the mid to low 80s on room air, worse with exertion  Baseline weight 295 to 305.  ??in the hospital 06/26/2013 for shortness of breath BNP 4500. He had aggressive diuresis and was -11 L through his hospital course, ?? ejection fraction 55-60%, creatinine 2.1  but went as high as 2.4,  in normal sinus rhythm during his hospital course  ??History of?? severe neck pain. He reports having an episode of neck pain previously that seemed to resolve after an MRI. Then he developed hip pain and was seeking care from an orthopedic specialist.?? He reports being compliant with his CPAP  Echocardiogram?? shows normal ejection fraction with moderately dilated left atrium, high normal right ventricular systolic pressure  Family & Social History:  ?? Family History Coronary Artery Disease  Mother: CAD, HTN, arthritis, DM; father: cancer ?? Social History negative ETOH, negative Illicit drugs ?? + Tobacco Prior (greater than 1 year)  quit 1986 (25 pack years) ?? Place of Living Home   Physical Exam:  GEN well developed, obese   HEENT hearing intact to voice, moist oral mucosa   NECK supple   RESP normal resp effort  postive use of accessory muscles  rhonchi   CARD Regular rate and rhythm  Normal, S1, S2  Murmur   Murmur Systolic   Systolic Murmur Out flow   ABD denies tenderness   LYMPH negative neck   EXTR positive edema, 2+ pitting LE   SKIN normal to palpation   NEURO motor/sensory function intact   PSYCH alert, A+O to time, place, person, good insight   Review of Systems:  Subjective/Chief Complaint SOB, cough   General: Weakness   Skin: No Complaints   ENT: No Complaints   Eyes: No Complaints   Neck: No Complaints   Respiratory: Frequent cough  Short of breath  Wheezing   Cardiovascular: Dyspnea  Edema   Gastrointestinal: No Complaints  Genitourinary: No Complaints   Vascular: No Complaints   Musculoskeletal: No Complaints   Neurologic: No Complaints   Hematologic: No Complaints   Endocrine: No Complaints   Psychiatric: No Complaints   Review of Systems: All other systems were reviewed and found to be negative   Medications/Allergies Reviewed Medications/Allergies reviewed   Family & Social History:   Family and Social History:  Family History Coronary Artery Disease  Mother: CAD, HTN, arthritis, DM; father: cancer   Social History negative ETOH, negative Illicit drugs   + Tobacco Prior (greater than 1 year)  quit 1986 (25 pack years)   Place of Living Home     Multi-drug Resistant Organism (MDRO): Positive culture for MRSA., 16-Jun-2014   COPD:    Atrial Fibrillation:    htn:    Diabetes:    COPD:    Knee Surgery - Right:    cataract surgery:    back surgery L3 L4:   Home Medications: Medication Instructions Status  Lyrica 75 mg oral capsule 1 cap(s) orally 2 times a day Active  carvedilol 6.25 mg oral tablet 1 tab(s) orally 2 times a day Active  Dexilant 60 mg oral delayed release capsule 1 cap(s) orally once a day Active  Symbicort 80 mcg-4.5 mcg/inh inhalation aerosol 2 puff(s) inhaled 2 times a day Active  tamsulosin 0.4 mg oral capsule 1 cap(s) orally once a day Active  hydrALAZINE 50 mg oral tablet 1 tab(s) orally 4 times a day Active  omeprazole 20 mg oral delayed release capsule 1 cap(s) orally once a day Active  metolazone 5 mg oral tablet 1 tab(s) orally once a day, As Needed Active  amiodarone 200 mg oral tablet 2 tab(s) orally once a day (in the morning) and 1 in the evening.  Active  atorvastatin 20 mg oral tablet 1 tab(s) orally once a day Active  cloNIDine 0.1 mg oral tablet 1 tab(s) orally 2 times a day Active  HumaLOG 100 units/mL subcutaneous solution 10 unit(s) subcutaneous once a day (in the morning) and 10 units at noon.  Active  hyoscyamine 0.375 mg oral tablet, extended release 1 tab(s) orally once a day Active  Lantus Solostar Pen 100 units/mL subcutaneous solution 26 unit(s) subcutaneous once a day (in the morning) and 30 units in the evening.  Active  Lexapro 10 mg oral tablet 0.5 tab(s) orally once a day Active  potassium chloride 20 mEq oral tablet, extended release 0.5 tab(s) orally 2 times a day Active  ProAir HFA CFC free 90 mcg/inh  inhalation aerosol 2 puff(s) inhaled 2 times a day Active  Spiriva Respimat 2.5 mcg/inh inhalation aerosol 2 puff(s) inhaled 2 times a day Active  torsemide 20 mg oral tablet 2 tab(s) orally once a day (in the morning) and 1 in the evening. ---Follow scale Active  polyethylene glycol 3350 - oral powder for reconstitution 17 gram(s) orally once a day, As Needed - for Constipation Active   Lab Results:  Routine Chem:  22-Jan-16 04:06   Result Comment DIFFERENTIAL - SMEAR SCANNED  Result(s) reported on 11 Aug 2014 at 06:54AM.  Glucose, Serum  223  BUN  27  Creatinine (comp)  2.20  Sodium, Serum 141  Potassium, Serum 3.8  Chloride, Serum 104  CO2, Serum 31  Calcium (Total), Serum  8.1  Anion Gap  6  Osmolality (calc) 293  eGFR (African American)  38  eGFR (Non-African American)  31 (eGFR values <51m/min/1.73 m2 may be an indication of chronic kidney disease (CKD). Calculated  eGFR, using the MRDR Study equation, is useful in  patients with stable renal function. The eGFR calculation will not be reliable in acutely ill patients when serum creatinine is changing rapidly. It is not useful in patients on dialysis. The eGFR calculation may not be applicable to patients at the low and high extremes of body sizes, pregnant women, and vegetarians.)  Routine Hem:  22-Jan-16 04:06   WBC (CBC) 7.6  RBC (CBC)  2.55  Hemoglobin (CBC)  7.5  Hematocrit (CBC)  22.8  Platelet Count (CBC)  100  MCV 90  MCH 29.5  MCHC 32.9  RDW  17.5  Neutrophil % 63.4  Lymphocyte % 6.0  Monocyte % 29.9  Eosinophil % 0.5  Basophil % 0.2  Neutrophil # 4.8  Lymphocyte #  0.5  Monocyte #  2.3  Eosinophil # 0.0  Basophil # 0.0   EKG:  EKG Interp. by me   Interpretation EKG shows NSR, 79, RBBB, TWI III, aVF    Oxycodone: Itching   Impression 80 year old male with history of chronic diastolic CHF, IDDM with renal insufficiency, HLD, HTN, morbid obesity, thrombocytopenia, episode of a-fib in early 2014  s/p successful elective cardioversion 10/27/2012, COPD, & OSA on CPAP who presented to [facility] with SOB, leg edema.   1) dyspnea multifactorial Worsening anemia likely a major contributor to his presentation as he has been compliant with his torsemide Likely a component of acute on chronic diastolic CHF, COPD with resolving bronchitis (ABX complete) ---Need to focus on anemia workup as an inpt (iron studies, epo level,)  maybe hematology consult pending results? SOB will not improve much at current HCT levels even with aggressive diuresis --Would continue lasix IV with close monitoring of renal function and potassium --Nebs, incentive spirometer, --Bowel regimen (no BM in 5 days)  2) Anemia: HCT 22 this AM this is likely contributing to leg edema, SOB work up as above  3) Leg edema Chronic venous insufficiency, he has edema edema even in setting of dehydration Would hold amlodipine (not typically taking as an outpt. Started by PMD this week?)  4). COPD exacerbation: NEBS, inhalers, incentive spirometer  5). Chronic diastolic CHF: -Continue Coreg 6.25 mg bid b. 02/2014 Echo: EF 55-60%, mild conc LVH, mildly dil LA/RA, mild-mod Ao sclerosis w/o stenosis  6). History of PAF: -Status post cardioversion 10/27/2012 -Continue amiodarone 200 mg daily  -Continue Coreg as above   Electronic Signatures: Ida Rogue (MD)  (Signed 22-Jan-16 09:42)  Authored: General Aspect/Present Illness, History and Physical Exam, Review of System, Family & Social History, Past Medical History, Home Medications, Labs, EKG , Allergies, Impression/Plan   Last Updated: 22-Jan-16 09:42 by Ida Rogue (MD)

## 2014-11-19 NOTE — Consult Note (Signed)
   Comments   I met with pt's wife and daughter. Updated them on pt's current condition. Wife brought pt's advance directive with her. Wife knows that pt does not want to be on vent longterm. Family want to see if pt improves over the next several days. If he does not progress toward extubation, they would want pt extubated to comfort care.  discussed resuscitation in the event of cardiac arrest and family want pt to be a DNR. Order entered.   Electronic Signatures: Leaf [employer], Izora Gala (MD)  (Signed 22-Feb-16 15:40)  Authored: Palliative Care   Last Updated: 22-Feb-16 15:40 by Patrycja Mumpower, Izora Gala (MD)

## 2014-11-19 NOTE — Consult Note (Signed)
   Comments   Dr Phifer and I met with pt's wife and daughter. Pt has tolerated SBT but has become more agitated as has waked. Family given the option of resedation and more time on the ventilator vs trial of extubation. Family feel that patient wants to come off the ventilator and confirm that there is no plan for reintubation. In the event of decline, pt would be transitioned to comfort care.  was then extubated and appeared comfortable. Family at bedside.  Electronic Signatures: Borders, Kirt Boys (NP)  (Signed 24-Feb-16 12:08)  Authored: Palliative Care Phifer, Izora Gala (MD)  (Signed 24-Feb-16 13:06)  Authored: Palliative Care   Last Updated: 24-Feb-16 13:06 by Phifer, Izora Gala (MD)

## 2014-11-19 NOTE — Discharge Summary (Signed)
PATIENT NAME:  [name], [name] MR#:  [id number] DATE OF BIRTH:  07/24/1934  DATE OF ADMISSION:  08/10/2014 DATE OF DISCHARGE:  08/14/2014   DISCHARGE DIAGNOSES: 1. Acute respiratory distress, multifactorial in nature due to anemia, congestive heart failure and chronic obstructive pulmonary disease.  2. Acute on chronic congestive heart failure, diastolic in nature.  3. Acute on chronic iron deficiency anemia, status post 1 unit of packed red blood cell transfusion.   4. Leg edema, likely due to Norvasc which stopped.   SECONDARY DIAGNOSES:   1.  Chronic COPD, on 2 liters home oxygen.  2.  Chronic atrial fibrillation, status post cardioversion, not on any anticoagulation.  3.  Hypertension.  4.  Insulin-dependent diabetes mellitus.  5.  Obstructive sleep apnea on CPAP.  6.  CKD stage IV.  7.  Diastolic CHF.   8    Anemia of chronic disease.    CONSULTATION: Cardiology, Dr. Julien Nordmannimothy Gollan.   PROCEDURES AND RADIOLOGY:  1. Chest x-ray on the 08/10/2014 showed mild pulmonary edema.   2. Lumbar spine x-ray the of January showed no acute lumbar spinal abnormalities. Prior posterior L4-L5 fusion with disk prosthesis and 6 mm anterolisthesis.  3. Chest x-ray on the 24th of January showed bibasilar atelectasis.   MAJOR LABORATORY PANEL: Serum erythropoetin was high with a value of 49.7. Serum vitamin B12 level was pending.   HISTORY AND SHORT HOSPITAL COURSE: The patient is a 80 year old male with above-mentioned medical problems who was admitted for difficulty breathing thought to be secondary to multifactorial in nature from acute on chronic diastolic heart failure, chronic obstructive pulmonary disease and severe acute on chronic anemia. Please see Dr. Prudencio PairKalisetti's dictated history and physical for further details. Cardiology consultation was obtained with Dr. Julien Nordmannimothy Gollan who recommended close monitoring of patient's clinical condition with aggressive diuresis. The patient was also  recommended to get 1 unit of red blood cell transfusion considering his significant anemia, which was likely contributing to his symptoms. The patient did receive 1 unit of packed red blood cells, considering underlying cardiac history and he responded well. His breathing was improving. He was diuresed fairly well, and his renal function remained close to baseline. His hemoglobin was as low as 7.3 and after 1 unit it came up to 9.0. On the date of discharge it is 8.1. The patient was feeling much better the 25th of January, very close to his baseline and was discharged home in stable condition.   PHYSICAL EXAMINATION: VITAL SIGNS: On the date of discharge, his vital signs were as follows: Temperature 98.3, heart rate 64 per minute, respirations 18 per minute, blood pressure 149/69. He was saturating 91% on room air at rest.   PERTINENT PHYSICAL EXAMINATION ON THE DATE OF DISCHARGE:  CARDIOVASCULAR: S1, S2 normal. No murmurs, rales or gallop.  LUNGS: Clear to auscultation bilaterally, rhonchi, or crepitation.  ABDOMEN: Soft, benign.  NEUROLOGIC: Nonfocal examination.  All other physical examination remained at baseline.   DISCHARGE MEDICATIONS:   Medication Instructions  lyrica 75 mg oral capsule  1 cap(s) orally 2 times a day   dexilant 60 mg oral delayed release capsule  1 cap(s) orally once a day   symbicort 80 mcg-4.5 mcg/inh inhalation aerosol  2 puff(s) inhaled 2 times a day   tamsulosin 0.4 mg oral capsule  1 cap(s) orally once a day   carvedilol 6.25 mg oral tablet  1 tab(s) orally 2 times a day   hydralazine 50 mg oral tablet  1 tab(s)  orally 4 times a day   amiodarone 200 mg oral tablet  2 tab(s) orally once a day (in the morning) and 1 in the evening.    atorvastatin 20 mg oral tablet  1 tab(s) orally once a day   clonidine 0.1 mg oral tablet  1 tab(s) orally 2 times a day   humalog 100 units/ml subcutaneous solution  10 unit(s) subcutaneous once a day (in the morning) and 10 units  at noon.    hyoscyamine 0.375 mg oral tablet, extended release  1 tab(s) orally once a day   lantus solostar pen 100 units/ml subcutaneous solution  26 unit(s) subcutaneous once a day (in the morning) and 30 units in the evening.    lexapro 10 mg oral tablet  0.5 tab(s) orally once a day   potassium chloride 20 meq oral tablet, extended release  0.5 tab(s) orally 2 times a day   proair hfa cfc free 90 mcg/inh inhalation aerosol  2 puff(s) inhaled 2 times a day   spiriva respimat 2.5 mcg/inh inhalation aerosol  2 puff(s) inhaled 2 times a day   polyethylene glycol 3350 - oral powder for reconstitution  17 gram(s) orally once a day, As Needed - for Constipation   ferrous sulfate 325 mg (65 mg elemental iron) oral tablet  1 tab(s) orally 2 times a day (with meals)   demadex 20 mg oral tablet  4 tab(s) orally 2 times a day      STOP TAKING THE FOLLOWING MEDICATION(S):    omeprazole 20 mg oral delayed release capsule: 1 cap(s) orally once a day metolazone 5 mg oral tablet: 1 tab(s) orally once a day, As Needed       amlodipine 10 mg oral tablet: 1 tab(s) orally once a day    DISCHARGE DIET: Low sodium, low fat, low cholesterol, 1800 ADA.   DISCHARGE ACTIVITY: As tolerated.   DISCHARGE INSTRUCTIONS AND FOLLOW-UP: The patient was instructed to follow up with his primary care physician, Dr. Dale Spurgeon in 2 to 4 weeks. He will need follow-up with Dr. Julien Nordmann, in 1 to 2 weeks. He was set up to get home health, physical therapy, nurse, and nursing aide. He will also need follow-up with [facility] in 4 to 6 weeks.   TOTAL TIME DISCHARGING THIS PATIENT: 45 minutes.   Please note, this patient remains at very high risk for readmission. He has already had 4 readmissions in the last 6 months. He did qualify for chronic obstructive pulmonary disease Gold protocol and this was taken care of by nurse and care management.    ____________________________ Ellamae Sia. Sherryll Burger,  MD vss:ST D: 08/14/2014 21:20:55 ET Rollins: 08/14/2014 21:43:00 ET JOB#: [id number]  cc: Jaece Ducharme S. Sherryll Burger, MD, <Dictator> Dale Asbury, MD Antonieta Iba, MD [facility]  Ellamae Sia [facility] MD ELECTRONICALLY SIGNED 08/15/2014 15:57

## 2014-11-19 NOTE — H&P (Signed)
PATIENT NAME:  [name], [name] MR#:  [id number] DATE OF BIRTH:  Apr 09, 1935  DATE OF ADMISSION:  08/10/2014  ADMITTING PHYSICIAN: Enid Baas, MD   PRIMARY CARE PHYSICIAN: Dale Mehlville, MD   PRIMARY CARDIOLOGIST: Antonieta Iba, MD    CHIEF COMPLAINT: Difficulty breathing.   HISTORY OF PRESENT ILLNESS: [name] is a 80 year old Caucasian male with past medical history significant for chronic COPD on 2 liters home oxygen, diastolic CHF, chronic atrial fibrillation, not on any anticoagulation status post cardioversion, obstructive sleep apnea on CPAP, diabetes, hypertension, CKD, presents to the hospital from PCP's office secondary to worsening shortness of breath. The patient said he has baseline dyspnea limiting his mobility. He has a walker and cane at home which he uses all the time. His dyspnea has been getting worse over the past week. He went to urgent care, was started on an antibiotic for 5 days for a possible presumed bronchitis versus pneumonia. He finished his antibiotic yesterday, went for a routine followup visit with his PCP today and during the exam she felt that he was a little bit tachypneic, he was having worsening swelling of his feet, so was sent over to the ER. Chest x-ray showed mild pulmonary edema. The patient on torsemide twice a day at home. States he has been adhering to a low sodium diet, taking his medications regularly. He is being admitted for acute on chronic CHF exacerbation.   PAST MEDICAL HISTORY:  1.  Chronic COPD, on 2 liters home oxygen.  2.  Chronic atrial fibrillation, status post cardioversion, not on any anticoagulation.  3.  Hypertension.  4.  Insulin-dependent diabetes mellitus.  5.  Obstructive sleep apnea on CPAP.  6.  CKD stage IV.  7.  Diastolic CHF.   8.  Anemia of chronic disease.   PAST SURGICAL HISTORY: 1.  Right knee surgery.  2.  Bilateral shoulder arthroscopic surgeries.   3.  Lower back surgery.   ALLERGIES TO MEDICATIONS:  ALLERGIC TO OXYCODONE.   CURRENT HOME MEDICATIONS:  1.  Lantus 26 units q.a.m. and 30 units in the evening.  2.  Humalog 10 units twice a day before meals.  3.  Omeprazole 20 mg a daily.  4.  Lexapro 5 mg p.o. daily.  5.  Flomax 0.4 mg p.o. daily.  6.  Lyrica 75 mg p.o. b.i.d.  7.  Amiodarone 400 mg q.a.m. and 200 mg in the evening.  8.  Potassium chloride 10 mEq twice a day.  9.  Torsemide 40 mg p.o. q.a.m. and 20 mg in the evening.  10.   Metolazone 5 mg p.o. daily p.r.n.  11.   Clonidine 0.1 mg p.o. b.i.d.   12.   Symbicort 160/4.5 mcg 1 puff b.i.d.  13.   Hyoscyamine 0.375 mg p.o. daily.  14.   Atorvastatin 20 mg p.o. daily.  15.   Dexilant 60 mg p.o. daily.  16.   Amlodipine 10 mg p.o. daily.  17.   Hydralazine 50 mg 4 times a day.  18.   Coreg 6.25 mg twice a day.  19.   Spiriva Respimat 2 puffs twice a day.   20.   ProAir inhaler twice a day.   SOCIAL HISTORY: Lives at home with his wife. Uses a cane and a walker for ambulation. No smoking or alcohol use.   FAMILY HISTORY: Significant for severe heart disease and congestive heart failure in the family.   REVIEW OF SYSTEMS:  CONSTITUTIONAL: No fever. Positive for fatigue and  weakness.  EYES: No blurred vision, double vision, inflammation or glaucoma. Status post cataract surgery.  ENT: No tinnitus, ear pain, hearing loss, epistaxis or discharge.  RESPIRATORY: Positive for cough, wheeze, COPD, and dyspnea.  CARDIOVASCULAR: Right-sided chest pain present and orthopnea present. Dyspnea on exertion and pedal edema present. No palpitations or syncope.  GASTROINTESTINAL: No nausea, vomiting, diarrhea, abdominal pain. Positive for constipation. No gastroesophageal reflex disease. GENITOURINARY: No dysuria, hematuria, renal calculus, frequency, or incontinence.  ENDOCRINE: No polyuria, nocturia, thyroid problems, heat or cold intolerance.  HEMATOLOGY: No anemia, easy bruising or bleeding.  SKIN: No acne, rash or lesions.   MUSCULOSKELETAL: Positive for low back pain and arthritis.  NEUROLOGICAL: No numbness, weakness, CVA, TIA or seizures. PSYCHOLOGICAL: No anxiety, insomnia, depression.   PHYSICAL EXAMINATION:   VITAL SIGNS: Temperature 97.6 degrees Fahrenheit, pulse 69, respirations 22, blood pressure 195/75, pulse oximetry 97% on 2 liters.  GENERAL: Heavily built, well-nourished male, sitting in bed, not in any acute distress.  HEENT: Normocephalic, atraumatic. Pupils equal, round, reacting to light. Anicteric sclerae. Extraocular movements intact.  OROPHARYNX: Clear without erythema, mass or exudates. NECK: Supple. No thyromegaly, JVD or carotid bruits. No lymphadenopathy.  LUNGS: Moving air bilaterally. Bibasilar crackles heard. No wheezes or rhonchi. No use of accessory muscles for breathing.  CARDIOVASCULAR: S1, S2. Regular rate and rhythm. A 3/6 systolic murmur heard. No rubs or gallops.  ABDOMEN: Obese, soft, nontender, nondistended. No hepatosplenomegaly. Normal bowel sounds.  EXTREMITIES: 3 to 4+ pedal edema, bilateral lower extremities, mild erythema. No tenderness noted. I am unable to palpate dorsalis pedis pulses. No clubbing or cyanosis.  SKIN: No acne, rash or lesions.  LYMPHATICS: No cervical lymphadenopathy.  NEUROLOGIC: Cranial nerves intact. No focal motor or sensory deficits.  PSYCHOLOGICAL: The patient is awake, alert, oriented x 3.   LABORATORY DATA: WBC 11.4, hemoglobin 7.7, hematocrit 24.2, platelet count 92,000. Sodium 141, potassium 4.5, chloride 106, bicarbonate 27, BUN 23, creatinine 2.03, glucose 151, calcium of 7.9. Troponin is negative. Chest x-ray showing bilateral interstitial thickening. No pleural effusion. No consolidation. Findings consistent with mild pulmonary edema. EKG showing atrial fibrillation, heart rate of 62.   ASSESSMENT AND PLAN: This is a 80 year old, obese, Caucasian male with past medical history significant for chronic obstructive pulmonary disease,  congestive heart failure, on home oxygen, chronic atrial fibrillation not on anticoagulation, obstructive sleep apnea on CPAP, admitted for worsening respiratory status.  1.  Acute on chronic congestive heart failure, diastolic dysfunction. Last echocardiogram from September 2015 showing normal ejection fraction. Will start IV diuresis with Lasix b.i.d., get a cardiology consultation. Potassium supplements as needed. The patient recently treated with antibiotics for pneumonia, just finished yesterday. Chest x-ray with no consolidation, so we will hold off on starting antibiotics again at this time. If pedal edema is worsening, Norvasc might not be a good choice.  2.  Chronic obstructive pulmonary disease, on 2 liters. Continue his inhalers and nebulizer treatments as needed. No need for systemic steroids.  3.  Obstructive sleep apnea. Continue CPAP.  4.  Diabetes mellitus. Continue his Lantus sliding scale insulin and load with meals.  5.  Hypertension. Continue home medications.  6.  Chronic kidney disease stage IV. Creatinine at 2, seems to be at baseline. Continue to monitor while being diuresed.  7.  Deep vein thrombosis prophylaxis.  8.  Chronic atrial fibrillation. Rate well-controlled. Continue his Coreg and amiodarone. Not on any anticoagulation, likely high risk of falls.  9.  Physical therapy consultation for recent falls.  CODE STATUS: Full code.   TIME SPENT ON ADMISSION: 50 minutes.   ____________________________ Enid Baasadhika Sora Olivo, MD rk:at D: 08/10/2014 17:29:56 ET T: 08/10/2014 17:46:39 ET JOB#: [id number]  cc: Enid Baasadhika Aadit Hagood, MD, <Dictator> Antonieta Ibaimothy J. Gollan, MD Dale Durhamharlene Scott, MD  Enid BaasADHIKA Mariaceleste Herrera MD ELECTRONICALLY SIGNED 08/13/2014 14:13

## 2014-11-19 NOTE — H&P (Signed)
PATIENT NAME:  [name], [name] MR#:  [id number] DATE OF BIRTH:  1935-01-12  DATE OF ADMISSION:  09/07/2014  REFERRING EMERGENCY ROOM PHYSICIAN:  Su Ley, MD  PRIMARY CARE PHYSICIAN:  Dale Denhoff, MD  CHIEF COMPLAINT:  Shortness of breath and hallucinations.   HISTORY OF PRESENT ILLNESS:  This 80 year old man with past medical history of COPD, chronic respiratory failure on 2 liters via nasal cannula at home, diastolic congestive heart failure, obstructive sleep apnea on CPAP at home, and history of MRSA pneumonia presents today with gradual worsening of respiratory status. He reports that frankly he has not felt well in years. He states that for the past few days, he has been getting more and more short of breath to the point where he cannot move around. He has not been able to sleep due to shortness of breath, cannot lie down flat, but sitting up does not help either. He denies fevers or chills. He has had some brown-appearing sputum. He reports a bandlike pain around the lower chest with any sort of exertion, which is short in duration and resolves quickly. On presentation to the Emergency Room, he has increased work of breathing and is hypoxic with an oxygen requirement of 3 to 4 liters to keep his saturations above 90. Chest x-ray shows bilateral pneumonia. This is his fifth admission in 6 months.   Regarding hallucinations, the patient reports that for the past month he has been seeing people who are not actually present. He has thought that he was performing projects such as building a birdhouse, moves his hands and sees the project, and then discoveres that he is actually not performing these activities. He is moving his hands, but there is no birdhouse. He has been working with his primary care physician on this, and they have tapering down his Lexapro dose, thinking that these hallucinations are due to a drug effect.  PAST MEDICAL HISTORY: 1.  COPD.  2.  History of MRSA pneumonia  in November 2015.  3.  Chronic atrial fibrillation status post cardioversion, not on anticoagulation.  4.  Hypertension.  5.  Insulin-dependent diabetes mellitus.  6.  Obstructive sleep apnea on CPAP.  7.  Chronic kidney disease stage III/IV.  8.  Diastolic congestive heart failure.  9.  Anemia of chronic disease.  10.  Chronic respiratory failure on 2 liters by nasal cannula at home.   PAST SURGICAL HISTORY: 1.  Right knee surgery.  2.  Bilateral shoulder arthroscopy.  3.  Low back surgery.   SOCIAL HISTORY:  The patient lives at home with his wife and daughter. He is on oxygen chronically. He is retired from [employer]. He uses a cane or walker for ambulation. He does not smoke cigarettes, drink alcohol, or use any illicit substances.   FAMILY HISTORY:  Positive for heart disease and congestive heart failure in multiple family members.   ALLERGIES:  OXYCODONE, WHICH CAUSES ITCHING.   HOME MEDICATIONS: 1.  Torsemide 2 tablets twice a day.  2.  Tamsulosin 0.4 mg 1 capsule once a day.  3.  Symbicort 80/4.5 mcg per inhalation 2 puffs inhaled twice a day.  4.  Spiriva Respimat 2.5 mg per inhalation 2 puffs inhaled once a day.  5.  ProAir HFA CFC-free 90 mcg per inhalation 2 puffs inhaled 4 times a day as needed for shortness of breath.  6.  Potassium chloride 20 mEq 1 tablet once a day.  7.  Lyrica 75 mg 1 capsule twice a day.  8.  Lexapro 10 mg 0.5 tablets orally once a day.  9.  Levalbuterol 1.25 mg in 3 mL, 3 mL inhaled 3 times a day as needed for shortness of breath.  10.  Lantus SoloSTAR 26 units in the morning and 30 units at bedtime.  11.  Hydralazine 50 mg 1 tablet 4 times a day.  12.  Humalog 100 units/mL 10 units twice a day.  13.  Flonase 50 mcg per inhalation nasal spray 1 spray to each nostril once a day at bedtime.  14.  Ferrous sulfate 325 mg 1 tablet twice a day.  15.  Dexilant 60 mg 1 capsule once a day.  16.  Clonidine 0.3 mg 1 tablet twice a day.  17.  Carvedilol 6.25 mg  1 tablet twice a day.  18.  Atorvastatin 20 mg 1 tablet once a day.  19.  Aspirin 325 mg 1 tablet once a day.  20.  Amlodipine 10 mg 1 tablet once a day.  21.  Amiodarone 200 mg 1 tablet once a day.   REVIEW OF SYSTEMS: CONSTITUTIONAL:  Negative for fever. Positive for fatigue and weakness. No pain. No change in weight.  HEENT:  No change in vision or hearing. No pain in the eyes or ears. No difficulty swallowing, sinus congestion, or sore throat.  RESPIRATORY:  Positive for shortness of breath, cough, and wheezing. No hemoptysis. No painful respirations.  CARDIOVASCULAR:  No chest pain. Positive for orthopnea and edema. No palpitations or syncope.  GASTROINTESTINAL:  No nausea, vomiting, diarrhea, or abdominal pain. Positive for decreased appetite.  GENITOURINARY:  No dysuria or frequency.  ENDOCRINE:  No polyuria, polydipsia, or hot or cold intolerance.  HEMATOLOGIC:  Positive for history of chronic anemia. No easy bruising or bleeding.  SKIN:  No new rashes or lesions.  MUSCULOSKELETAL:  No new pain in the neck, back, knees, shoulders, or hips. He does have osteoarthritis.  NEUROLOGIC:  No focal numbness or weakness. Positive for hallucinations. No history of CVA or seizure.  PSYCHIATRIC:  Positive for depression, which he reports is controlled at this time.   PHYSICAL EXAMINATION: VITAL SIGNS:  Temperature 98.1, pulse 55, respirations 18, blood pressure 140/66, oxygenation 98% on 4 liters via nasal cannula.  GENERAL:  The patient appears weak, pale, and fairly ill.  HEENT AND NECK:  Pupils are equal, round, and reactive to light. Conjunctivae are pale. Extraocular motion is intact. Mucous membranes are dry. Posterior oropharynx is clear with no exudate, edema, or erythema. Good dentition. No cervical lymphadenopathy. Trachea is midline. Thyroid is nontender.  RESPIRATORY:  Rapid respirations. Fair air movement. There are diffuse crackles and wheezes bilaterally in the upper and lower lung  fields.  CARDIOVASCULAR:  Distant lung sounds. Regular rate and rhythm. There is a 3/6 systolic ejection murmur. There is 3+ pitting edema bilaterally in the lower extremities to the knees. Peripheral pulses are very diminished due to edema.  ABDOMEN:  Distended, nontender. No guarding. No rebound. No hepatosplenomegaly noted. Bowel sounds are normal.  MUSCULOSKELETAL:  No joint effusions. Range of motion is normal. Strength is diffusely decreased at 3+ to 4 throughout.  NEUROLOGIC:  Cranial nerves II through XII are grossly intact. Strength and sensation are intact bilaterally and symmetric.  PSYCHIATRIC:  The patient appears depressed. He is alert and oriented and has fairly good insight into his clinical condition.   LABORATORY DATA:  Sodium is 142, potassium 4.1, chloride 106, bicarbonate 30, BUN 27, creatinine 2.36, glucose 118. BNP is 1265.  Hemoglobin A1c is 6.2. Total protein is 6.2, albumin 3.0. LFTs are normal. CK is 458. CK-MB is 8.3. Troponin is 0.03. White blood cells are 6.1, hemoglobin 7.0, platelets 92,000, and MCV is 92.   IMAGING:  Chest x-ray shows subtle patchy density of the lungs in bilateral mid upper lung fields, which could reflect bronchopneumonia. There is basilar scarring with superimposed atelectasis and bronchial wall thickening compatible with bronchitis. No edema, effusion, or pneumothorax.   ASSESSMENT AND PLAN: 1.  Hallucinations. The patient reports that he and his primary care physician think this is a medication effect, and they are decreasing his Lexapro. I will hold Lexapro for now. I will also get an ABG to assess for hypercarbia.  2.  Bradycardia. The patient reports that he has a chronic bradycardia with a heart rate in the 50s. Right now, his heart rate is in the 40s to 50s. We will observe on telemetry. We will continue amiodarone but hold his Coreg at this time. Could consider a lower dose of amiodarone. He is fairly weak. I cannot tell if this is due to  symptomatic bradycardia or other ongoing medical issues.  3.  Acute-on-chronic respiratory failure with hypoxia. Increased oxygen demand, currently on 4 liters with saturations above 90. Multifactorial and due to pneumonia and chronic obstructive pulmonary disease. We will treat as below. Provide supplemental oxygen. Check an ABG.  4.  Bilateral upper lobe pneumonia. He does have a history of methicillin-resistant Staphylococcus aureus pneumonia. We will continue on vancomycin, Zosyn, and Levaquin. Blood cultures and sputum cultures are pending.  5.  Acute-on-chronic diastolic congestive heart failure. Last 2-D echocardiogram in September 2015 shows preserved ejection fraction but significant left ventricular hypertrophy. BNP is elevated today. His chest x-ray does not show overt edema, but he does have profound dependent edema and crackles on examination. We will continue with his torsemide and add an additional dose of Lasix now. Check daily weights. Low-sodium diet. Continue ACE inhibitor but hold Coreg for now.  6.  Chronic kidney disease stage III/IV. His creatinine is slightly worse than baseline today. Monitor carefully during diuresis.  7.  Chronic obstructive pulmonary disease. Continue home inhalers. Add nebulizers. No steroids for now. Continue supplemental oxygen as needed.  8.  Anemia of chronic disease. Hemoglobin has dropped from 8 to 7 over the past month. No bleeding has been noted. This may be dilution due to volume expansion. We will diurese. If hemoglobin remains 7 or below, would transfuse. He was to get a gastrointestinal workup after his last discharge, which I do not believe has been done. Guaiac stools.  9.  Obstructive sleep apnea. Continue with continuous positive airway pressure at night.  10.  Goals of care. I have consulted palliative care for goals of care and for frequent admissions. This patient may be a good candidate for hospice at home. I attempted to discuss his CODE  STATUS with him, but he could not give a clear answer.   TIME SPENT ON ADMISSION:  55 minutes.   ____________________________ Ena Dawley. Clent Ridges, MD cpw:nb D: 09/07/2014 15:43:49 ET T: 09/07/2014 16:01:07 ET JOB#: [id number]  cc: Santina Evans P. Clent Ridges, MD, <Dictator> Gale Journey MD ELECTRONICALLY SIGNED 09/07/2014 23:27

## 2014-11-19 NOTE — Consult Note (Signed)
General Aspect Primary Cardiologist: Dr. Rockey Situ, MD _______________  80 year old male with past history of morbid obesity, chronic diastolic CHF, hypertension, hypercholesterolemia, diabetes with renal insufficiency, thrombocytopenia patient of Dr. Nicki Reaper,?? episode of atrial fibrillation in early 2014,?? COPD, obstructive sleep apnea, on CPAP, chronic diastolic CHF,?? smoked for 25 years, chronic leg edema,  elective cardioversion on October 27 2012, and anemia  who presents with worsening SOB, acute on chronic diastolic CHF, bilateral upper lobe PNA, bradycardia (history of), and hallucinations. _______________  PMH: 1. Morbid obesity 2. Chronic diastolic CHF 3. HTN 4. HLD 5. DM with renal insufficiency 6. Thrombocytopenia 7. Episode of a-fib in early 2014 8. Anemia ________________   Present Illness 80 year old male with the above problem list who presented to [facility] on 2/18 with increased SOB, acute on chronic diastolic CHF, bilateral upper lobe PNA, bradycardia (history of), and hallucinations. Multiple recent hospital admissions with an admission to the hospital 06/01/14 with discharge 06/03/2014, admitted with shortness of breath, weakness, flank pain. His creatinine was elevated, greater than 3 consistent with dehydration from overuse of torsemide. He had a cough with shortness of breath and was started on antibiotics for bronchitis. Of note he had lower extremity edema in the hospital despite dehydration. He was started on oxygen for significant hypoxia. Saturations into the mid to low 80s on room air, worse with exertion. Baseline weight 295 to 305. Back??in the hospital 06/26/2013 for shortness of breath. BNP at that time 4500. He had aggressive diuresis and was -11 L through his hospital course. Echo during prior September 2015 admission showed ejection fraction 55-60%, creatinine 2.1 but went as high as 2.4, in normal sinus rhythm during his hospital course. Admitted again January 2016 for  dyspnea, and new onset anemia with hgb down to 7.3. He received 1 unit pRBC with improvement of hgb. Discharge hgb 8.6.   He has history of??severe neck pain. He reports having an episode of neck pain previously that seemed to resolve after an MRI. Then he developed hip pain and was seeking care from an orthopedic specialist.??He reports being compliant with his CPAP.  He returns to [facility] with increased SOB, increased weight gain up to 319 currently, ortthopnea, though improved LEE. He also noted hallucinations for the past 3-4 months that are intermittent. He realizes that he is hallucinating. He feels like others are presents when they are not, then realizes he is hallucinating and stops. He has also been hallucinating that he has been working with wood when he has not. Taking his medications as directed. Upon his arrival to [facility] he was found to have bilateral upper lobe PNA, pBNP of 1265, SCr 2.34, hgb 7.0. He was started on IV Levaquin, vanc, and Zosyn. His BB was held.   Physical Exam:  GEN no acute distress   HEENT hearing intact to voice   NECK supple   RESP normal resp effort  rhonchi  crackles   CARD Regular rate and rhythm  No murmur   ABD denies tenderness  distended   EXTR 2+ pitting edema to the thighs bilaterally   SKIN normal to palpation   NEURO cranial nerves intact   PSYCH alert   Review of Systems:  General: Fatigue  Weakness   Skin: No Complaints   ENT: No Complaints   Eyes: No Complaints   Neck: No Complaints   Respiratory: Frequent cough  Short of breath  Wheezing   Cardiovascular: Dyspnea  Orthopnea  Edema   Gastrointestinal: No Complaints  Genitourinary: No Complaints   Vascular: No Complaints   Musculoskeletal: No Complaints   Neurologic: No Complaints   Hematologic: No Complaints   Endocrine: No Complaints   Psychiatric: hallucinations   Review of Systems: All other systems were reviewed and found to be negative    Medications/Allergies Reviewed Medications/Allergies reviewed   Family & Social History:  Family and Social History:  Family History Coronary Artery Disease  Hypertension  Diabetes Mellitus  COPD  Cancer   Social History negative ETOH, negative Illicit drugs   + Tobacco Prior (greater than 1 year)  25 pack years   Place of Living Home     Multi-drug Resistant Organism (MDRO): Positive culture for MRSA., 16-Jun-2014   COPD:    Atrial Fibrillation:    htn:    Diabetes:    COPD:    Knee Surgery - Right:    cataract surgery:    back surgery L3 L4:   Home Medications: Medication Instructions Status  ferrous sulfate 325 mg (65 mg elemental iron) oral tablet 1 tab(s) orally 2 times a day (with meals) Active  Lyrica 75 mg oral capsule 1 cap(s) orally 2 times a day Active  carvedilol 6.25 mg oral tablet 1 tab(s) orally 2 times a day Active  Dexilant 60 mg oral delayed release capsule 1 cap(s) orally once a day Active  Symbicort 80 mcg-4.5 mcg/inh inhalation aerosol 2 puff(s) inhaled 2 times a day Active  tamsulosin 0.4 mg oral capsule 1 cap(s) orally once a day Active  hydrALAZINE 50 mg oral tablet 1 tab(s) orally 4 times a day Active  atorvastatin 20 mg oral tablet 1 tab(s) orally once a day Active  Lantus Solostar Pen 100 units/mL subcutaneous solution 26 unit(s) subcutaneous once a day (in the morning) Active  Lexapro 10 mg oral tablet 0.5 tab(s) orally once a day Active  amiodarone 200 mg oral tablet 1 tab(s) orally once a day Active  HumaLOG 100 units/mL subcutaneous solution 10 unit(s) subcutaneous 2 times a day Active  Lantus 100 units/mL subcutaneous solution 30 unit(s) subcutaneous once a day (in the evening) Active  aspirin 325 mg oral tablet 1 tab(s) orally once a day Active  Flonase 50 mcg/inh nasal spray 1 spray(s) nasal once a day (at bedtime) Active  cloNIDine 0.3 mg oral tablet 1 tab(s) orally 2 times a day Active  torsemide 20 mg oral tablet 2 tab(s) orally 2  times a day Active  ProAir HFA CFC free 90 mcg/inh inhalation aerosol 2 puff(s) inhaled 4 times a day, As Needed Active  potassium chloride 20 mEq oral tablet, extended release 1 tab(s) orally once a day Active  Spiriva Respimat 2.5 mcg/inh inhalation aerosol 2 puff(s) inhaled once a day Active  amLODIPine 10 mg oral tablet 1 tab(s) orally once a day Active  levalbuterol 1.25 mg/3 mL inhalation solution 3 milliliter(s) inhaled 3 times a day, As Needed Active   Lab Results:  Hepatic:  18-Feb-16 10:27   Bilirubin, Total 0.5  Alkaline Phosphatase 106  SGPT (ALT) 19  SGOT (AST) 31  Total Protein, Serum  6.2  Albumin, Serum  3.0  Lab:  18-Feb-16 13:45   pH (ABG) 7.350 (7.350-7.450 NOTE: New Reference Range 02/11/14)  PO2  76 (83-108 NOTE: New Reference Range 02/11/14)  FiO2 32  Routine Chem:  18-Feb-16 10:27   Glucose, Serum  118  BUN  27  Creatinine (comp)  2.34  Sodium, Serum 142  Potassium, Serum 4.1  Chloride, Serum 106  CO2, Serum 30  Calcium (Total), Serum  7.6  Osmolality (calc) 289  eGFR (African American)  35  eGFR (Non-African American)  29 (eGFR values <66m/min/1.73 m2 may be an indication of chronic kidney disease (CKD). Calculated eGFR, using the MRDR Study equation, is useful in  patients with stable renal function. The eGFR calculation will not be reliable in acutely ill patients when serum creatinine is changing rapidly. It is not useful in patients on dialysis. The eGFR calculation may not be applicable to patients at the low and high extremes of body sizes, pregnant women, and vegetarians.)  Anion Gap  6  Hemoglobin A1c ([facility]) 6.2 (The American Diabetes Association recommends that a primary goal of therapy should be <7% and that physicians should reevaluate the treatment regimen in patients with HbA1c values consistently >8%.)  B-Type Natriuretic Peptide ([facility]  1265 (Result(s) reported on 07 Sep 2014 at 11:10AM.)  Cardiac:  18-Feb-16 10:27   CK, Total   458  CPK-MB, Serum  8.3 (Result(s) reported on 07 Sep 2014 at 11:10AM.)  Troponin I 0.03 (0.00-0.05 0.05 ng/mL or less: NEGATIVE  Repeat testing in 3-6 hrs  if clinically indicated. >0.05 ng/mL: POTENTIAL  MYOCARDIAL INJURY. Repeat  testing in 3-6 hrs if  clinically indicated. NOTE: An increase or decrease  of 30% or more on serial  testing suggests a  clinically important change)  Routine Hem:  18-Feb-16 10:27   WBC (CBC) 6.1  RBC (CBC)  2.40  Hemoglobin (CBC)  7.0  Hematocrit (CBC)  22.1  Platelet Count (CBC)  92 (Result(s) reported on 07 Sep 2014 at 10:53AM.)  MCV 92  MCH 29.0  MCHC  31.5  RDW  16.6   EKG:  EKG Interp. by me   Interpretation not on the floor   Radiology Results: XRay:    18-Feb-16 10:45, Chest PA and Lateral  Chest PA and Lateral   REASON FOR EXAM:    Shortness of Breath  COMMENTS:   May transport without cardiac monitor    PROCEDURE: DXR - DXR CHEST PA (OR AP) AND LATERAL  - Sep 07 2014 10:45AM     CLINICAL DATA:  Shortness of breath    EXAM:  CHEST  2 VIEW    COMPARISON:  08/13/2014    FINDINGS:  Subtle patchy density in the bilateral mid upper lungs. There is  chronic scarring in the lower lobes, with superimposed bandlike  atelectasis on the right. Bronchial wall thickening compatible with  bronchitis, no edema, effusion, or pneumothorax. Normal heart size  and mediastinal contours.     IMPRESSION:  1. Subtle patchy density of the lungs which could reflect a  bronchopneumonia.  2. Bibasilar scarring with superimposed atelectasis.      Electronically Signed    By: JMonte FantasiaM.D.    On: 09/07/2014 11:04       Verified By: JGilford Silvius M.D.,    Oxycodone: Itching  Vital Signs/Nurse's Notes: **Vital Signs.:   18-Feb-16 15:14  Vital Signs Type Admission  Temperature Temperature (F) 97.4  Celsius 36.3  Temperature Source oral  Pulse Pulse 62  Respirations Respirations 20  Systolic BP Systolic BP 1790  Diastolic BP (mmHg) Diastolic BP (mmHg) 68  Mean BP 95  Pulse Ox % Pulse Ox % 93  Pulse Ox Activity Level  At rest  Oxygen Delivery 2L    Impression 80year old male with past history of morbid obesity, chronic diastolic CHF, hypertension, hypercholesterolemia, diabetes with renal insufficiency, thrombocytopenia patient of Dr. SNicki Reaper??  episode of atrial fibrillation in early 2014,?? COPD, obstructive sleep apnea, on CPAP, chronic diastolic CHF,?? smoked for 25 years, chronic leg edema,  elective cardioversion on October 27 2012, and anemia  who presents with worsening SOB, acute on chronic diastolic CHF, bilateral upper lobe PNA, bradycardia (history of), and hallucinations.  1. Acute on chronic diastolic CHF: -SCr stable, diuresis with IV Lasix 40 mg tid with close monitoring of SCr -Coreg 6.25 mg bid held 2/2 bradycardic HR upon admission, he reports this is a longstanding issue. Upon review of his chart in Epic I only see one encounter (his last in Nov) where he had a bradycardic HR. Would monitor off BB for time being and attempt to place back on low dose Coreg 3.125 mg bid if HR responds.  -Recent echo with normal EF (03/2014), no need to repeat at this time  2. Persistent A-fib: in sinus rhythm on Amiodarone. was bradycardiac on presentation.  -BB held as above -Monitor on tele -No angina, CE negative thus far - no anticoagulation due to anemia and thrombocytopenia.   3. Bilateral upper lobe PNA: -On vanc, Levaquin, and Zosyn -Blood and sputum cultures pending  4. Anemia: -Hgb 8.6 at discharge end of January, now presents with hgb of 7.0 -Likely playing a role in some of his SOB and and leg edema -Consider transfusion to hgb of 8 or 9  5. Hallucinations: -He reports this being an intermittent issue for the past 3-4 months -CO2 elevated, on 2L O2 [place] -Per IM  6. Acute on chronic respiratory failure: -Likely 2/2 PNA and acute on chronic diastolic CHF -See #1 and #3 -On O2  7.  CKD stage III: -SCr stable -Monitor  8. COPD: -Inhalers and nebs per IM   Plan The patient was seen and examined. I modified the note as deemed necessary.   MFletcher Anon.   Electronic Signatures: Kathlyn Sacramento (MD)  (Signed 19-Feb-16 17:11)  Authored: Impression/Plan  Co-Signer: General Aspect/Present Illness, History and Physical Exam, Review of System, Family & Social History, Past Medical History, Home Medications, Labs, EKG , Radiology, Allergies, Vital Signs/Nurse's Notes, Impression/Plan Midas Daughety M (PA-C)  (Signed 18-Feb-16 16:41)  Authored: General Aspect/Present Illness, History and Physical Exam, Review of System, Family & Social History, Past Medical History, Home Medications, Labs, EKG , Radiology, Allergies, Vital Signs/Nurse's Notes, Impression/Plan   Last Updated: 19-Feb-16 17:11 by Kathlyn Sacramento (MD)

## 2014-11-19 NOTE — Discharge Summary (Signed)
PATIENT NAME:  [name], [name] MR#:  [id number] DATE OF BIRTH:  [birth year]  DATE OF ADMISSION:  09/07/2014 DATE OF DISCHARGE:  09/14/2014  The patient went to hospice home.    HOSPICE DIAGNOSES:  Respiratory failure, multiple lobar pneumonia.   DISCHARGE MEDICATION FOR HOSPICE: 1. Morphine 20 mg/mL.  The patient will take one 2.5 mL every 1 to 2 hours as needed for pain and dyspnea.  2. Ativan 0.5 mg 1 to 2 tablets every 2 to 4 hours sublingual for anxiety and agitation.    CODE STATUS: DO NOT RESUSCITATE.   CONSULTATIONS:  1. Palliative care,  Efraim Kaufmann, MD. 2. Hospice liaison is [place]. 3. Pulmonary and critical care consult with Mariane Duval, [place]:  1. The patient is a 80 year old morbidly obese male who has been admitted 5 times in the last 6 months, comes in because of shortness of breath and hallucinations.  The patient also noted to have some bandlike chest pain and some brown sputum on admission. Please look at the history and physical for full details. The patient's O2 saturations on 3 to 4 liters were about 90%.  The patient's chest x-ray showed bilateral pneumonia. The patient admitted to hospitalist service for acute on chronic respiratory failure with hypoxia with multilobar pneumonia and COPD exacerbation. The patient started on vancomycin, Zosyn and Levaquin along with oxygen and steroids. The patient continued to get worse with O2 saturation dropping to 80% and transferred to ICU, started on BiPAP. Despite the BiPAP for a few hours the patient continued to have hypoxia with altered mental status so the patient was intubated on February 21st. The patient really had a very difficult intubation.  Respiratory therapist, ER physician tried without success, so we called anesthesia, Dr. Marcello Moores came and intubated the patient.  It took about 30 minutes to intubate the patient.  Post intubation,  the patient needed a high PEEP of 10 and also FiO2 of 100 for quite some  time.  The patient noted to have worsening pneumonia in the left lung. The patient continued on vent support, continued on steroids,  nebulizers.  Pulmonary and critical care consult, Dr. Mortimer Fries, obtained.  The patient also was seen by Dr. Devona Konig over the weekend.  The patient's family met with Dr. Ermalinda Memos to discuss the goals of medical therapy. The patient was given a trial of weaning trials but the patient could not tolerate that, then Dr. Ermalinda Memos met with the family and family decided to make him comfort care so he was extubated without plan for reintubation and he was moved to 1C.  After talking to the family, the family decided to move him to hospice home. The patient admitted to hospice home. The patient had multiple medical problems of oxygen -dependent COPD, multi-lobar pneumonia, 5 hospitalizations in the past 6 months.  2.  Acute on chronic diastolic heart failure. The patient received diuresis, 80 mg IV b.i.d.  Seen by Dr. Fletcher Anon from [facility]. The patient has a long-standing history of CHF, all diet and medication noncompliance. When he had respiratory failure, we increased the Lasix to 80 mg b.i.d.  3.  Bradycardia. The patient's Coreg was stopped then continued on amiodarone. The patient's remained asymptomatic.  Did not require any pacemaker.  4.  Atrial fibrillation. It is like paroxysmal and the patient is not a candidate for Coumadin because of anemia.  5.  Chronic kidney disease stage 3.  The patient has stable creatinine. 6.  History  of MRSA pneumonia. He was continued on isolation.   The patient's blood cultures did not show any growth. The patient's white count on admission 6.1, hemoglobin 7, hematocrit 22.1, . Electrolytes on admission: Sodium 142, potassium 4.1, chloride 106, bicarbonate 30, BUN 37, creatinine 2.3, glucose 118.  The patient's blood cultures have been negative. The patient received 1 unit of transfusion with a followup hemoglobin of 7. The patient's ABG  showed pH of 7.350, pCO2 of 60, pO2 of 76, FiO2 showed saturations were 96%.   Troponins are less than 0.03. His creatinine the next day was 2.3. The patient's ABG on the February 20, pH 2.70, pCO2 75, pO2 61 and bicarb was 34. Oxygen saturations were 90%.  This is on 100% nonrebreather.   The patient's hemoglobin on February 21 was 8.6, white count 12.2.  CT chest was done on February 21 showed a dense consolidation of the left lower lobe involving the majority of the left lower lobe and has multilobar pneumonia.. Also is noted to have a right upper lobe infiltrate. The patient's ABG on February 21 pH 7.46, CO2 46. pO2 55. The patient's ABG is on 98% vent and saturation was 91% and this was done on FIO2 of 60%.  Creatinine of 2.73 on February 22. Sputum cultures showed candida albicans, that is from February 22.  The patient had a creatinine of 2.49 on February 24.  Sugars have been around the 170s.  He also was given insulin drip as per CCU protocol and then that was also stopped and the patient went to the hospice home.   TIME SPENT ON DISCHARGE PREPARATION:  More than 30 minutes.    ____________________________ Epifanio Lesches, MD sk:at D: 09/16/2014 08:56:06 ET T: 09/16/2014 17:02:03 ET JOB#: [id number]  cc: Epifanio Lesches, MD, <Dictator> Epifanio Lesches MD ELECTRONICALLY SIGNED 09/25/2014 15:08

## 2016-09-10 IMAGING — CT CT ABD-PELV W/O CM
2 of 4 series · 15 of 46 positions shown, 17 images · non-contrast
Comparison: CT abdomen 10/04/2010

CLINICAL DATA: Right flank pain, shortness of breath, and weakness
for 5 days.

EXAM:
CT ABDOMEN AND PELVIS WITHOUT CONTRAST
TECHNIQUE: Multidetector CT imaging of the abdomen and pelvis was performed
following the standard protocol without IV contrast.

[Series 2: stone standard full · axial · 0.87mm/px · z∈[-123,+447]mm · 12 of 126 slices shown, 14 images]
[im 6/126  soft-tissue]
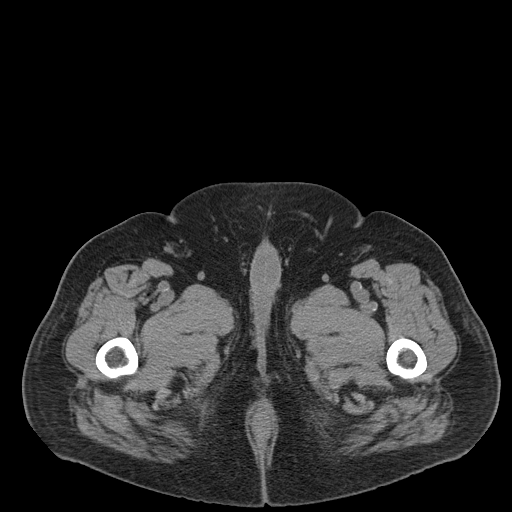
[im 6/126  bone]
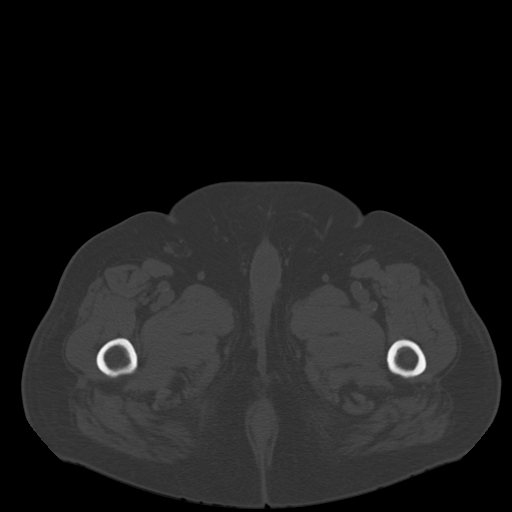
[im 16/126  soft-tissue]
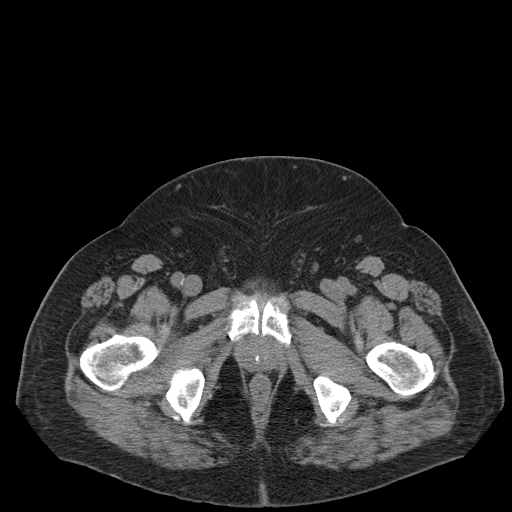
[im 27/126  soft-tissue]
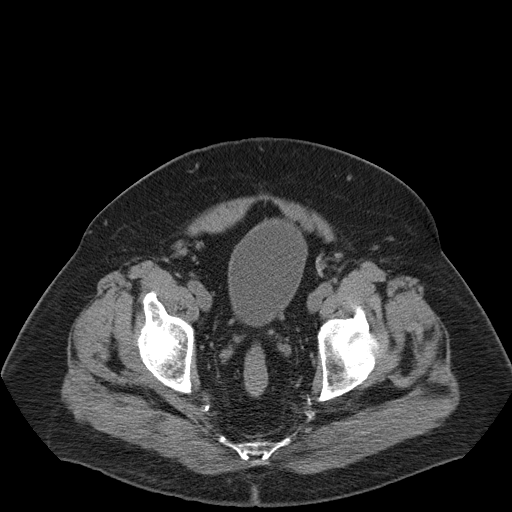
[im 37/126  soft-tissue]
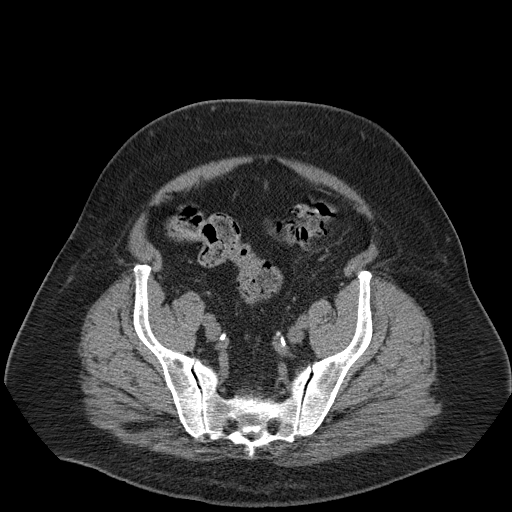
[im 47/126  soft-tissue]
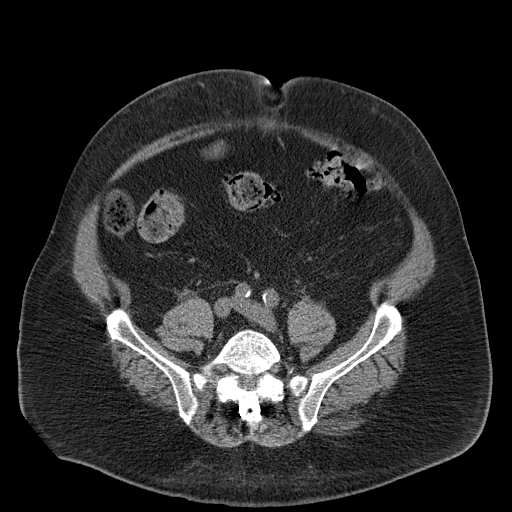
[im 58/126  soft-tissue]
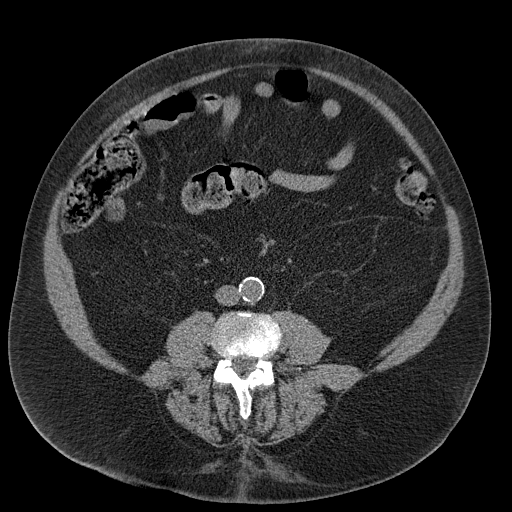
[im 68/126  soft-tissue]
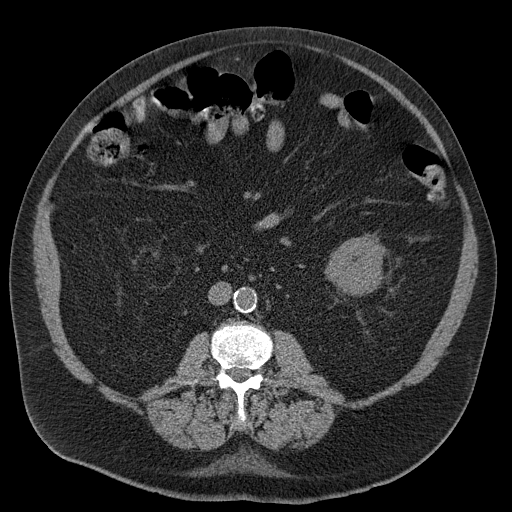
[im 79/126  soft-tissue]
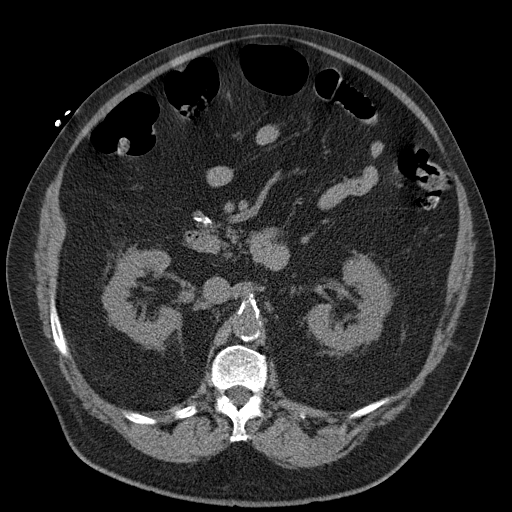
[im 89/126  soft-tissue]
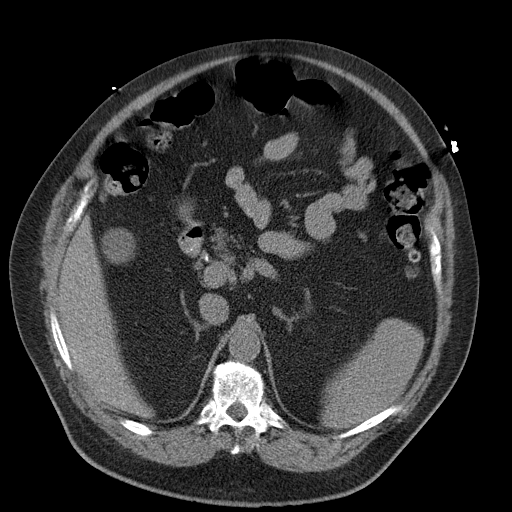
[im 89/126  bone]
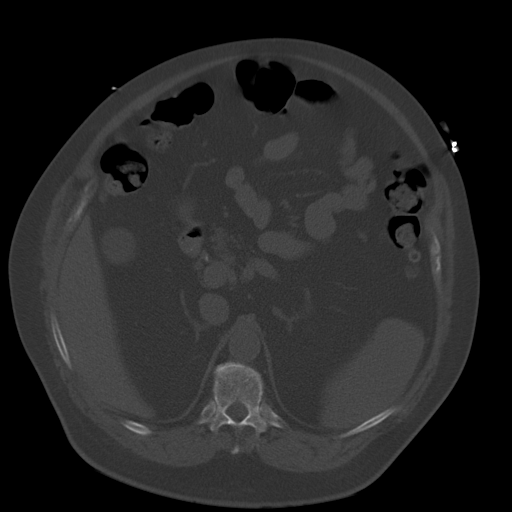
[im 99/126  soft-tissue]
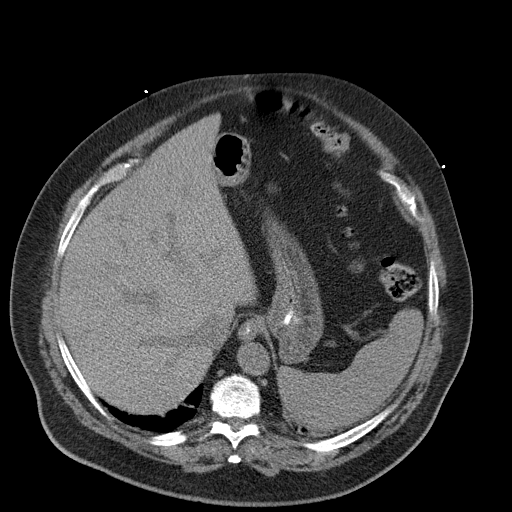
[im 110/126  soft-tissue]
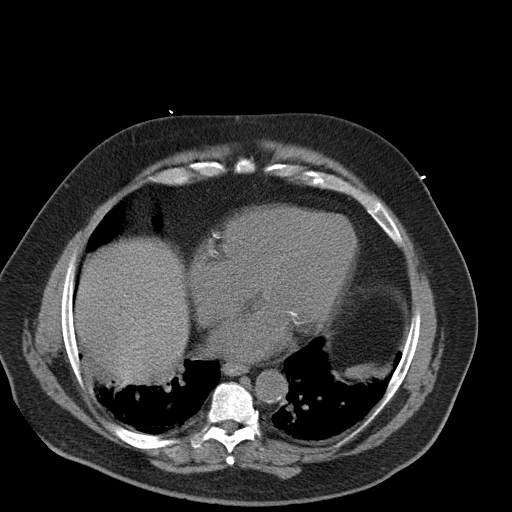
[im 120/126  soft-tissue]
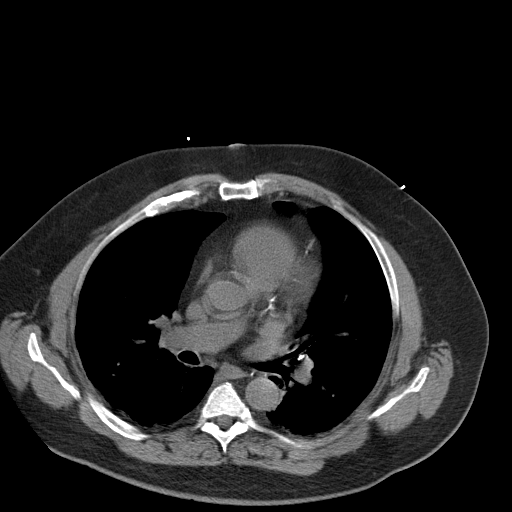

[Series 5: cor stone standard full · coronal · 0.89mm/px · 3 of 206 slices shown]
[im 69/206  soft-tissue]
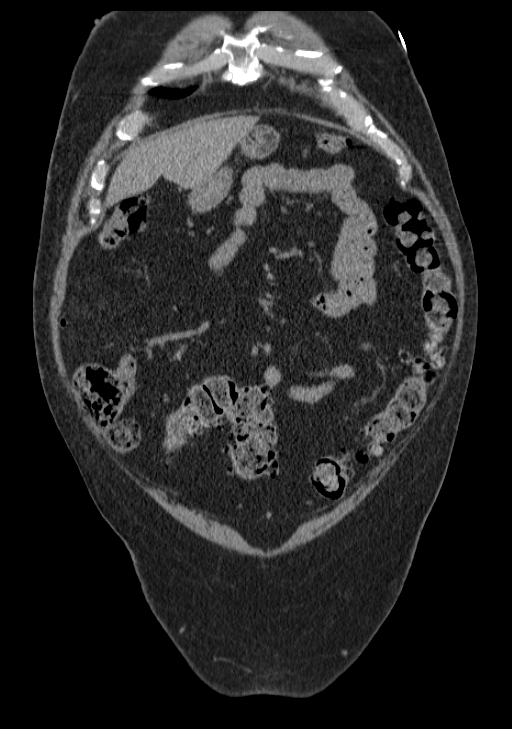
[im 92/206  soft-tissue]
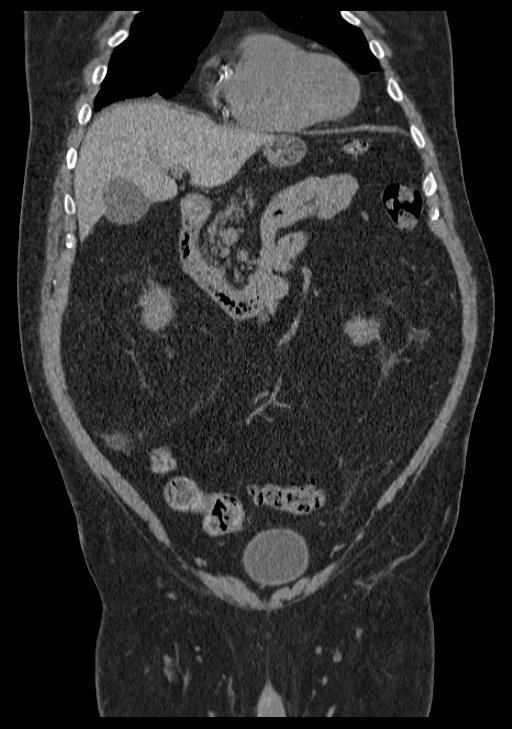
[im 114/206  soft-tissue]
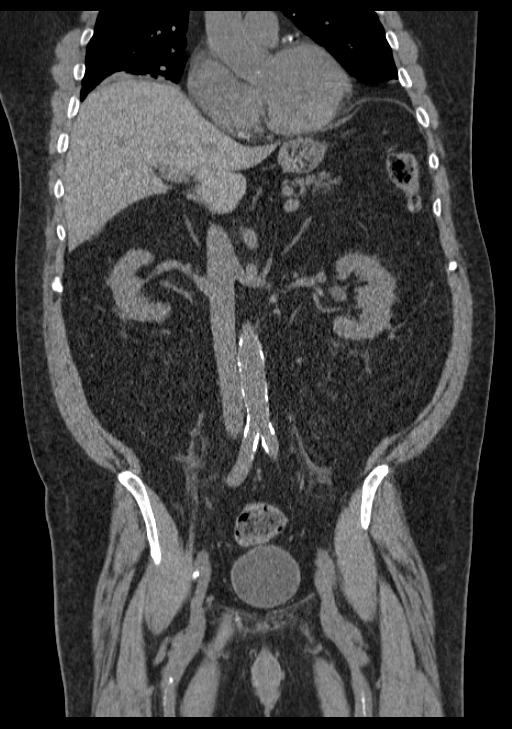

[15 of 46 positions shown; findings below may reference images not displayed]

FINDINGS: There is a small region of consolidation in the inferior aspect of
the right middle lobe. Subsegmental atelectasis is present in both
lower lobes. There is no pleural effusion. Three-vessel coronary
artery calcification is present. Calcified hilar lymph nodes are
again seen.

The liver, gallbladder, spleen, right adrenal gland, and pancreas
are unremarkable. 2.2 cm left adrenal nodule is present (previously
2.0 cm). Both kidneys are mildly lobular in contour without a
discrete mass identified on this unenhanced examination. Mild
bilateral perinephric stranding is similar to the prior study. No
hydronephrosis or renal calculi are identified. No stones are
identified along the course of the ureters, which are nondilated.

2.4 cm duodenal diverticulum is again seen. There is no evidence of
bowel obstruction. There is diverticulosis of the descending and
sigmoid colon without evidence of acute diverticulitis. Appendix is
identified in the right mid abdomen and is unremarkable.
Subcentimeter calcification in the root of the mesentery is
unchanged.

Extensive atherosclerotic vascular calcification is noted. No free
fluid or enlarged lymph nodes are identified. Bladder is
unremarkable. Prostate is normal in size. There is grade 1
anterolisthesis of L4 on L5. Prior interbody and posterior fusion
has been performed at L4-5.
IMPRESSION: 1. No acute abnormality identified in the abdomen or pelvis.
2. Colonic diverticulosis without evidence of diverticulitis.
3. Stable to minimally increased size of left adrenal nodule, most
consistent with an adenoma based on enhancement characteristics on
the prior CT.
4. Bibasilar subsegmental atelectasis. Developing infection not
excluded in the right middle lobe.

## 2016-11-14 IMAGING — CR DG CHEST 2V
2 series · 2 of 2 positions shown · non-contrast
Comparison: 06/22/2014

CLINICAL DATA: Initial encounter for shortness of breath and cough.

EXAM:
CHEST  2 VIEW

[chest pa]
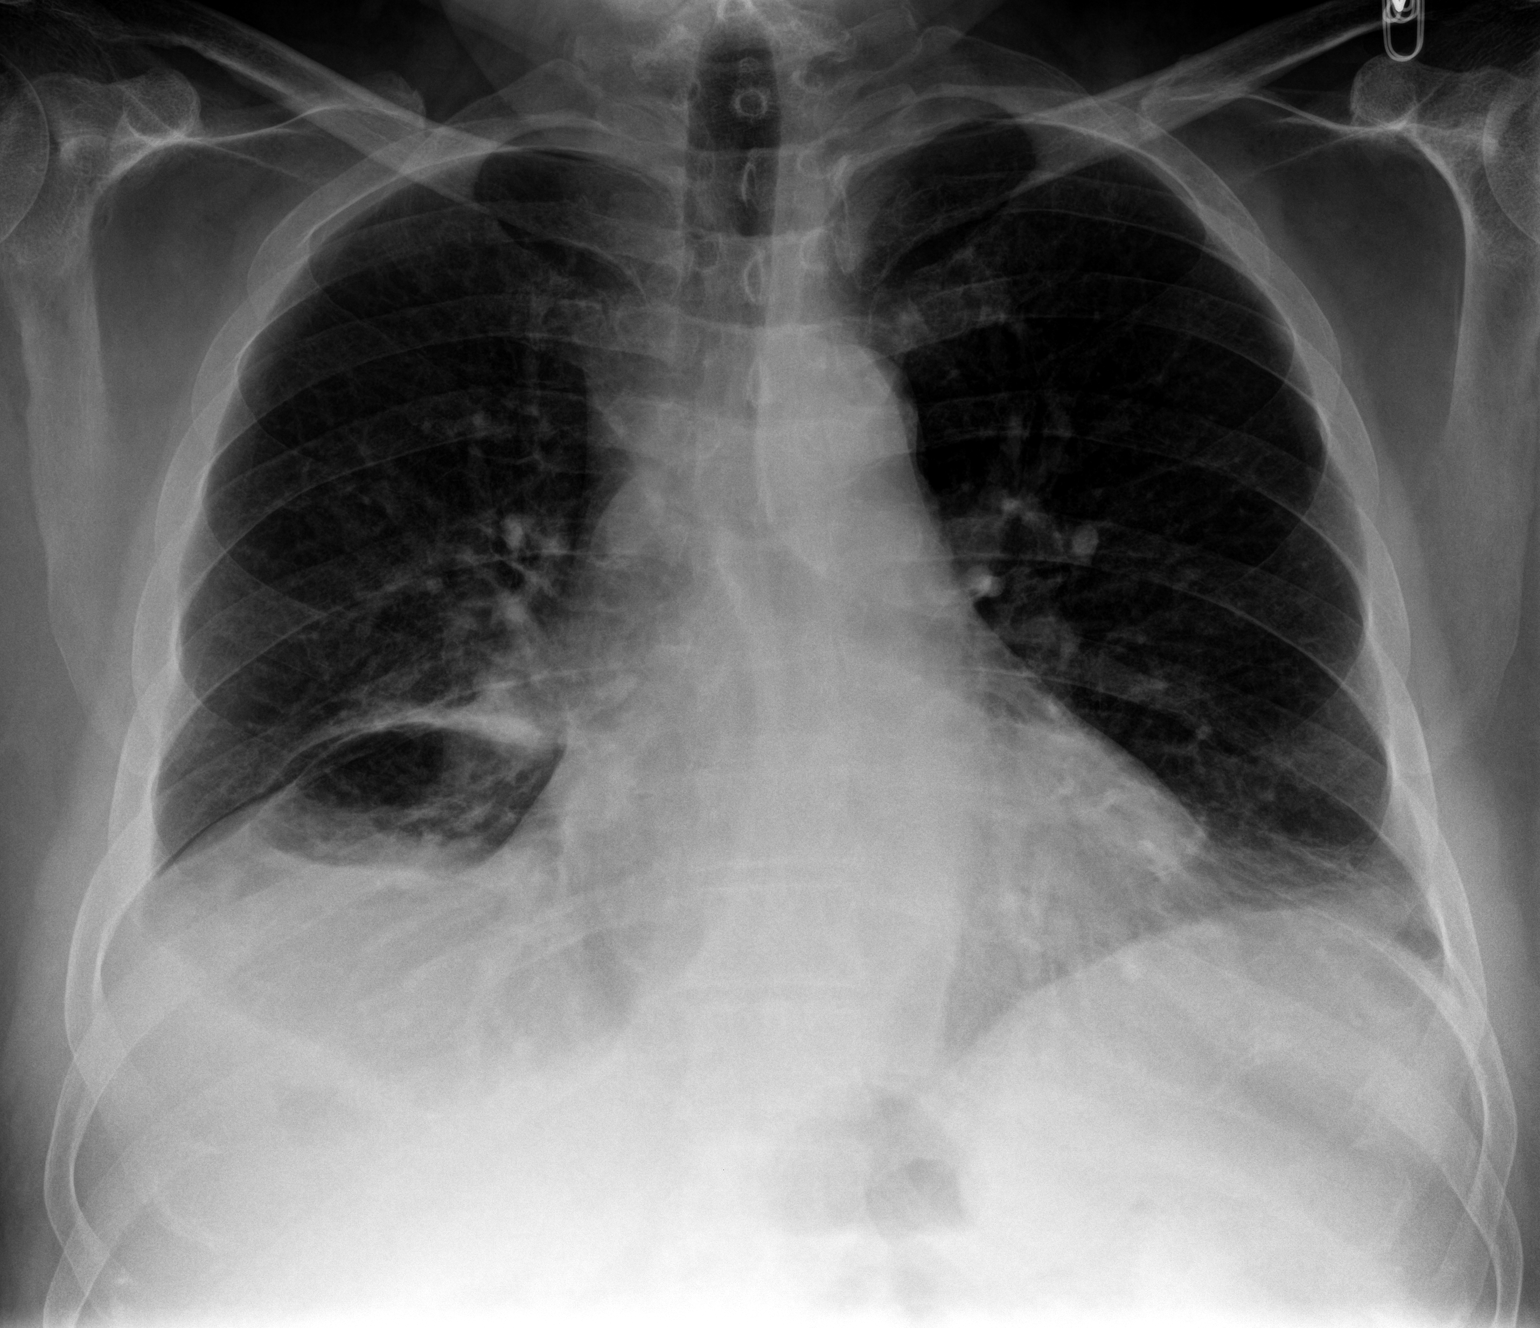

[chest lat]
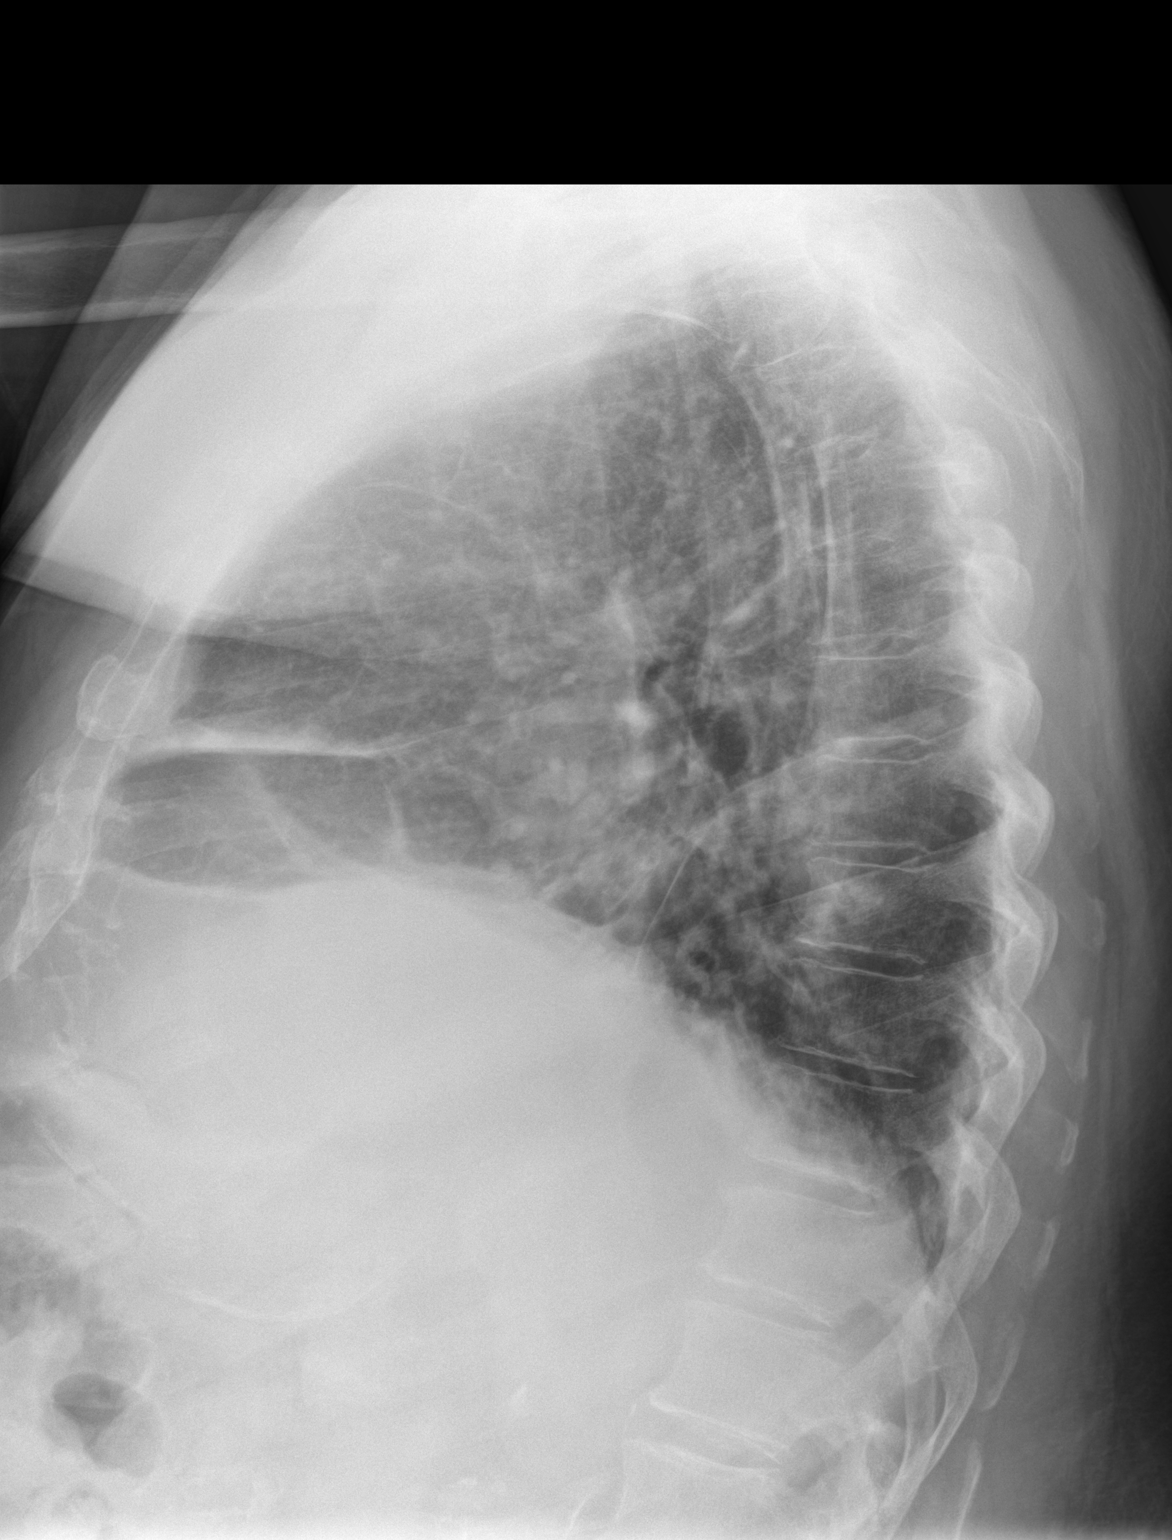

[2 of 2 positions shown; findings below may reference images not displayed]

FINDINGS: Lateral view degraded by patient arm position. Midline trachea. Mild
cardiomegaly. Mediastinal contours otherwise within normal limits.
Moderate right hemidiaphragm elevation. No pleural effusion or
pneumothorax. Bibasilar atelectasis. No congestive failure.
IMPRESSION: Low lung volumes and bibasilar atelectasis.

Cardiomegaly without congestive failure.
